# Patient Record
Sex: Female | Born: 1940 | Race: White | Hispanic: No | Marital: Single | Smoking: Never smoker
Health system: Southern US, Community
[De-identification: ages and names within clinical notes are randomized; demographics above are authoritative.]

## PROBLEM LIST (undated history)

## (undated) DIAGNOSIS — M654 Radial styloid tenosynovitis [de Quervain]: Secondary | ICD-10-CM

## (undated) DIAGNOSIS — E559 Vitamin D deficiency, unspecified: Secondary | ICD-10-CM

## (undated) DIAGNOSIS — E039 Hypothyroidism, unspecified: Secondary | ICD-10-CM

## (undated) DIAGNOSIS — F1910 Other psychoactive substance abuse, uncomplicated: Secondary | ICD-10-CM

## (undated) DIAGNOSIS — F329 Major depressive disorder, single episode, unspecified: Secondary | ICD-10-CM

## (undated) DIAGNOSIS — F32A Depression, unspecified: Secondary | ICD-10-CM

## (undated) DIAGNOSIS — K8020 Calculus of gallbladder without cholecystitis without obstruction: Secondary | ICD-10-CM

## (undated) DIAGNOSIS — I499 Cardiac arrhythmia, unspecified: Secondary | ICD-10-CM

## (undated) DIAGNOSIS — K5790 Diverticulosis of intestine, part unspecified, without perforation or abscess without bleeding: Secondary | ICD-10-CM

## (undated) DIAGNOSIS — I639 Cerebral infarction, unspecified: Secondary | ICD-10-CM

## (undated) DIAGNOSIS — C801 Malignant (primary) neoplasm, unspecified: Secondary | ICD-10-CM

## (undated) DIAGNOSIS — I10 Essential (primary) hypertension: Secondary | ICD-10-CM

## (undated) DIAGNOSIS — T8859XA Other complications of anesthesia, initial encounter: Secondary | ICD-10-CM

## (undated) DIAGNOSIS — R112 Nausea with vomiting, unspecified: Secondary | ICD-10-CM

## (undated) DIAGNOSIS — E079 Disorder of thyroid, unspecified: Secondary | ICD-10-CM

## (undated) DIAGNOSIS — E785 Hyperlipidemia, unspecified: Secondary | ICD-10-CM

## (undated) DIAGNOSIS — Z9889 Other specified postprocedural states: Secondary | ICD-10-CM

## (undated) DIAGNOSIS — Z8719 Personal history of other diseases of the digestive system: Secondary | ICD-10-CM

## (undated) HISTORY — DX: Depression, unspecified: F32.A

## (undated) HISTORY — DX: Personal history of other diseases of the digestive system: Z87.19

## (undated) HISTORY — DX: Malignant (primary) neoplasm, unspecified: C80.1

## (undated) HISTORY — DX: Essential (primary) hypertension: I10

## (undated) HISTORY — DX: Diverticulosis of intestine, part unspecified, without perforation or abscess without bleeding: K57.90

## (undated) HISTORY — DX: Radial styloid tenosynovitis (de quervain): M65.4

## (undated) HISTORY — DX: Calculus of gallbladder without cholecystitis without obstruction: K80.20

## (undated) HISTORY — DX: Disorder of thyroid, unspecified: E07.9

## (undated) HISTORY — DX: Major depressive disorder, single episode, unspecified: F32.9

## (undated) HISTORY — DX: Hyperlipidemia, unspecified: E78.5

## (undated) HISTORY — DX: Vitamin D deficiency, unspecified: E55.9

## (undated) HISTORY — DX: Other psychoactive substance abuse, uncomplicated: F19.10

## (undated) HISTORY — PX: TONSILLECTOMY: SUR1361

---
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
## 1972-11-09 HISTORY — PX: VAGINAL HYSTERECTOMY: SUR661

## 1992-11-09 HISTORY — PX: BREAST BIOPSY: SHX20

## 1992-11-09 HISTORY — PX: BREAST EXCISIONAL BIOPSY: SUR124

## 1996-09-09 ENCOUNTER — Encounter (INDEPENDENT_AMBULATORY_CARE_PROVIDER_SITE_OTHER): Payer: Self-pay | Admitting: *Deleted

## 1996-09-09 LAB — CONVERTED CEMR LAB

## 2004-07-18 ENCOUNTER — Ambulatory Visit: Payer: Self-pay | Admitting: Family Medicine

## 2004-08-20 ENCOUNTER — Encounter: Admission: RE | Admit: 2004-08-20 | Discharge: 2004-08-20 | Payer: Self-pay | Admitting: Sports Medicine

## 2005-01-19 ENCOUNTER — Ambulatory Visit: Payer: Self-pay | Admitting: Family Medicine

## 2005-11-05 ENCOUNTER — Ambulatory Visit: Payer: Self-pay | Admitting: Sports Medicine

## 2006-01-13 ENCOUNTER — Ambulatory Visit: Payer: Self-pay | Admitting: Family Medicine

## 2006-01-21 ENCOUNTER — Ambulatory Visit: Payer: Self-pay | Admitting: Family Medicine

## 2007-01-06 DIAGNOSIS — F329 Major depressive disorder, single episode, unspecified: Secondary | ICD-10-CM

## 2007-01-06 DIAGNOSIS — E039 Hypothyroidism, unspecified: Secondary | ICD-10-CM | POA: Insufficient documentation

## 2007-01-06 DIAGNOSIS — E7800 Pure hypercholesterolemia, unspecified: Secondary | ICD-10-CM | POA: Insufficient documentation

## 2007-01-07 ENCOUNTER — Encounter (INDEPENDENT_AMBULATORY_CARE_PROVIDER_SITE_OTHER): Payer: Self-pay | Admitting: *Deleted

## 2007-03-09 ENCOUNTER — Ambulatory Visit: Payer: Self-pay | Admitting: Family Medicine

## 2007-03-09 ENCOUNTER — Encounter: Payer: Self-pay | Admitting: Family Medicine

## 2007-03-09 DIAGNOSIS — J310 Chronic rhinitis: Secondary | ICD-10-CM

## 2007-03-09 LAB — CONVERTED CEMR LAB
ALT: 16 units/L (ref 0–35)
AST: 17 units/L (ref 0–37)
Albumin: 4.2 g/dL (ref 3.5–5.2)
Alkaline Phosphatase: 78 units/L (ref 39–117)
Bilirubin, Direct: 0.1 mg/dL (ref 0.0–0.3)
Indirect Bilirubin: 0.4 mg/dL (ref 0.0–0.9)
TSH: 0.22 microintl units/mL — ABNORMAL LOW (ref 0.350–5.50)
Total Bilirubin: 0.5 mg/dL (ref 0.3–1.2)
Total Protein: 6.6 g/dL (ref 6.0–8.3)

## 2007-03-10 ENCOUNTER — Encounter: Payer: Self-pay | Admitting: Family Medicine

## 2007-03-14 ENCOUNTER — Telehealth: Payer: Self-pay | Admitting: *Deleted

## 2007-03-21 ENCOUNTER — Telehealth: Payer: Self-pay | Admitting: *Deleted

## 2007-03-21 ENCOUNTER — Ambulatory Visit: Payer: Self-pay | Admitting: Family Medicine

## 2007-03-22 ENCOUNTER — Encounter: Admission: RE | Admit: 2007-03-22 | Discharge: 2007-03-22 | Payer: Self-pay | Admitting: Sports Medicine

## 2007-03-25 ENCOUNTER — Encounter: Payer: Self-pay | Admitting: Family Medicine

## 2007-05-17 ENCOUNTER — Telehealth: Payer: Self-pay | Admitting: *Deleted

## 2007-06-29 ENCOUNTER — Encounter: Payer: Self-pay | Admitting: Family Medicine

## 2007-06-29 ENCOUNTER — Ambulatory Visit: Payer: Self-pay | Admitting: Family Medicine

## 2007-06-29 LAB — CONVERTED CEMR LAB: TSH: 3.035 microintl units/mL (ref 0.350–5.50)

## 2007-07-07 ENCOUNTER — Encounter: Payer: Self-pay | Admitting: Family Medicine

## 2007-07-19 ENCOUNTER — Telehealth: Payer: Self-pay | Admitting: *Deleted

## 2007-07-27 ENCOUNTER — Telehealth: Payer: Self-pay | Admitting: Family Medicine

## 2007-08-16 ENCOUNTER — Telehealth: Payer: Self-pay | Admitting: Family Medicine

## 2007-11-28 ENCOUNTER — Telehealth: Payer: Self-pay | Admitting: Family Medicine

## 2008-04-09 ENCOUNTER — Ambulatory Visit: Payer: Self-pay | Admitting: Family Medicine

## 2008-04-09 ENCOUNTER — Telehealth: Payer: Self-pay | Admitting: *Deleted

## 2008-06-11 ENCOUNTER — Ambulatory Visit: Payer: Self-pay | Admitting: Family Medicine

## 2008-06-11 ENCOUNTER — Encounter (INDEPENDENT_AMBULATORY_CARE_PROVIDER_SITE_OTHER): Payer: Self-pay | Admitting: Family Medicine

## 2008-06-11 LAB — CONVERTED CEMR LAB
ALT: 10 units/L (ref 0–35)
AST: 14 units/L (ref 0–37)
Albumin: 4.4 g/dL (ref 3.5–5.2)
Alkaline Phosphatase: 90 units/L (ref 39–117)
BUN: 19 mg/dL (ref 6–23)
Basophils Absolute: 0 10*3/uL (ref 0.0–0.1)
Basophils Relative: 0 % (ref 0–1)
CO2: 24 meq/L (ref 19–32)
Calcium: 9.4 mg/dL (ref 8.4–10.5)
Chloride: 101 meq/L (ref 96–112)
Creatinine, Ser: 0.88 mg/dL (ref 0.40–1.20)
Eosinophils Absolute: 0.2 10*3/uL (ref 0.0–0.7)
Eosinophils Relative: 2 % (ref 0–5)
Glucose, Bld: 75 mg/dL (ref 70–99)
HCT: 43 % (ref 36.0–46.0)
Hemoglobin: 14 g/dL (ref 12.0–15.0)
Lymphocytes Relative: 14 % (ref 12–46)
Lymphs Abs: 1.5 10*3/uL (ref 0.7–4.0)
MCHC: 32.6 g/dL (ref 30.0–36.0)
MCV: 99.3 fL (ref 78.0–100.0)
Monocytes Absolute: 0.9 10*3/uL (ref 0.1–1.0)
Monocytes Relative: 9 % (ref 3–12)
Neutro Abs: 7.5 10*3/uL (ref 1.7–7.7)
Neutrophils Relative %: 75 % (ref 43–77)
Platelets: 325 10*3/uL (ref 150–400)
Potassium: 4.5 meq/L (ref 3.5–5.3)
RBC: 4.33 M/uL (ref 3.87–5.11)
RDW: 13.2 % (ref 11.5–15.5)
Sodium: 140 meq/L (ref 135–145)
Total Bilirubin: 1.1 mg/dL (ref 0.3–1.2)
Total Protein: 7.4 g/dL (ref 6.0–8.3)
WBC: 10.1 10*3/uL (ref 4.0–10.5)

## 2008-06-12 ENCOUNTER — Encounter: Admission: RE | Admit: 2008-06-12 | Discharge: 2008-06-12 | Payer: Self-pay | Admitting: Family Medicine

## 2008-06-15 ENCOUNTER — Ambulatory Visit: Payer: Self-pay | Admitting: Family Medicine

## 2008-06-27 ENCOUNTER — Ambulatory Visit: Payer: Self-pay | Admitting: Family Medicine

## 2008-06-27 DIAGNOSIS — Z8719 Personal history of other diseases of the digestive system: Secondary | ICD-10-CM

## 2008-06-27 HISTORY — DX: Personal history of other diseases of the digestive system: Z87.19

## 2008-11-12 ENCOUNTER — Telehealth (INDEPENDENT_AMBULATORY_CARE_PROVIDER_SITE_OTHER): Payer: Self-pay | Admitting: Family Medicine

## 2008-12-07 ENCOUNTER — Ambulatory Visit: Payer: Self-pay | Admitting: Family Medicine

## 2008-12-07 ENCOUNTER — Encounter: Payer: Self-pay | Admitting: Family Medicine

## 2008-12-07 DIAGNOSIS — R404 Transient alteration of awareness: Secondary | ICD-10-CM | POA: Insufficient documentation

## 2008-12-12 DIAGNOSIS — E559 Vitamin D deficiency, unspecified: Secondary | ICD-10-CM

## 2008-12-12 HISTORY — DX: Vitamin D deficiency, unspecified: E55.9

## 2008-12-13 LAB — CONVERTED CEMR LAB
BUN: 15 mg/dL (ref 6–23)
CO2: 22 meq/L (ref 19–32)
Calcium: 9.4 mg/dL (ref 8.4–10.5)
Chloride: 107 meq/L (ref 96–112)
Cholesterol: 196 mg/dL (ref 0–200)
Creatinine, Ser: 0.79 mg/dL (ref 0.40–1.20)
Glucose, Bld: 89 mg/dL (ref 70–99)
HDL: 65 mg/dL (ref >39)
LDL Cholesterol: 97 mg/dL (ref 0–99)
Potassium: 4.4 meq/L (ref 3.5–5.3)
Sodium: 141 meq/L (ref 135–145)
TSH: 1.358 microintl units/mL (ref 0.350–4.50)
Total CHOL/HDL Ratio: 3
Triglycerides: 171 mg/dL — ABNORMAL HIGH (ref <150)
VLDL: 34 mg/dL (ref 0–40)
Vit D, 1,25-Dihydroxy: 18 — ABNORMAL LOW (ref 30–89)

## 2008-12-27 ENCOUNTER — Encounter: Payer: Self-pay | Admitting: Family Medicine

## 2008-12-28 ENCOUNTER — Encounter: Admission: RE | Admit: 2008-12-28 | Discharge: 2008-12-28 | Payer: Self-pay | Admitting: Gastroenterology

## 2009-01-02 ENCOUNTER — Telehealth: Payer: Self-pay | Admitting: Family Medicine

## 2009-01-03 ENCOUNTER — Ambulatory Visit: Payer: Self-pay | Admitting: Family Medicine

## 2009-01-03 DIAGNOSIS — M67919 Unspecified disorder of synovium and tendon, unspecified shoulder: Secondary | ICD-10-CM | POA: Insufficient documentation

## 2009-01-03 DIAGNOSIS — M719 Bursopathy, unspecified: Secondary | ICD-10-CM

## 2009-02-19 ENCOUNTER — Telehealth: Payer: Self-pay | Admitting: *Deleted

## 2009-03-11 ENCOUNTER — Encounter: Payer: Self-pay | Admitting: Family Medicine

## 2009-03-11 ENCOUNTER — Ambulatory Visit: Payer: Self-pay | Admitting: Family Medicine

## 2009-03-11 DIAGNOSIS — E663 Overweight: Secondary | ICD-10-CM | POA: Insufficient documentation

## 2009-03-11 LAB — CONVERTED CEMR LAB: Vit D, 25-Hydroxy: 39 ng/mL (ref 30–89)

## 2009-03-12 ENCOUNTER — Encounter: Payer: Self-pay | Admitting: Family Medicine

## 2009-03-13 ENCOUNTER — Encounter: Payer: Self-pay | Admitting: Family Medicine

## 2009-04-10 ENCOUNTER — Telehealth: Payer: Self-pay | Admitting: *Deleted

## 2009-04-23 ENCOUNTER — Telehealth: Payer: Self-pay | Admitting: *Deleted

## 2010-01-31 ENCOUNTER — Ambulatory Visit: Payer: Self-pay | Admitting: Family Medicine

## 2010-02-01 ENCOUNTER — Telehealth: Payer: Self-pay | Admitting: Family Medicine

## 2010-02-03 ENCOUNTER — Ambulatory Visit: Payer: Self-pay | Admitting: Family Medicine

## 2010-02-03 ENCOUNTER — Encounter: Payer: Self-pay | Admitting: Family Medicine

## 2010-02-05 LAB — CONVERTED CEMR LAB
ALT: 20 units/L (ref 0–35)
AST: 19 units/L (ref 0–37)
Albumin: 4.4 g/dL (ref 3.5–5.2)
Alkaline Phosphatase: 75 units/L (ref 39–117)
BUN: 15 mg/dL (ref 6–23)
CO2: 21 meq/L (ref 19–32)
Calcium: 9.8 mg/dL (ref 8.4–10.5)
Chloride: 105 meq/L (ref 96–112)
Cholesterol: 226 mg/dL — ABNORMAL HIGH (ref 0–200)
Creatinine, Ser: 0.87 mg/dL (ref 0.40–1.20)
Glucose, Bld: 90 mg/dL (ref 70–99)
HDL: 56 mg/dL (ref >39)
LDL Cholesterol: 139 mg/dL — ABNORMAL HIGH (ref 0–99)
Potassium: 4.3 meq/L (ref 3.5–5.3)
Sodium: 139 meq/L (ref 135–145)
TSH: 4.543 microintl units/mL — ABNORMAL HIGH (ref 0.350–4.500)
Total Bilirubin: 0.9 mg/dL (ref 0.3–1.2)
Total CHOL/HDL Ratio: 4
Total Protein: 6.7 g/dL (ref 6.0–8.3)
Triglycerides: 157 mg/dL — ABNORMAL HIGH (ref <150)
VLDL: 31 mg/dL (ref 0–40)

## 2010-02-10 ENCOUNTER — Telehealth: Payer: Self-pay | Admitting: Family Medicine

## 2010-04-24 ENCOUNTER — Telehealth: Payer: Self-pay | Admitting: *Deleted

## 2010-05-14 ENCOUNTER — Encounter: Admission: RE | Admit: 2010-05-14 | Discharge: 2010-05-14 | Payer: Self-pay | Admitting: Family Medicine

## 2010-05-29 ENCOUNTER — Encounter: Payer: Self-pay | Admitting: Family Medicine

## 2010-05-29 ENCOUNTER — Ambulatory Visit: Payer: Self-pay | Admitting: Family Medicine

## 2010-05-30 LAB — CONVERTED CEMR LAB: TSH: 0.684 microintl units/mL (ref 0.350–4.500)

## 2010-06-12 ENCOUNTER — Telehealth: Payer: Self-pay | Admitting: Family Medicine

## 2010-06-20 ENCOUNTER — Telehealth: Payer: Self-pay | Admitting: Family Medicine

## 2010-07-24 ENCOUNTER — Telehealth: Payer: Self-pay | Admitting: Family Medicine

## 2010-08-05 ENCOUNTER — Ambulatory Visit: Payer: Self-pay | Admitting: Family Medicine

## 2010-08-05 DIAGNOSIS — R5381 Other malaise: Secondary | ICD-10-CM

## 2010-08-05 DIAGNOSIS — R5383 Other fatigue: Secondary | ICD-10-CM

## 2010-08-11 ENCOUNTER — Telehealth: Payer: Self-pay | Admitting: Family Medicine

## 2010-08-28 ENCOUNTER — Telehealth: Payer: Self-pay | Admitting: Family Medicine

## 2010-09-02 ENCOUNTER — Telehealth: Payer: Self-pay | Admitting: Psychology

## 2010-09-17 ENCOUNTER — Encounter: Payer: Self-pay | Admitting: Family Medicine

## 2010-09-17 ENCOUNTER — Ambulatory Visit: Payer: Self-pay | Admitting: Family Medicine

## 2010-09-17 LAB — CONVERTED CEMR LAB
Direct LDL: 118 mg/dL — ABNORMAL HIGH
TSH: 3.096 microintl units/mL (ref 0.350–4.500)

## 2010-10-20 ENCOUNTER — Ambulatory Visit: Payer: Self-pay | Admitting: Family Medicine

## 2010-11-18 ENCOUNTER — Telehealth: Payer: Self-pay | Admitting: Family Medicine

## 2010-12-02 ENCOUNTER — Encounter (INDEPENDENT_AMBULATORY_CARE_PROVIDER_SITE_OTHER): Payer: Self-pay | Admitting: *Deleted

## 2010-12-10 NOTE — Assessment & Plan Note (Signed)
Summary: f/up,tcb   Vital Signs:  Patient profile:   70 year old female Height:      65.75 inches Weight:      181.1 pounds BMI:     29.56 Temp:     98.3 degrees F oral Pulse rate:   71 / minute BP sitting:   122 / 75  (left arm) Cuff size:   regular  Vitals Entered By: Garen Grams LPN (September 17, 2010 11:05 AM) CC: f/u depression Is Patient Diabetic? No Pain Assessment Patient in pain? no        Primary Care Provider:  Antoine Primas DO  CC:  f/u depression.  History of Present Illness: Pt is here for f/u on  1. Depression: Pt states feelings of hoplesness, tearfulness, not enjoying things they used to, being more detached from family and friend, insomnia, trouble focusing, and overall felling of fatigue.  Pt has been on celexa at max dose and only a mild improvement.  Denies SI, HI Taking Medications:celexa Side Effects:no  Overall:no improvement Going to therapy: Pt has started going to see Phylliss Blakes and is doing well, see her weekly and enjoys her time there.   2. hypothyroidsim:  Pt has been on syntrhoid for a while, no side effects.  Has had a little more fatugue but not sleepying as much as last time, denies hair loss or edema, but dopes state trouble concentrating and weight gain.  3.  Hypercholesterolemia-  Pt had statin dose change and is time to check again.  Pt states no side effects, take it nightly.     Habits & Providers  Alcohol-Tobacco-Diet     Tobacco Status: never  Current Medications (verified): 1)  Celexa 20 Mg Tabs (Citalopram Hydrobromide) .... Take 1 Tablet Daily For The Next Week Then Stop. 2)  Levothroid 112 Mcg Tabs (Levothyroxine Sodium) .... One By Mouth Daily For Thyroid 3)  Zocor 40 Mg Tabs (Simvastatin) .... One By Mouth Daily For Cholesterol 4)  Adult Aspirin Low Strength 81 Mg  Tbdp (Aspirin) 5)  Caltrate 600+d Plus 600-400 Mg-Unit Tabs (Calcium Carbonate-Vit D-Min) .... 2 Tabs By Mouth Daily 6)  Venlafaxine Hcl 37.5 Mg Xr24h-Cap  (Venlafaxine Hcl) .... Take 2 Tabs By Mouth Daily For The Next Week Then 4 Tabs Daily Thereafter.  Allergies (verified): No Known Drug Allergies  Past History:  Past medical, surgical, family and social histories (including risk factors) reviewed, and no changes noted (except as noted below).  Past Medical History: Reviewed history from 01/03/2009 and no changes required. Depression dx`ed 1990; tried Zoloft initially,  Diverticulosis w/ h/o diverticulitis 1998, H/o cholelithiasis, Hiatal hernia incidentally found on CT (2/98) Diverticulitis 8/09  colonoscopy 2/10 - one sessile polyp removed - plan repeat scope in 5 years.   had bone density scan per her report with [facility] weight study in 2007...states this was normal.  Past Surgical History: Reviewed history from 01/06/2007 and no changes required. Appendectomy (1952) -, Breast biopsy: no malignancy - 07/10/1993, Tonsillectomy (?1946) -, TSH = 0.472; FBS = 74; Cr = 0.9 - 01/21/2005, Vag hysterectomy for fibroids (1975) -  Family History: Reviewed history from 01/31/2010 and no changes required. Brother--3 years younger; Hep C, smoker, Daughter--hypoglycemia, Father--died at 67; smoker; alcoholic; CVAs, MIs when elderly, Maternal aunt--DM, Mother--died at 22 of CHF; smoker; alcoholic, Son--alcoholic, Uncle--suicide son with congenital hyperbilirubinemia. PGM with ? 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  Social History: Reviewed history from 05/29/2010 and no changes required. 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  Eyes:  PERRL, EOMI.  Mouth:  oropharynx pink, moist; no erythema or exudate  Lungs:  work of breathing unlabored, clear to auscultation bilaterally; no wheezes, rales, or ronchi; good air movement throughout  Heart:  regular rate and rhythm, no murmurs; normal s1/s2  Abdomen:  +BS, soft, non-tender, non-distended; no masses; no rebound or guarding  Msk:  no joint swelling; no crepitus with active/passive movement of knees. Transitions are easy and smooth Pulses:  2+ DP and radial pulses Extremities:  no cyanosis, clubbing, or edema  Neurologic:  alert and oriented. speech normal. station and gait normal. no gross deficitis.    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  Will titrate up while titrate down celexa at same time.  Pt understands.  Pt wants to wait to get med from [facility] and then will startr new regimen. Pt is to see me again 3 weeks after starting regimen, gave red flags to look out for.  Her updated medication list for this problem includes:    Celexa 20 Mg Tabs (Citalopram hydrobromide) .Marland Kitchen... Take 1 tablet daily for the next week then stop.    Venlafaxine Hcl 37.5 Mg Xr24h-cap (Venlafaxine hcl) .Marland Kitchen... Take 2 tabs by mouth daily for the next week then 4 tabs daily thereafter.  Orders: [facility]- Est  Level 4 (91478)  Problem # 2:  HYPOTHYROIDISM, UNSPECIFIED (ICD-244.9) will check TSh.  Her updated medication list for this problem includes:    Levothroid 112 Mcg Tabs (Levothyroxine sodium) ..... One by mouth daily for thyroid  Orders: [facility]- Est  Level 4  (99214) TSH-[facility] (29562-13086)  Problem # 3:  HYPERCHOLESTEROLEMIA (ICD-272.0) will get direct LDL. liekly at proper dose.  Her updated medication list for this problem includes:    Zocor 40 Mg Tabs (Simvastatin) ..... One by mouth daily for cholesterol  Orders: [facility]- Est  Level 4 (57846) Direct LDL-[facility] (96295-28413)  Complete Medication List: 1)  Celexa 20 Mg Tabs (Citalopram hydrobromide) .... Take 1 tablet daily for the next week then stop. 2)  Levothroid 112 Mcg Tabs (Levothyroxine sodium) .... One by mouth daily for thyroid 3)  Zocor 40 Mg Tabs (Simvastatin) .... One by mouth daily for cholesterol 4)  Adult Aspirin Low Strength 81 Mg Tbdp (Aspirin) 5)  Caltrate 600+d Plus 600-400 Mg-unit Tabs (Calcium carbonate-vit d-min) .... 2 tabs by mouth daily 6)  Venlafaxine Hcl 37.5 Mg Xr24h-cap (Venlafaxine hcl) .... Take 2 tabs by mouth daily for the next week then 4 tabs daily thereafter.  Patient Instructions: 1)  I am glad you are doing a little better but it seems you are not too happy with the celexa. 2)  We will switch you to effexor.  Take 2  tabs (75mg ) daily for the next week then take 4 tablets (150mg )  thereafter. 3)  I want you to take just one celexa daily for the next week, then stop completely. 4)  We will check your LDL cholesterol and thyroid today I will call you with the results.  5)  I need to see you again in 3-4 weeks.   Prescriptions: VENLAFAXINE HCL 37.5 MG XR24H-CAP (VENLAFAXINE HCL) take 2 tabs by mouth daily for the next week then 4 tabs daily thereafter.  #124 x 1   Entered and Authorized by:   Antoine Primas DO   Signed by:   Antoine Primas DO on 09/17/2010   Method used:   Faxed to ...       [facility] (mail-order)             , [place]         Ph: [phone]       Fax: [phone]   RxID:   [id number]    Orders Added: 1)  [facility]- Est  Level 4 [01027] 2)  Direct LDL-[facility] [[id number]] 3)  TSH-[facility] [[id number]]

## 2010-12-10 NOTE — Assessment & Plan Note (Signed)
Summary: increase meds?,df   Vital Signs:  Patient profile:   70 year old female Height:      65.75 inches Weight:      183 pounds BMI:     29.87 Temp:     98.3 degrees F oral Pulse rate:   67 / minute BP sitting:   141 / 89  (left arm) Cuff size:   regular  Vitals Entered By: Garen Grams LPN (August 05, 2010 9:24 AM) CC: change meds Is Patient Diabetic? No Pain Assessment Patient in pain? no        Primary Care Provider:  Antoine Primas DO  CC:  change meds.  History of Present Illness: 70 yo female here for f/u with multiple problems  1 Depression-  Pt has been on Celexa since joining our practice and has been doing very well but since last visit and decreasing dose to 20 mg seems to be much more depressed.  Pt really wanted to be off medication but ddoes not think it is possible at this time. Pt states she has been spending a lot more time in bed recently and not doing the household chores like she usually does.  She always says to herself tat she will do them later. Pt always knows when she is feeling depressed because she sleep a lot more. Pt denies SI HI.  Pt will from time to tim have overwhelming feeling of sadness but doesn't cry much.    2. Overweight-  Has not ben working out due to pt not feeling good and sad.  Pt has not been going to the [facility].  Pt would like to lose some weight and thinks if we can help her mood she will do better.     Habits & Providers  Alcohol-Tobacco-Diet     Tobacco Status: never  Current Medications (verified): 1)  Celexa 20 Mg Tabs (Citalopram Hydrobromide) .... Take 2 Tablet By Mouth Daily 2)  Levothroid 112 Mcg Tabs (Levothyroxine Sodium) .... One By Mouth Daily For Thyroid 3)  Zocor 40 Mg Tabs (Simvastatin) .... One By Mouth Daily For Cholesterol 4)  Adult Aspirin Low Strength 81 Mg  Tbdp (Aspirin) 5)  Caltrate 600+d Plus 600-400 Mg-Unit Tabs (Calcium Carbonate-Vit D-Min) .... 2 Tabs By Mouth Daily  Allergies (verified): No  Known Drug Allergies  Review of Systems       see hpi denies fever, chills, nausea, vomiting, diarrhea or constipation   Physical Exam  General:  Vital signs reviewed -- overweight but otherwise normal Alert, appropriate; well-dressed and well-nourished  tearful Eyes:  PERRL, EOMI,some erythema of the conjunctiva due to tearfulness.  Mouth:  oropharynx pink, moist; no erythema or exudate  Lungs:  work of breathing unlabored, clear to auscultation bilaterally; no wheezes, rales, or ronchi; good air movement throughout  Heart:  regular rate and rhythm, no murmurs; normal s1/s2  Abdomen:  +BS, soft, non-tender, non-distended; no masses; no rebound or guarding  Pulses:  2+ DP and radial pulses Extremities:  no cyanosis, clubbing, or edema  Psych:  good insight sadness, poor body language, does seem to have some concentration problem which is new for pt.    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 Pt does not have  any suicidal ideation, Homicidal ideation ideation does have family hx of a uncle who comitted suicide and states she  would never do that to her family but in the same breath states that she does not have any close family.   this time of year always seems hard for her. Will monitor closely told her that if she had any ideas of hurting herself to return immediately. If pt is not better at next visit would change her to effexor and titrate up to a sizeable dose. Do feel there is a anxxiety component to it. Pt was given Dr. Carola Rhine card if she wants to make an appointment. Pt stated she did ot think she would at this time.  Her updated medication list for this problem includes:    Celexa 20 Mg Tabs (Citalopram hydrobromide) .Marland Kitchen... Take 2 tablet by mouth daily  Orders: [facility]- Est  Level 4 (62952)  Problem # 2:  OVERWEIGHT  (ICD-278.02) Likely due to the depression haas not felt liek doing anything. Will montior but not main issue today. Effexor may be a better otpion as well . Orders: [facility]- Est  Level 4 (99214)  Problem # 3:  FATIGUE (ICD-780.79)  Complete Medication List: 1)  Celexa 20 Mg Tabs (Citalopram hydrobromide) .... Take 2 tablet by mouth daily 2)  Levothroid 112 Mcg Tabs (Levothyroxine sodium) .... One by mouth daily for thyroid 3)  Zocor 40 Mg Tabs (Simvastatin) .... One by mouth daily for cholesterol 4)  Adult Aspirin Low Strength 81 Mg Tbdp (Aspirin) 5)  Caltrate 600+d Plus 600-400 Mg-unit Tabs (Calcium carbonate-vit d-min) .... 2 tabs by mouth daily  Patient Instructions: 1)  I wnat you to increase your celexa to 2 pills daily 2)  I want to see you again in 2 weeks 3)  If you need anything before that please do not hesitate to call.  Prescriptions: CELEXA 20 MG TABS (CITALOPRAM HYDROBROMIDE) take 2 tablet by mouth daily  #62 x 3   Entered and Authorized by:   Antoine Primas DO   Signed by:   Antoine Primas DO on 08/05/2010   Method used:   Faxed to ...       [facility] (mail-order)       [address]       [place], [place]  [address]       Ph: [phone]       Fax: [phone]   RxID:   [id number]

## 2010-12-10 NOTE — Assessment & Plan Note (Signed)
Summary: cpe,df   Vital Signs:  Patient profile:   70 year old female Height:      65.75 inches Weight:      184.1 pounds BMI:     30.05 Temp:     98.2 degrees F oral Pulse rate:   65 / minute BP sitting:   131 / 79  (right arm) Cuff size:   regular  Vitals Entered By: Garen Grams LPN (January 31, 2010 9:28 AM) CC: CPE Is Patient Diabetic? No Pain Assessment Patient in pain? no        Primary Care Provider:  Myrtie Soman  MD  CC:  CPE.  History of Present Illness: 1. depression States that mood is doing well. No SI. Has had several occurences of cancer diagnosis among her family and friends -- all either friends or in-laws. States that this has been hard but she is dealing with them okay.   2. vision States that her night vision problems are resolved. Had a normal eye exam by [facility].  3. prevention Weight is up 5 lbs. Plans to start working out and eating better. Admits that she likes to eat and doesn't like to exercise. But she is willing to try to improve this.   Due for mammogram -- willing to get this.  Had dexa scan in 2004. Was told that it was normal. Would be willing to get another one. Has not been taking Ca/Vit D regularly.  Has not been taking ASA 81 mg regularly.  Pt has 112 micrograms synthroid on her list -- we have 100 micrograms on ours -- she plans to check this and call us back with the right dose.   Habits & Providers  Alcohol-Tobacco-Diet     Tobacco Status: never  Current Medications (verified): 1)  Celexa 20 Mg Tabs (Citalopram Hydrobromide) .... Take 1 Tablet By Mouth Once A Day 2)  Levothroid 100 Mcg  Tabs (Levothyroxine Sodium) 3)  Zocor 20 Mg Tabs (Simvastatin) .Marland Kitchen.. 1 Tablet By Mouth At Bedtime 4)  Adult Aspirin Low Strength 81 Mg  Tbdp (Aspirin) 5)  Caltrate 600+d Plus 600-400 Mg-Unit Tabs (Calcium Carbonate-Vit D-Min) .... 2 Tabs By Mouth Daily  Allergies (verified): No Known Drug Allergies  Family History: Brother--3  years younger; Hep C, smoker, Daughter--hypoglycemia, Father--died at 17; smoker; alcoholic; CVAs, MIs when elderly, Maternal aunt--DM, Mother--died at 11 of CHF; smoker; alcoholic, Son--alcoholic, Uncle--suicide son with congenital hyperbilirubinemia. PGM with ? 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  Review of Systems  The patient denies anorexia, fever, weight loss, vision loss, chest pain, syncope, dyspnea on exertion, headaches, and abdominal pain.         no nausea, vomting, or change in bowel habits. No joint swelling or joint aches.   Physical Exam  General:  Vital signs reviewed -- overweight but otherwise normal Alert, appropriate; well-dressed and well-nourished  Eyes:  PERRL, EOMI, fundoscopic exam unremarkable  Mouth:  oropharynx pink, moist; no erythema or exudate  Neck:  no carotid bruits, no JVD, no tenderness or masses  Lungs:  work of breathing unlabored, clear to auscultation bilaterally; no wheezes, rales, or ronchi; good air movement throughout  Heart:  regular rate and rhythm, no murmurs; normal s1/s2  Abdomen:  +BS, soft, non-tender, non-distended; no masses; no rebound or guarding  Genitalia:  pt defers Msk:  no joint swelling; no crepitus with active/passive movement of knees. Transitions  are easy and smooth Pulses:  2+ DP and radial pulses Extremities:  no cyanosis, clubbing, or edema  Neurologic:  alert and oriented. speech normal. station and gait normal. no gross deficitis.  Skin:  warm, good turgor; no rashes or lesions. brisk cap refill  Psych:  alert and oriented. full affect, normally interactive. Good eye contact.    Impression & Recommendations:  Problem # 1:  Preventive Health Care (ICD-V70.0) Assessment Unchanged  Pt doing well. Pt to call to schedule mammogram and dexa scan. Advised restarted ASA 81 mg and Ca/Vit D regularly. Have  back for routine labs (lipids, CMET, TSH). Pt to call about her thyroid dose. She has 112 micrograms and my records show 100 micrograms. Encouraged more exercise and weight loss.   Overall doing quite well.   Orders: [facility] - Est  65+ [phone])  Complete Medication List: 1)  Celexa 20 Mg Tabs (Citalopram hydrobromide) .... Take 1 tablet by mouth once a day 2)  Levothroid 100 Mcg Tabs (Levothyroxine sodium) 3)  Zocor 20 Mg Tabs (Simvastatin) .Marland Kitchen.. 1 tablet by mouth at bedtime 4)  Adult Aspirin Low Strength 81 Mg Tbdp (Aspirin) 5)  Caltrate 600+d Plus 600-400 Mg-unit Tabs (Calcium carbonate-vit d-min) .... 2 tabs by mouth daily  Other Orders: Future Orders: Comp Met-[facility] ([id number]) ... 02/01/2011 Lipid-[facility] ([id number]) ... 02/01/2011 TSH-[facility] [id number]) ... 02/01/2011  Patient Instructions: 1)  start taking Aspirin 81 mg daily  2)  start calcium/vitamin D 400/600 mg (2 tablets a day) 3)  let us know about the synthroid dose 4)  I would recommend a bone density scan and a mammogram 5)  Call with any questions or concerns

## 2010-12-10 NOTE — Progress Notes (Signed)
Summary: Mental health referral  Phone Note Call from Patient   Caller: Patient Call For: Spero Geralds, Psy.D. Summary of Call: Patient called to schedule an appt.  She is only able to come in the morning because she picks her granddaughter up from school in the afternoon.  Discussed options.  Elected to accept a referral to [facility] [phone]).  Would encourage communication between [facility] and Dr. Katrinka Blazing via a release of information. Initial call taken by: Spero Geralds PsyD,  September 02, 2010 1:25 PM

## 2010-12-10 NOTE — Progress Notes (Signed)
Summary: meds change  Phone Note Refill Request Call back at Home Phone [phone] Message from:  Patient  Refills Requested: Medication #1:  CELEXA 20 MG TABS take 1 tablet by mouth daily   Notes: pt feels like she needs to increase to 30 mg- has about a month supply and would like to increase at next refill [facility]  Initial call taken by: De Nurse,  July 24, 2010 8:59 AM  Follow-up for Phone Call        sorry but needs to be seen if she feels that her depression is getting worse. Please call pt and tell her to make an appointment.  Follow-up by: Antoine Primas DO,  July 24, 2010 9:46 AM  Additional Follow-up for Phone Call Additional follow up Details #1::        LVM for pt to call back. Additional Follow-up by: De Nurse,  July 29, 2010 2:16 PM    Additional Follow-up for Phone Call Additional follow up Details #2::    LM for pt telling her would like to see her before changing medication. Follow-up by: Antoine Primas DO,  July 30, 2010 8:24 AM

## 2010-12-10 NOTE — Progress Notes (Signed)
Summary: needs orders  Phone Note Call from Patient   Caller: Patient Summary of Call: needs orders for bone density at [facility] - already has appt 7/6 @ 1:20 Initial call taken by: De Nurse,  April 24, 2010 9:31 AM  Follow-up for Phone Call        will forward  to MD. Follow-up by: Theresia Lo RN,  April 24, 2010 9:36 AM  Additional Follow-up for Phone Call Additional follow up Details #1::        I don't  think I usually put in an order for this but will do so. Please fax over to breast center and advise patient that this has been done. thanks. Additional Follow-up by: Myrtie Soman  MD,  April 30, 2010 8:56 AM  New Problems: ? OSTEOPOROSIS (ICD-733.00)   Additional Follow-up for Phone Call Additional follow up Details #2::    Order faxed, patient informed. Follow-up by: Garen Grams LPN,  April 30, 2010 10:58 AM  New Problems: ? OSTEOPOROSIS (ICD-733.00)

## 2010-12-10 NOTE — Progress Notes (Signed)
Summary: Rx Prob  Phone Note Call from Patient Call back at Home Phone [phone]   Caller: Patient Summary of Call: Pt states that she was to have 3 rxs sent to [facility] and she is checking to see if all three were sent.   Initial call taken by: Clydell Hakim,  June 12, 2010 2:46 PM  Follow-up for Phone Call        patient states she is to get  90 day supply of simvastatin because costs less if she gets a 90 day supply. will ask MD to resend.  also advised that we have sent in two meds . she states the 3rd was celexa and she actually has gotten this med.  she has not received the rx sent 06/05/2010. will call tomorrow to ask about.  Follow-up by: Theresia Lo RN,  June 12, 2010 5:41 PM  Additional Follow-up for Phone Call Additional follow up Details #1::        called  mail order pharmacy and the rx have been received and shipped. will ask MD about simvastatin to ask if he will ok #90 day supply for next time she needs refilled. Additional Follow-up by: Theresia Lo RN,  June 13, 2010 2:07 PM    Additional Follow-up for Phone Call Additional follow up Details #2::    sent in rx Follow-up by: Antoine Primas DO,  June 14, 2010 8:10 AM  Prescriptions: ZOCOR 40 MG TABS (SIMVASTATIN) one by mouth daily for cholesterol  #91 Tablet x 1   Entered and Authorized by:   Antoine Primas DO   Signed by:   Antoine Primas DO on 06/14/2010   Method used:   Faxed to ...       [facility] (mail-order)             , [place]         Ph: [phone]       Fax: [phone]   RxID:   [id number]

## 2010-12-10 NOTE — Assessment & Plan Note (Signed)
Summary: SHINGLES VACCINE/KH  Nurse Visit  patient in today for shingles vaccine . explained  to patient however that she had a shingles vaccine   12/07/2009. she does not remember this. explained that it is charted with lot # and expiration date for the vaccine. Theresia Lo RN  February 03, 2010 9:45 AM   Allergies: No Known Drug Allergies  Orders Added: 1)  No Charge Patient Arrived (NCPA0) [NCPA0]  Appended Document: SHINGLES VACCINE/KH the shingles vaccine  date above was entered incorrectly. it was given 12/07/2008 and not 12/07/2009.

## 2010-12-10 NOTE — Assessment & Plan Note (Signed)
Summary: Hypothyroidism, overwieght, depression   Vital Signs:  Patient profile:   70 year old female Height:      65.75 inches Weight:      184 pounds BMI:     30.03 BSA:     1.93 Temp:     98.4 degrees F Pulse rate:   69 / minute BP sitting:   136 / 89  Vitals Entered By: Jone Baseman CMA (May 29, 2010 9:40 AM) CC: f/u bloodwork Is Patient Diabetic? No Pain Assessment Patient in pain? no        Primary Care Provider:  Antoine Primas DO  CC:  f/u bloodwork.  History of Present Illness: 70 yo female here for f/u with multiple problems  1.  Hypothyroid-  pt has been taking synthroid for quite awhile.  Pt states that she went up on he rdose last time they checked her TSH.  Pt states that since then she has been doing relatively well with increase in energy but denies any anxiety, diarrhea, hair loss cp, sob. Pt  states she thinks it is a good dose.   2.  Depression-  Pt has been on Celexa since joining our practice and has been doing very well denies any hoplessness, eating troubles, excessive sleepiness, and staes she never really got ssad when she was depressed only fatigue and not wanting to do things she used to enjoy. Pt htough now is feeling great and is wondering if we can attempt to come off the medicine.  Pt would like to at least come down in dosing some eventhough she thinks the medicine is very well tolerated.   3.  Hypercholesterolemia-  On statin doing well checked last in 3/11 with LDL of 139.  No hx of DM.  Pt was at goal  4. Overweight-  pt states that for the last 2 weeks has been working out daily at [employer] and enjoying it.  Felels that if she gets her weight under control maybe she can stop some of her medications which is a goal for her. wt today 184.    Habits & Providers  Alcohol-Tobacco-Diet     Tobacco Status: never  Current Medications (verified): 1)  Celexa 20 Mg Tabs (Citalopram Hydrobromide) .... Take 1 Tablet By Mouth Daily 2)  Levothroid  112 Mcg Tabs (Levothyroxine Sodium) .... One By Mouth Daily For Thyroid 3)  Zocor 40 Mg Tabs (Simvastatin) .... One By Mouth Daily For Cholesterol 4)  Adult Aspirin Low Strength 81 Mg  Tbdp (Aspirin) 5)  Caltrate 600+d Plus 600-400 Mg-Unit Tabs (Calcium Carbonate-Vit D-Min) .... 2 Tabs By Mouth Daily  Allergies (verified): No Known Drug Allergies  Past History:  Past medical, surgical, family and social histories (including risk factors) reviewed, and no changes noted (except as noted below).  Past Medical History: Reviewed history from 01/03/2009 and no changes required. Depression dx`ed 1990; tried Zoloft initially,  Diverticulosis w/ h/o diverticulitis 1998, H/o cholelithiasis, Hiatal hernia incidentally found on CT (2/98) Diverticulitis 8/09  colonoscopy 2/10 - one sessile polyp removed - plan repeat scope in 5 years.   had bone density scan per her report with [facility] weight study in 2007...states this was normal.  Past Surgical History: Reviewed history from 01/06/2007 and no changes required. Appendectomy (1952) -, Breast biopsy: no malignancy - 07/10/1993, Tonsillectomy (?1946) -, TSH = 0.472; FBS = 74; Cr = 0.9 - 01/21/2005, Vag hysterectomy for fibroids (1975) -  Family History: Reviewed history from 01/31/2010 and no changes required. Brother--3  years younger; Hep C, smoker, Daughter--hypoglycemia, Father--died at 29; smoker; alcoholic; CVAs, MIs when elderly, Maternal aunt--DM, Mother--died at 101 of CHF; smoker; alcoholic, Son--alcoholic, Uncle--suicide son with congenital hyperbilirubinemia. PGM with ? 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  Social History: Reviewed history from 12/07/2008 and no changes required. 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); -Denies tobacco use; -Drinks 2 glasses of Chardonnay 4 days/wk + 1 cocktail q mos; started exercising more -Protestant  Review of Systems       denies fever, chills, nausea, vomiting, diarrhea or constipation   Physical Exam  General:  Vital signs reviewed -- overweight but otherwise normal Alert, appropriate; well-dressed and well-nourished  Eyes:  PERRL, EOMI, fundoscopic exam unremarkable  Mouth:  oropharynx pink, moist; no erythema or exudate  Lungs:  work of breathing unlabored, clear to auscultation bilaterally; no wheezes, rales, or ronchi; good air movement throughout  Heart:  regular rate and rhythm, no murmurs; normal s1/s2  Abdomen:  +BS, soft, non-tender, non-distended; no masses; no rebound or guarding  Msk:  no joint swelling; no crepitus with active/passive movement of knees. Transitions are easy and smooth Pulses:  2+ DP and radial pulses Extremities:  no cyanosis, clubbing, or edema  Neurologic:  alert and oriented. speech normal. station and gait normal. no gross deficitis.    Impression & Recommendations:  Problem # 1:  HYPOTHYROIDISM, UNSPECIFIED (ICD-244.9)  Will check TSH again today to see if we had improvement.  Likely also root issue of pt cholesterol.  Pt seems to have good energy doing well, no side effects.  Will titrate as needed  Her updated medication list for this problem includes:    Levothroid 112 Mcg Tabs (Levothyroxine sodium) ..... One by mouth daily for thyroid    Her updated medication list for this problem includes:    Levothroid 112 Mcg Tabs (Levothyroxine sodium) ..... One by mouth daily for thyroid  Orders: [facility]SH-[facility] (13244-01027) [facility]- Est  Level 4 (25366)  Labs Reviewed: TSH: 4.543 (02/03/2010)    Chol: 226 (02/03/2010)   HDL: 56 (02/03/2010)   LDL: 139 (02/03/2010)   TG: 157 (02/03/2010)  Problem # 2:  OVERWEIGHT  (ICD-278.02) Pt is 184 today Goal for next visit in 2 months s 180 or less for sure. Pt is motivated working out talked about food choices. Talked about ETOH again and how there are empty calories as well.  Orders: [facility]- Est  Level 4 ([address]214)  Problem # 3:  HYPERCHOLESTEROLEMIA (ICD-272.0) Doing well, if continue to lose weight will check again in 6 months and see if can titrate medicine down per pt wishes Her updated medication list for this problem includes:    Zocor 40 Mg Tabs (Simvastatin) ..... One by mouth daily for cholesterol  Labs Reviewed: SGOT: 19 (02/03/2010)   SGPT: 20 (02/03/2010)   HDL:56 (02/03/2010), 65 (12/07/2008)  LDL:139 (02/03/2010), 97 (44/01/4741)  Chol:226 (02/03/2010), 196 (12/07/2008)  Trig:157 (02/03/2010), 171 (12/07/2008)  Problem # 4:  DEPRESSIVE DISORDER, NOS (ICD-311) Will decrease back to one tab daily see if any signs of depression occur again.  If not will continue to titrae as tolerated.  Pt also would like to discontinue this medicine if possible in  the future.  Her updated medication list for this problem includes:    Celexa 20 Mg Tabs (Citalopram hydrobromide) .Marland Kitchen... Take 1 tablet by mouth daily  Orders: [facility]- Est  Level 4 ([address]214)  Complete Medication List: 1)  Celexa 20 Mg Tabs (Citalopram hydrobromide) .... Take 1 tablet by mouth daily 2)  Levothroid 112 Mcg Tabs (Levothyroxine sodium) .... One by mouth daily for thyroid 3)  Zocor 40 Mg Tabs (Simvastatin) .... One by mouth daily for cholesterol 4)  Adult Aspirin Low Strength 81 Mg Tbdp (Aspirin) 5)  Caltrate 600+d Plus 600-400 Mg-unit Tabs (Calcium carbonate-vit d-min) .... 2 tabs by mouth daily  Patient Instructions: 1)  Nice to meet you 2)  I am going to check your thyroid today.  I will call you with the results. 3)  I want you to decrease your celexa to 1 pill daily 4)  I want you to continue to exercise, keep up the good work.  5)  Try taking the calcium and the aspirin daily.  I don not  think any interaction with your other medication is of concern. 6)  I want to see you again in 3-4 months.  Prescriptions: CELEXA 20 MG TABS (CITALOPRAM HYDROBROMIDE) take 1 tablet by mouth daily  #90 x 0   Entered and Authorized by:   Antoine Primas DO   Signed by:   Antoine Primas DO on 05/29/2010   Method used:   Historical   RxID:   [id number]

## 2010-12-10 NOTE — Progress Notes (Signed)
Summary: Rx Req  Phone Note Refill Request Call back at Home Phone [phone] Message from:  Patient  Refills Requested: Medication #1:  LEVOTHROID 112 MCG TABS one by mouth daily for thyroid   Dosage confirmed as above?Dosage Confirmed   Brand Name Necessary? No   Supply Requested: 3 months   Last Refilled: 06/20/2010 NEEDS THIS FAXED TO [facility] THE PHONE NUMBER [phone]    Initial call taken by: Clydell Hakim,  June 20, 2010 8:37 AM Caller: Patient    Prescriptions: LEVOTHROID 112 MCG TABS (LEVOTHYROXINE SODIUM) one by mouth daily for thyroid  #90 Tablet x 1   Entered and Authorized by:   Antoine Primas DO   Signed by:   Antoine Primas DO on 06/20/2010   Method used:   Faxed to ...       [facility] (mail-order)             , [place]         Ph: [phone]       Fax: [phone]   RxID:   [id number]

## 2010-12-10 NOTE — Progress Notes (Signed)
----   Converted from flag ---- ---- 01/31/2010 3:11 PM, Bradly Bienenstock wrote: Ms. Runquist wanted me to let you know that you were right about the Synthroid.  It is .  Olegario Messier H ------------------------------

## 2010-12-10 NOTE — Progress Notes (Signed)
Summary: phn msg  Phone Note Call from Patient Call back at Home Phone [phone]   Caller: Patient Summary of Call: Can Dr. Katrinka Blazing call her about her f/up appt she is to have. Initial call taken by: Clydell Hakim,  August 11, 2010 10:12 AM  Follow-up for Phone Call        called pt no answer will try again later Follow-up by: Antoine Primas DO,  August 12, 2010 10:25 AM  Additional Follow-up for Phone Call Additional follow up Details #1::        attempted to call again lft message  Additional Follow-up by: Antoine Primas DO,  August 12, 2010 4:02 PM

## 2010-12-10 NOTE — Progress Notes (Signed)
Summary: Shingles Ques  Phone Note Call from Patient Call back at Home Phone [phone]   Caller: Patient Summary of Call: Pt says we tell her that she has had the shingle shot, but she is not confident with this is there a blood test that can be done to prove she has had the shingles shot or can she have another one on top of one? Initial call taken by: Clydell Hakim,  February 10, 2010 9:08 AM  Follow-up for Phone Call        will send message to PCP to please advise. It is documented that patient had shingles vaccine 12/07/2008.  patient does not think she had it . can we give her another ? Follow-up by: Theresia Lo RN,  February 10, 2010 12:38 PM  Additional Follow-up for Phone Call Additional follow up Details #1::        spoke with pt. informed her that we have documentation that she received the vaccine and therefore she should feel confident that she received it. It is a one-time vaccine and I'm not certaino of its safety with multiple doses. She is agreeable to foregoing a second dose.  Additional Follow-up by: Myrtie Soman  MD,  February 12, 2010 1:33 PM

## 2010-12-10 NOTE — Progress Notes (Signed)
Summary: Rx Req  Phone Note Refill Request Call back at Home Phone [phone] Message from:  Patient  Refills Requested: Medication #1:  CELEXA 20 MG TABS take 2 tablet by mouth daily   Dosage confirmed as above?Dosage Confirmed   Brand Name Necessary? No   Supply Requested: 3 months   Last Refilled: 08/28/2010 [facility].    Initial call taken by: Clydell Hakim,  August 28, 2010 1:57 PM    Prescriptions: CELEXA 20 MG TABS (CITALOPRAM HYDROBROMIDE) take 2 tablet by mouth daily  #62 x 3   Entered and Authorized by:   Antoine Primas DO   Signed by:   Antoine Primas DO on 08/28/2010   Method used:   Faxed to ...       [facility] (mail-order)             , [place]         Ph:        Fax: [phone]   RxID:   [id number]

## 2010-12-11 NOTE — Letter (Signed)
Summary: Generic Letter  [facility]  [address]   [place], [place] [address]   Phone: [phone]  Fax: [phone]    12/02/2010  [address] [place], [place]  [address]  Dear Ms. Towson,  We are happy to let you know that since you are covered under Medicare you are able to have a FREE visit at the [facility] to discuss your HEALTH. This is a new benefit for Medicare.  There will be no co-payment.  At this visit you will meet with Arlys John an expert in wellness and the health coach at our clinic.  At this visit we will discuss ways to keep you healthy and feeling well.  This visit will not replace your regular doctor visit and we cannot refill medications.     You will need to plan to be here at least one hour to talk about your medical history, your current status, review all of your medications, and discuss your future plans for your health.  This information will be entered into your record for your doctor to have and review.  If you are interested in staying healthy, this type of visit can help.  Please call the office at: [phone], to schedule a "Medicare Wellness Visit".  The day of the visit you should bring in all of your medications, including any vitamins, herbs, over the counter products you take.  Make a list of all the other doctors that you see, so we know who they are. If you have any other health documents please bring them.  We look forward to helping you stay healthy.  Sincerely,   [facility]  iAWV

## 2010-12-11 NOTE — Assessment & Plan Note (Signed)
Summary: f/u  kh   Vital Signs:  Patient profile:   70 year old female Height:      65.75 inches Weight:      176.5 pounds BMI:     28.81 Temp:     98.3 degrees F oral Pulse rate:   97 / minute BP sitting:   142 / 84  (right arm) Cuff size:   regular  Vitals Entered By: Garen Grams LPN (October 20, 2010 10:06 AM) CC: f/u meds Is Patient Diabetic? No Pain Assessment Patient in pain? no        Primary Care Raliegh Scobie:  Antoine Primas DO  CC:  f/u meds.  History of Present Illness: Pt is here for f/u on  1. Depression: Pt states feeling much better overall with effexor now.  Has started going to the gym more frequently whcih has helped.  Denies SI, HI Taking Medications:effexor Side Effects:no  Overall:no improvement Going to therapy: Pt has started going to see Phylliss Blakes and is doing well, has a journeal of her feelings whcih has helped.  Pt states this is not a mood but should be considered "low vitality"    Habits & Providers  Alcohol-Tobacco-Diet     Tobacco Status: never  Current Medications (verified): 1)  Levothroid 112 Mcg Tabs (Levothyroxine Sodium) .... One By Mouth Daily For Thyroid 2)  Zocor 40 Mg Tabs (Simvastatin) .... One By Mouth Daily For Cholesterol 3)  Adult Aspirin Low Strength 81 Mg  Tbdp (Aspirin) 4)  Caltrate 600+d Plus 600-400 Mg-Unit Tabs (Calcium Carbonate-Vit D-Min) .... 2 Tabs By Mouth Daily 5)  Venlafaxine Hcl 37.5 Mg Xr24h-Cap (Venlafaxine Hcl) .... Take 2 Tabs By Mouth Daily For The Next Week Then 4 Tabs Daily Thereafter.  Allergies (verified): No Known Drug Allergies  Past History:  Past medical, surgical, family and social histories (including risk factors) reviewed, and no changes noted (except as noted below).  Past Medical History: Reviewed history from 01/03/2009 and no changes required. Depression dx`ed 04/18/1989; tried Zoloft initially,  Diverticulosis w/ h/o diverticulitis 1997/04/18, H/o cholelithiasis, Hiatal hernia incidentally  found on CT (2/98) Diverticulitis 8/09  colonoscopy 2/10 - one sessile polyp removed - plan repeat scope in 5 years.   had bone density scan per her report with [facility] weight study in April 18, 2006...states this was normal.  Past Surgical History: Reviewed history from 01/06/2007 and no changes required. Appendectomy [birth year] 19, 1951) -, Breast biopsy: no malignancy - 07/10/1993, Tonsillectomy (?1946) -, TSH = 0.472; FBS = 74; Cr = 0.9 - 01/21/2005, Vag hysterectomy for fibroids (1975) -  Family History: Reviewed history from 01/31/2010 and no changes required. Brother--3 years younger; Hep C, smoker, Daughter--hypoglycemia, Father--died at 67; smoker; alcoholic; CVAs, MIs when elderly, Maternal aunt--DM, Mother--died at 29 of CHF; smoker; alcoholic, Son--alcoholic, Uncle--suicide son with congenital hyperbilirubinemia. PGM with ? 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  Social History: Reviewed history from 05/29/2010 and no changes required. 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  Agree with pt decision and will see her again in 3 months at her cpe.   The following medications were removed from the medication list:    Celexa 20 Mg Tabs (Citalopram hydrobromide) .Marland Kitchen... Take 1 tablet daily for the next week then stop. Her updated medication list for this problem includes:    Venlafaxine Hcl 37.5 Mg Xr24h-cap (Venlafaxine hcl) .Marland Kitchen... Take 2 tabs by mouth daily for the next week then 4 tabs daily thereafter.  Orders: [facility]- Est Level  3 ([address])  Complete Medication List: 1)  Levothroid 112 Mcg Tabs (Levothyroxine sodium) .... One by mouth daily for thyroid 2)  Zocor 40 Mg Tabs (Simvastatin) .... One by mouth daily for cholesterol 3)  Adult Aspirin Low Strength 81 Mg Tbdp (Aspirin) 4)  Caltrate 600+d Plus 600-400 Mg-unit Tabs (Calcium carbonate-vit d-min) .... 2 tabs by mouth daily 5)  Venlafaxine Hcl 37.5 Mg Xr24h-cap (Venlafaxine hcl) .... Take 2 tabs by mouth daily for the next week then 4 tabs daily thereafter.   Orders Added: 1)  [facility]- Est Level  3 [60454]

## 2010-12-11 NOTE — Progress Notes (Signed)
Summary: Rx  Phone Note Refill Request Call back at Home Phone [phone]   Refills Requested: Medication #1:  ZOCOR 40 MG TABS one by mouth daily for cholesterol pts mail order pharmacy is changing from [facility] to [facility], pt needs 90 day supply. pt asking for 2, 75 mgs tablets, this will save her money.   Initial call taken by: Knox Royalty,  November 18, 2010 1:38 PM Reason for Call: Talk to Nurse Initial call taken by: Knox Royalty,  November 18, 2010 1:37 PM  Follow-up for Phone Call        called pt and told her that sent in the rx and changed to the 150 pill Follow-up by: Antoine Primas DO,  November 18, 2010 3:58 PM    New/Updated Medications: ZOCOR 40 MG TABS (SIMVASTATIN) one by mouth daily for cholesterol EFFEXOR XR 150 MG XR24H-CAP (VENLAFAXINE HCL) 1 tab daily Prescriptions: ZOCOR 40 MG TABS (SIMVASTATIN) one by mouth daily for cholesterol  #90 x 3   Entered and Authorized by:   Antoine Primas DO   Signed by:   Antoine Primas DO on 11/18/2010   Method used:   Electronically to        [facility]* (mail-order)       [address] [place], [place]  [address]       Ph: [phone]       Fax: [phone]   RxID:   [id number] EFFEXOR XR 150 MG XR24H-CAP (VENLAFAXINE HCL) 1 tab daily  #90 x 3   Entered and Authorized by:   Antoine Primas DO   Signed by:   Antoine Primas DO on 11/18/2010   Method used:   Electronically to        [facility]* (mail-order)       [address] [place], [place]  [address]       Ph: [phone]       Fax: [phone]   RxID:   [id number]

## 2010-12-13 ENCOUNTER — Encounter: Payer: Self-pay | Admitting: *Deleted

## 2011-01-12 ENCOUNTER — Telehealth: Payer: Self-pay | Admitting: Family Medicine

## 2011-01-12 DIAGNOSIS — E039 Hypothyroidism, unspecified: Secondary | ICD-10-CM

## 2011-01-12 MED ORDER — LEVOTHYROXINE SODIUM 112 MCG PO TABS: 112.0000 ug | ORAL_TABLET | Freq: Every day | ORAL | Status: DC

## 2011-01-12 NOTE — Telephone Encounter (Signed)
Have sent in again at this time.

## 2011-01-12 NOTE — Telephone Encounter (Signed)
[facility] states that they have faxed refill request twice.  Told pt that she needed to call to get this refill L thyroxine Fax [phone]

## 2011-02-03 ENCOUNTER — Encounter: Payer: Self-pay | Admitting: Family Medicine

## 2011-02-04 ENCOUNTER — Encounter: Payer: Self-pay | Admitting: Family Medicine

## 2011-02-04 ENCOUNTER — Ambulatory Visit (INDEPENDENT_AMBULATORY_CARE_PROVIDER_SITE_OTHER): Payer: Federal, State, Local not specified - PPO | Admitting: Family Medicine

## 2011-02-04 DIAGNOSIS — E663 Overweight: Secondary | ICD-10-CM

## 2011-02-04 DIAGNOSIS — F329 Major depressive disorder, single episode, unspecified: Secondary | ICD-10-CM

## 2011-02-04 DIAGNOSIS — E039 Hypothyroidism, unspecified: Secondary | ICD-10-CM

## 2011-02-04 NOTE — Assessment & Plan Note (Addendum)
Doing well returning in July and will see how pt does then.  No red flags. Pt declined having TSH checked again and will check in July when pt comes back

## 2011-02-04 NOTE — Assessment & Plan Note (Signed)
Will continue doing same regimen pt seems to be doing much better overall. Will continue current regimen.

## 2011-02-04 NOTE — Assessment & Plan Note (Signed)
Lost 12 # since last visit appears pt is doing well not drinking as much as well which is great.

## 2011-02-04 NOTE — Progress Notes (Signed)
  Subjective:    Patient ID: Amber Allen, female    DOB: 1941/07/07, 70 y.o.   MRN: [id number]  HPI Pt is here for her physical exam has no complaints Pt has been doing very well  Depression-  Pt states she is feeling much better on the Effexor, pt has even been able to decrease her alcohol consumption and started to walk which has allowed her to lose weight. Pt denies  Suicidal and Homicidal ideation   Hypothyroid-  Medication synthroid 112 Taking medication:yes Denies fatigue, weakness, swelling, hair loss or weight gain.  Pt has decreased her drinking as well to 2 glasses of wine a night from 5 and is feeling much better. Pt wants to do her mammogram every 2 years and colonoscopy due in 2017. No pap indicated.  Review of Systems     Objective:   Physical Exam     General Appearance:    Alert, cooperative, no distress, appears stated age  Head:    Normocephalic, without obvious abnormality, atraumatic  Eyes:    PERRL, conjunctiva/corneas clear, EOM's intact,   Nose:   Nares normal, septum midline, mucosa normal, no drainage    or sinus tenderness  Throat:   Lips, mucosa, and tongue normal; teeth and gums normal  Neck:   Supple, symmetrical, trachea midline, no adenopathy;    thyroid:  no enlargement/tenderness/nodules;   Lungs:     Clear to auscultation bilaterally, respirations unlabored  Chest Wall:    No tenderness or deformity   Heart:    Regular rate and rhythm, S1 and S2 normal, no murmur, rub   or gallop  Abdomen:     Soft, non-tender, bowel sounds active all four quadrants,    no masses, no organomegaly  Extremities:   Extremities normal, atraumatic, no cyanosis or edema  Pulses:   2+ and symmetric all extremities  Skin:   Skin color, texture, turgor normal, no rashes or lesions mildly dry skin overall    Assessment & Plan:

## 2011-05-18 ENCOUNTER — Ambulatory Visit (INDEPENDENT_AMBULATORY_CARE_PROVIDER_SITE_OTHER): Payer: Self-pay | Admitting: Family Medicine

## 2011-05-18 ENCOUNTER — Encounter: Payer: Self-pay | Admitting: Family Medicine

## 2011-05-18 DIAGNOSIS — F329 Major depressive disorder, single episode, unspecified: Secondary | ICD-10-CM

## 2011-05-18 DIAGNOSIS — E7800 Pure hypercholesterolemia, unspecified: Secondary | ICD-10-CM

## 2011-05-18 DIAGNOSIS — E663 Overweight: Secondary | ICD-10-CM

## 2011-05-18 DIAGNOSIS — F3289 Other specified depressive episodes: Secondary | ICD-10-CM

## 2011-05-18 DIAGNOSIS — E039 Hypothyroidism, unspecified: Secondary | ICD-10-CM

## 2011-05-18 LAB — LDL CHOLESTEROL, DIRECT: Direct LDL: 114 mg/dL — ABNORMAL HIGH

## 2011-05-18 NOTE — Progress Notes (Signed)
  Subjective:    Patient ID: Amber Allen, female    DOB: 09-Apr-1941, 70 y.o.   MRN: [id number]  HPI  Hypothyroidism-  Pt states she has been feeling a little more fatigue than usual.   Pt states she has blamed it on the heat.  Denies any fever chills, any weight changes, been taking daily. No swelling no hair loss.   Depression-  Pt states she is feeling much better on the Effexor,started to walk which has allowed her to lose weight. Pt denies  Suicidal and Homicidal ideation   .   Pt cholesterol has not been checked for some time.  Pt on Simvistatin 40 tolerating well without side effects. Pt denies leg pain and has been taking medicine religiously.     Pt wants to do her mammogram every 2 years and colonoscopy due in 2017. No pap indicated.  Review of Systems Denies fever, chills, nausea vomiting abdominal pain, dysuria, chest pain, shortness of breath dyspnea on exertion or numbness in extremities Past medical history, social, surgical and family history all reviewed.      Objective:   Physical Exam    vital reviewed.  General Appearance:    Alert, cooperative, no distress, appears stated age  Head:    Normocephalic, without obvious abnormality, atraumatic  Eyes:    PERRL, conjunctiva/corneas clear, EOM's intact,   Nose:   Nares normal, septum midline, mucosa normal, no drainage    or sinus tenderness  Throat:   Lips, mucosa, and tongue normal; teeth and gums normal  Neck:   Supple, symmetrical, trachea midline, no adenopathy;    thyroid:  no enlargement/tenderness/nodules;   Lungs:     Clear to auscultation bilaterally, respirations unlabored  Chest Wall:    No tenderness or deformity   Heart:    Regular rate and rhythm, S1 and S2 normal, no murmur, rub   or gallop  Abdomen:     Soft, non-tender, bowel sounds active all four quadrants,    no masses, no organomegaly  Extremities:   Extremities normal, atraumatic, no cyanosis or edema  Pulses:   2+ and symmetric all  extremities  Skin:   Skin color, texture, turgor normal, no rashes or lesions mildly dry skin overall    Assessment & Plan:

## 2011-05-18 NOTE — Assessment & Plan Note (Signed)
Pt seems to be doing very well no red flags in good spirits very happy with the effexor and will continue current regimen.

## 2011-05-18 NOTE — Assessment & Plan Note (Signed)
Will get LDL today and make sure simvistatin at higher dose is helping, goal of LDL <120 would be continued.

## 2011-05-18 NOTE — Assessment & Plan Note (Signed)
Have not checked for some time will get TSh and make sure pt still on proper dose, pt has complained of some fatigue but still appears to be doing well overall with no red flags.

## 2011-05-18 NOTE — Patient Instructions (Signed)
It is so good to see you and how well you are doing Your weight is great! I will get labs today and will call you with the results Continue all your medicine and you can decide on your aspirin.  I want to see you again in 4-6 months then .

## 2011-05-19 LAB — BASIC METABOLIC PANEL
BUN: 16 mg/dL (ref 6–23)
CO2: 24 mEq/L (ref 19–32)
Calcium: 10.4 mg/dL (ref 8.4–10.5)
Chloride: 105 mEq/L (ref 96–112)
Creat: 0.88 mg/dL (ref 0.50–1.10)
Glucose, Bld: 84 mg/dL (ref 70–99)
Potassium: 4.5 mEq/L (ref 3.5–5.3)
Sodium: 142 mEq/L (ref 135–145)

## 2011-05-19 LAB — TSH: TSH: 0.313 u[IU]/mL — ABNORMAL LOW (ref 0.350–4.500)

## 2011-07-16 ENCOUNTER — Telehealth: Payer: Self-pay | Admitting: Family Medicine

## 2011-07-16 DIAGNOSIS — E039 Hypothyroidism, unspecified: Secondary | ICD-10-CM

## 2011-07-16 MED ORDER — VENLAFAXINE HCL ER 150 MG PO CP24: 150.0000 mg | ORAL_CAPSULE | Freq: Every day | ORAL | Status: DC

## 2011-07-16 MED ORDER — SIMVASTATIN 40 MG PO TABS: 40.0000 mg | ORAL_TABLET | Freq: Every day | ORAL | Status: DC

## 2011-07-16 MED ORDER — LEVOTHYROXINE SODIUM 112 MCG PO TABS: 112.0000 ug | ORAL_TABLET | Freq: Every day | ORAL | Status: DC

## 2011-07-16 NOTE — Telephone Encounter (Signed)
Amber Allen requesting her Levothyroxine,simvastatin, and Venlafaxine sent to [facility].  Fax# [phone]

## 2011-07-16 NOTE — Telephone Encounter (Signed)
done

## 2011-07-17 ENCOUNTER — Telehealth: Payer: Self-pay | Admitting: Family Medicine

## 2011-07-17 NOTE — Telephone Encounter (Addendum)
Spoke with patient and she states the  Exposure was 24 hours ago. She started with the nausea today around 7:00 AM. No probelm with breathing , no wheezing or SOB.  States she was stuffy before exposure. Consulted with Dr. Deirdre Priest and he advises to watch for symptoms and if has any worsening nausea , breathing problem to go to urgent care. Advised clear liquids and bland diet.

## 2011-07-17 NOTE — Telephone Encounter (Signed)
Son in law put a draino type of fluid down the drain yesterday and the fumes were really great.  She was in the next room and didn't smell it at the time.  Today she is very stuffy/nauseated and would like to speak to nurse about what to do.

## 2012-01-13 ENCOUNTER — Ambulatory Visit (INDEPENDENT_AMBULATORY_CARE_PROVIDER_SITE_OTHER): Payer: Medicare Other | Admitting: Family Medicine

## 2012-01-13 ENCOUNTER — Encounter: Payer: Self-pay | Admitting: Family Medicine

## 2012-01-13 DIAGNOSIS — E559 Vitamin D deficiency, unspecified: Secondary | ICD-10-CM

## 2012-01-13 DIAGNOSIS — H539 Unspecified visual disturbance: Secondary | ICD-10-CM | POA: Insufficient documentation

## 2012-01-13 DIAGNOSIS — F329 Major depressive disorder, single episode, unspecified: Secondary | ICD-10-CM

## 2012-01-13 DIAGNOSIS — E7800 Pure hypercholesterolemia, unspecified: Secondary | ICD-10-CM

## 2012-01-13 DIAGNOSIS — E039 Hypothyroidism, unspecified: Secondary | ICD-10-CM

## 2012-01-13 LAB — COMPREHENSIVE METABOLIC PANEL
ALT: 17 U/L (ref 0–35)
AST: 19 U/L (ref 0–37)
Albumin: 4.2 g/dL (ref 3.5–5.2)
Alkaline Phosphatase: 85 U/L (ref 39–117)
BUN: 12 mg/dL (ref 6–23)
CO2: 27 mEq/L (ref 19–32)
Calcium: 9.5 mg/dL (ref 8.4–10.5)
Chloride: 103 mEq/L (ref 96–112)
Creat: 0.85 mg/dL (ref 0.50–1.10)
Glucose, Bld: 71 mg/dL (ref 70–99)
Potassium: 4.4 mEq/L (ref 3.5–5.3)
Sodium: 137 mEq/L (ref 135–145)
Total Bilirubin: 0.5 mg/dL (ref 0.3–1.2)
Total Protein: 6.4 g/dL (ref 6.0–8.3)

## 2012-01-13 LAB — LIPID PANEL
Cholesterol: 228 mg/dL — ABNORMAL HIGH (ref 0–200)
HDL: 62 mg/dL (ref >39)
LDL Cholesterol: 134 mg/dL — ABNORMAL HIGH (ref 0–99)
Total CHOL/HDL Ratio: 3.7 Ratio
Triglycerides: 160 mg/dL — ABNORMAL HIGH (ref <150)
VLDL: 32 mg/dL (ref 0–40)

## 2012-01-13 NOTE — Progress Notes (Signed)
  Subjective:    Patient ID: Amber Allen, female    DOB: 1940-12-10, 71 y.o.   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 Patient states that she has this from time to time but usually only has one or 2 episodes every 3-4 years. Patient though recently has had a frequent amount having for the last 2 weeks. Patient states that she has this see sheet light in her visual field bilaterally but can last minutes. Never had flashing lights never had loss of vision but does have distortion of vision. Patient denies any fevers chills or any headache patient also denies any associated symptoms such as nausea vomiting diarrhea abdominal pain dizziness or lightheadedness. Patient states that she usually can both her weight about 5-10 minutes and then her vision comes back and she is able to: With her daily activity she is just concerned because of the increasing frequency. Patient also denies any numbness in the extremities or any word finding trouble.  Depression-patient has a history of this has been treated with Effexor doing very well having no side effects such as palpitations chest pain or any significant weight loss. Patient is very happy with her regimen would like to continue it.  Hypothyroidism-patient has been on Synthroid for some time had had to increase it over the course of the last year. Patient has not had her TSH checked in quite some time. Lab Results  Component Value Date   TSH 0.313* 05/18/2011   patient denies any hair loss any tremor any significant weight gain or weight loss any insomnia or any lower extremity edema.   Review of Systems As stated above in history of present illness    Objective:   Physical Exam  Constitutional: She is oriented to person, place, and time. She appears well-developed and well-nourished.  HENT:  Head: Normocephalic and atraumatic.  Right Ear: External ear normal.  Left Ear: External ear normal.  Eyes: Conjunctivae  and EOM are normal. Pupils are equal, round, and reactive to light. Right eye exhibits no discharge. Left eye exhibits no discharge.  Fundoscopic exam:      The right eye shows no arteriolar narrowing, no AV nicking, no hemorrhage and no papilledema.  Neck: Normal range of motion. Neck supple. No thyromegaly present.  Cardiovascular: Normal rate, regular rhythm and normal heart sounds.   No murmur heard. Pulmonary/Chest: Effort normal and breath sounds normal. No respiratory distress.  Abdominal: Soft. Bowel sounds are normal. She exhibits no mass. There is no tenderness.  Musculoskeletal: Normal range of motion.  Neurological: She is alert and oriented to person, place, and time. She has normal strength and normal reflexes. No cranial nerve deficit. She displays a negative Romberg sign. Coordination normal.      Assessment & Plan:

## 2012-01-13 NOTE — Assessment & Plan Note (Signed)
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 Unfortunately usually with ocular migraines they are associated with a headache and loss of vision completely is more of a distortion. Was somewhat concerned with potential retinal pathology as well but on funduscopic exam seems to be normal. Discussed with patient that I would consider seeing an ophthalmologist which patient declined. Told her the potential differential diagnosis includes multiple pathology that could be very detrimental to her long-term vision health. Patient stated she has not overly concerned and it does continue she will come back and then we will consider sending her to an ophthalmologist. Patient knows of red flags and when to seek medical attention such as numbness or weakness in the extremities slurred speech or loss of vision in one or both eyes.

## 2012-01-13 NOTE — Patient Instructions (Addendum)
Is very good to see you. For your ocular migraines at this time we will continue to monitor and see if frequency gets any worse. I will check labs because this can be associated with her thyroid. Come back a scheduled appointment for your physical exam and we'll discuss this in more detail. If at any point he was completely patient or start having any numbness in the extremities or trouble with word finding please seek medical attention immediately. Have a safe trip and I will see soon!

## 2012-01-13 NOTE — Assessment & Plan Note (Signed)
Patient seems to be doing well we will check a TSH adjust accordingly. This could be concerned for her ocular migraines.

## 2012-01-13 NOTE — Assessment & Plan Note (Signed)
Continue current amount of Effexor we'll make no changes patient seems to be doing very well.

## 2012-01-14 LAB — T4, FREE: Free T4: 1.49 ng/dL (ref 0.80–1.80)

## 2012-01-14 LAB — TSH: TSH: 1.207 u[IU]/mL (ref 0.350–4.500)

## 2012-01-14 LAB — VITAMIN D 25 HYDROXY (VIT D DEFICIENCY, FRACTURES): Vit D, 25-Hydroxy: 38 ng/mL (ref 30–89)

## 2012-02-15 ENCOUNTER — Encounter: Payer: Self-pay | Admitting: Family Medicine

## 2012-02-15 ENCOUNTER — Ambulatory Visit (INDEPENDENT_AMBULATORY_CARE_PROVIDER_SITE_OTHER): Payer: Medicare Other | Admitting: Family Medicine

## 2012-02-15 VITALS — BP 136/79 | HR 118 | Temp 99.1°F | Ht 65.7 in | Wt 179.0 lb

## 2012-02-15 DIAGNOSIS — E039 Hypothyroidism, unspecified: Secondary | ICD-10-CM

## 2012-02-15 DIAGNOSIS — F329 Major depressive disorder, single episode, unspecified: Secondary | ICD-10-CM

## 2012-02-15 DIAGNOSIS — E7800 Pure hypercholesterolemia, unspecified: Secondary | ICD-10-CM

## 2012-02-15 DIAGNOSIS — Z0000 Encounter for general adult medical examination without abnormal findings: Secondary | ICD-10-CM

## 2012-02-15 MED ORDER — FLUTICASONE PROPIONATE 50 MCG/ACT NA SUSP: 2.0000 | Freq: Every day | NASAL | Status: DC

## 2012-02-15 MED ORDER — VENLAFAXINE HCL 100 MG PO TABS: 100.0000 mg | ORAL_TABLET | Freq: Two times a day (BID) | ORAL | Status: DC

## 2012-02-15 NOTE — Assessment & Plan Note (Signed)
Patient seems to be doing very well overall, we will actually changed pads per 200 mg twice a day. Patient given potential side effects but if this helps patient's financial restraints hopefully that will increase compliance.

## 2012-02-15 NOTE — Assessment & Plan Note (Signed)
At goal no changes necessary.  

## 2012-02-15 NOTE — Assessment & Plan Note (Signed)
Continue current therapy. Did increase simvastatin. Patient is not having any side effects. Lab Results  Component Value Date   CHOL 228* 01/13/2012   HDL 62 01/13/2012   LDLCALC 161* 01/13/2012   LDLDIRECT 114* 05/18/2011   TRIG 160* 01/13/2012   CHOLHDL 3.7 01/13/2012

## 2012-02-15 NOTE — Patient Instructions (Signed)
Is good to see you and enjoyed a new stove. I have change her Effexor to tabs. I have made sure you're levothyroxine is generic. I'm giving you a nose spray to try for your allergies. Use it one spray in each nostril daily for at least the first month. You know where I will be if you need me.

## 2012-02-15 NOTE — Progress Notes (Signed)
  Subjective:    Patient ID: Amber Allen, female    DOB: 08-12-1941, 71 y.o.   MRN: [id number]  Shoulder Pain    patient is here for followup as well as complete physical exam  Ocular migraines- patient at last visit complained of having increasing frequency of ocular migraines. Since that visit she has had none in does state she did have increased stress at that time she forgot to tell me about.  Depression-patient has a history of this has been treated with Effexor doing very well having no side effects such as palpitations chest pain or any significant weight loss. Patient though has switched to Medicare and is now having to pay increasing amounts for the extended release Effexor. Patient would like to change to the tabs if appropriate.  Hypothyroidism-patient has been on Synthroid for some time had had to increase it over the course of the last year. Patient has not had her TSH checked in quite some time. Lab Results  Component Value Date   TSH 1.207 01/13/2012   patient denies any hair loss any tremor any significant weight gain or weight loss any insomnia or any lower extremity edema.  Preventative medicine-patient has had colonoscopy as well as mammogram. In addition to this patient screening for fall risk is normal and pHQ 9 is 4. Review of Systems  As stated above in history of present illness    Objective:   Physical Exam  Constitutional: She is oriented to person, place, and time. She appears well-developed and well-nourished.  HENT:  Head: Normocephalic and atraumatic.  Right Ear: External ear normal.  Left Ear: External ear normal.  Eyes: Conjunctivae and EOM are normal. Pupils are equal, round, and reactive to light. Right eye exhibits no discharge. Left eye exhibits no discharge.  Fundoscopic exam:      The right eye shows no arteriolar narrowing, no AV nicking, no hemorrhage and no papilledema.  Neck: Normal range of motion. Neck supple. No thyromegaly present.    Cardiovascular: Normal rate, regular rhythm and normal heart sounds.   No murmur heard. Pulmonary/Chest: Effort normal and breath sounds normal. No respiratory distress.  Abdominal: Soft. Bowel sounds are normal. She exhibits no mass. There is no tenderness.  Musculoskeletal: Normal range of motion.  Neurological: She is alert and oriented to person, place, and time. She has normal strength and normal reflexes. No cranial nerve deficit. She displays a negative Romberg sign. Coordination normal.      Assessment & Plan:

## 2012-02-16 ENCOUNTER — Telehealth: Payer: Self-pay | Admitting: Family Medicine

## 2012-02-16 NOTE — Telephone Encounter (Signed)
States that Dr Katrinka Blazing had sent script for Flonase to pharm and they say they don't have it yet.  [facility] [place] Looks like it was sent to wrong pharm.

## 2012-02-16 NOTE — Telephone Encounter (Signed)
Flonase and effexor was sent to mail order pharmacy.  Patient states this is OK she will get from them.

## 2012-09-09 ENCOUNTER — Ambulatory Visit (INDEPENDENT_AMBULATORY_CARE_PROVIDER_SITE_OTHER): Payer: Medicare Other | Admitting: Family Medicine

## 2012-09-09 ENCOUNTER — Encounter: Payer: Self-pay | Admitting: Family Medicine

## 2012-09-09 VITALS — BP 130/88 | HR 60 | Temp 98.6°F | Ht 65.7 in | Wt 178.0 lb

## 2012-09-09 DIAGNOSIS — Z23 Encounter for immunization: Secondary | ICD-10-CM

## 2012-09-09 DIAGNOSIS — M654 Radial styloid tenosynovitis [de Quervain]: Secondary | ICD-10-CM

## 2012-09-09 HISTORY — DX: Radial styloid tenosynovitis (de quervain): M65.4

## 2012-09-09 MED ORDER — MELOXICAM 7.5 MG PO TABS: 7.5000 mg | ORAL_TABLET | Freq: Every day | ORAL | Status: DC

## 2012-09-09 NOTE — Progress Notes (Signed)
Patient ID: Amber Allen, female   DOB: 1941/05/02, 71 y.o.   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 Pain is located in the wrist just proximal to the thumb. Worse w/ certain movements causing severe sharp pain. Pt notes increased use of L hand over last 4 months as she now has internet access at her house and uses a laptop. Pt is right handed. Denis any loss of sensation or strength in her hand. No joint effusions, or skin rashes, or fevers.    Patient is a nonsmoker.  Past Medical History  Diagnosis Date  . Depression   . Diverticulosis   . Cholecystolithiasis     ROS as above otherwise neg. No Chest pain, palpitations, SOB,   Medications reviewed. Current Outpatient Prescriptions  Medication Sig Dispense Refill  . aspirin 81 MG tablet        . Calcium Carbonate-Vitamin D (CALTRATE 600+D) 600-400 MG-UNIT per tablet Take 2 tablets by mouth daily.        . fluticasone (FLONASE) 50 MCG/ACT nasal spray Place 2 sprays into the nose daily.  16 g  6  . levothyroxine (SYNTHROID, LEVOTHROID) 112 MCG tablet Take 1 tablet (112 mcg total) by mouth daily.  90 tablet  1  . meloxicam (MOBIC) 7.5 MG tablet Take 1-2 tablets (7.5-15 mg total) by mouth daily.  30 tablet  0  . simvastatin (ZOCOR) 40 MG tablet Take 1 tablet (40 mg total) by mouth daily.  90 tablet  1    Exam:  BP 130/88  Pulse 60  Temp 98.6 F (37 C) (Oral)  Ht 5' 5.7" (1.669 m)  Wt 178 lb (80.74 kg)  BMI 28.99 kg/m2 Gen: Well NAD HEENT: EOMI,  MMM REs: normal effort CV: RRR Abd: ND Musc: hand grip strength 3+ bilaterally, Pain on FInkelstein of L wrist, no pain on R.  Exts: Non edematous BL  LE, warm and well perfused.   No results found for this or any previous visit (from the past 72 hour(s)).

## 2012-09-09 NOTE — Assessment & Plan Note (Signed)
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  Thumb Spica splint Meloxicam for 2 wks

## 2012-09-09 NOTE — Patient Instructions (Addendum)
You have De Quervain's tenosynovitis THis should resolve within 6 weeks Please start wearing the splint all day and night.  Take your hand out to stretch it at least 3 times daily Take the Meloxicam every day for the next 14 days then as needed Please increase your daily regimen after 2 weeks as tolerated   De Quervain's Tenosynovitis De Quervain's tenosynovitis involves inflammation of one or two tendon linings (sheaths) or strain of one or two tendons to the thumb: extensor pollicis brevis (EPB), or abductor pollicis longus (APL). This causes pain on the side of the wrist and base of the thumb. Tendon sheaths secrete a fluid that lubricates the tendon, allowing the tendon to move smoothly. When the sheath becomes inflamed, the tendon cannot move freely in the sheath. Both the EPB and APL tendons are important for proper use of the hand. The EPB tendon is important for straightening the thumb. The APL tendon is important for moving the thumb away from the index finger (abducting). The two tendons pass through a small tube (canal) in the wrist, near the base of the thumb. When the tendons become inflamed, pain is usually felt in this area. SYMPTOMS   Pain, tenderness, swelling, warmth, or redness over the base of the thumb and thumb side of the wrist.  Pain that gets worse when straightening the thumb.  Pain that gets worse when moving the thumb away from the index finger, against resistance.  Pain with pinching or gripping.  Locking or catching of the thumb.  Limited motion of the thumb.  Crackling sound (crepitation) when the tendon or thumb is moved or touched.  Fluid-filled cyst in the area of the base of the thumb. CAUSES   Tenosynovitis is often linked with overuse of the wrist.  Tenosynovitis may be caused by repeated injury to the thumb muscle and tendon units, and with repeated motions of the hand and wrist, due to friction of the tendon within the lining  (sheath).  Tenosynovitis may also be due to a sudden increase in activity or change in activity. RISK INCREASES WITH:  Sports that involve repeated hand and wrist motions (golf, bowling, tennis, squash, racquetball).  Heavy labor.  Poor physical wrist strength and flexibility.  Failure to warm up properly before practice or play.  Female gender.  New mothers who hold their baby's head for long periods or lift infants with thumbs in the infant's armpit (axilla). PREVENTION  Warm up and stretch properly before practice or competition.  Allow enough time for rest and recovery between practices and competition.  Maintain appropriate conditioning:  Cardiovascular fitness.  Forearm, wrist, and hand flexibility.  Muscle strength and endurance.  Use proper exercise technique. PROGNOSIS  This condition is usually curable within 6 weeks, if treated properly with non-surgical treatment and resting of the affected area.  RELATED COMPLICATIONS   Longer healing time if not properly treated or if not given enough time to heal.  Chronic inflammation, causing recurring symptoms of tenosynovitis. Permanent pain or restriction of movement.  Risks of surgery: infection, bleeding, injury to nerves (numbness of the thumb), continued pain, incomplete release of the tendon sheath, recurring symptoms, cutting of the tendons, tendons sliding out of position, weakness of the thumb, thumb stiffness. TREATMENT  First, treatment involves the use of medicine and ice, to reduce pain and inflammation. Patients are encouraged to stop or modify activities that aggravate the injury. Stretching and strengthening exercises may be advised. Exercises may be completed at home or with a  therapist. Bonita Quin may be fitted with a brace or splint, to limit motion and allow the injury to heal. Your caregiver may also choose to give you a corticosteroid injection, to reduce the pain and inflammation. If non-surgical treatment  is not successful, surgery may be needed. Most tenosynovitis surgeries are done as outpatient procedures (you go home the same day). Surgery may involve local, regional (whole arm), or general anesthesia.  MEDICATION   If pain medicine is needed, nonsteroidal anti-inflammatory medicines (aspirin and ibuprofen), or other minor pain relievers (acetaminophen), are often advised.  Do not take pain medicine for 7 days before surgery.  Prescription pain relievers are often prescribed only after surgery. Use only as directed and only as much as you need.  Corticosteroid injections may be given if your caregiver thinks they are needed. There is a limited number of times these injections may be given. COLD THERAPY   Cold treatment (icing) should be applied for 10 to 15 minutes every 2 to 3 hours for inflammation and pain, and immediately after activity that aggravates your symptoms. Use ice packs or an ice massage. SEEK MEDICAL CARE IF:   Symptoms get worse or do not improve in 2 to 4 weeks, despite treatment.  You experience pain, numbness, or coldness in the hand.  Blue, gray, or dark color appears in the fingernails.  Any of the following occur after surgery: increased pain, swelling, redness, drainage of fluids, bleeding in the affected area, or signs of infection.  New, unexplained symptoms develop. (Drugs used in treatment may produce side effects.) Document Released: 10/26/2005 Document Revised: 01/18/2012 Document Reviewed: 02/07/2009 [facility] Patient Information 2013 [place], [place].

## 2012-11-25 ENCOUNTER — Ambulatory Visit (INDEPENDENT_AMBULATORY_CARE_PROVIDER_SITE_OTHER): Payer: Medicare Other | Admitting: Family Medicine

## 2012-11-25 ENCOUNTER — Encounter: Payer: Self-pay | Admitting: Family Medicine

## 2012-11-25 VITALS — BP 164/85 | HR 86 | Temp 97.5°F | Ht 65.7 in | Wt 178.0 lb

## 2012-11-25 DIAGNOSIS — IMO0002 Reserved for concepts with insufficient information to code with codable children: Secondary | ICD-10-CM

## 2012-11-25 DIAGNOSIS — L089 Local infection of the skin and subcutaneous tissue, unspecified: Secondary | ICD-10-CM

## 2012-11-25 DIAGNOSIS — M654 Radial styloid tenosynovitis [de Quervain]: Secondary | ICD-10-CM

## 2012-11-25 DIAGNOSIS — C44621 Squamous cell carcinoma of skin of unspecified upper limb, including shoulder: Secondary | ICD-10-CM | POA: Insufficient documentation

## 2012-11-25 DIAGNOSIS — J310 Chronic rhinitis: Secondary | ICD-10-CM

## 2012-11-25 MED ORDER — CEPHALEXIN 500 MG PO CAPS: 500.0000 mg | ORAL_CAPSULE | Freq: Four times a day (QID) | ORAL | Status: DC

## 2012-11-25 NOTE — Progress Notes (Signed)
Amber Allen is a 72 y.o. female who presents to [facility] today for R hand infection  R hand infection: skin abraison just after Christmas on R hand where her wrist brace sat. Progressively became larger adn more painful. Tried lancing w/ needle several times w/o relief. No Rx tried. Denies any fever, rash. Pain is worse when bumps into things.   DeQuarvians: improved significantly w/ thumb spica splint.   Allergic Rhinitis: ONly using flonase PRN. Doing well. Worse time is the spring and summers  The following portions of the patient's history were reviewed and updated as appropriate: allergies, current medications, past medical history, family and social history, and problem list.  Patient is a nonsmoker   Past Medical History  Diagnosis Date  . Depression   . Diverticulosis   . Cholecystolithiasis     ROS as above otherwise neg.    Medications reviewed. Current Outpatient Prescriptions  Medication Sig Dispense Refill  . aspirin 81 MG tablet        . Calcium Carbonate-Vitamin D (CALTRATE 600+D) 600-400 MG-UNIT per tablet Take 2 tablets by mouth daily.        . fluticasone (FLONASE) 50 MCG/ACT nasal spray Place 2 sprays into the nose daily.  16 g  6  . levothyroxine (SYNTHROID, LEVOTHROID) 112 MCG tablet Take 1 tablet (112 mcg total) by mouth daily.  90 tablet  1  . meloxicam (MOBIC) 7.5 MG tablet Take 1-2 tablets (7.5-15 mg total) by mouth daily.  30 tablet  0  . simvastatin (ZOCOR) 40 MG tablet Take 1 tablet (40 mg total) by mouth daily.  90 tablet  1    Exam:  BP 164/85  Pulse 86  Temp 97.5 F (36.4 C)  Wt 178 lb (80.74 kg) Gen: Well NAD HEENT: EOMI,  MMM Lungs: CTABL Nl WOB Heart: RRR no MRG Skin: 1x1.5cm raised erythematous lesion of the dorsum of the L hand w/ central escar. No purulent drainage expressed adn w/o evidence of underlying pus pocket.  Exts: Non edematous BL  LE, warm and well perfused.   No results found for this or any previous visit (from the past 72  hour(s)).

## 2012-11-25 NOTE — Assessment & Plan Note (Addendum)
Likely infected from skin abraision but concern for malignancy Keflex, warm compresses, NSAIDs Bx or excision in future if not improving

## 2012-11-25 NOTE — Patient Instructions (Addendum)
Thank you for coming in today. I think you have a superficial infection of your hand This may be an early cancer If your wound does not heal after being treated with antibiotics please come back for possible removal of the wound Remember to add yogurt, cheeses, or milk to your diet to help prevent diarrhea

## 2012-11-28 ENCOUNTER — Encounter: Payer: Self-pay | Admitting: Family Medicine

## 2012-11-28 NOTE — Assessment & Plan Note (Signed)
Well controlled.  Continue Flonase PRN

## 2012-11-28 NOTE — Assessment & Plan Note (Signed)
Improving significantly w/ brace and avoidance of certain exacerbating activitieis

## 2012-12-01 ENCOUNTER — Encounter: Payer: Self-pay | Admitting: Family Medicine

## 2012-12-01 ENCOUNTER — Ambulatory Visit (INDEPENDENT_AMBULATORY_CARE_PROVIDER_SITE_OTHER): Payer: Medicare Other | Admitting: Family Medicine

## 2012-12-01 ENCOUNTER — Other Ambulatory Visit: Payer: Self-pay | Admitting: Family Medicine

## 2012-12-01 VITALS — BP 165/83 | HR 97 | Temp 97.7°F | Ht 65.7 in | Wt 175.0 lb

## 2012-12-01 DIAGNOSIS — L989 Disorder of the skin and subcutaneous tissue, unspecified: Secondary | ICD-10-CM

## 2012-12-01 NOTE — Progress Notes (Signed)
Subjective:   1. Left hand lesion-Noted sometime within last 3 months but not sure origin as was wearing a wrist brace. Evaluated 1/17 and given Keflex for concern of abrasion/infection and told to return for evaluation if not improved. No improvement today and patient feels like growing slightly. Denies nausea/vomiting/fever/chills/fatigue/unintentional weight loss  ROS--See HPI  Past Medical History-no history of skin cancer, hypothyroidism, depression (on meds but cannot remember name).  Reviewed problem list.  Medications- reviewed and updated Chief complaint-noted  Objective: BP 165/83  Pulse 97  Temp 97.7 F (36.5 C) (Oral)  Ht 5' 5.7" (1.669 m)  Wt 175 lb (79.379 kg)  BMI 28.50 kg/m2 Gen: NAD, resting comfortably CV: RRR no murmurs rubs or gallops Lungs: CTAB no crackles, wheeze, rhonchi Skin: 1x1.5cm raised circular erythematous lesion of the dorsum of the L hand with scaling noted. No drainage noted.  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   The area surrounding the skin lesion was prepared and draped in the usual sterile manner. Local anesthetic 2ith 3 ccm of 1% xylocaine with epinephrine was used. The lesion was removed in the usual manner by the biopsy method noted above. Electrocautery was used and the base was destructed. Hemostasis was assured. The patient tolerated the procedure well.  Closure:  None  Followup: The patient tolerated the procedure well without complications.  Standard post-procedure care is explained and return precautions are given.  Assessment/Plan:

## 2012-12-01 NOTE — Patient Instructions (Addendum)
Keep area covered and moist for at least one week with either vaseline or antibiotic ointment. I am concerned this is a form of skin cancer (either basal or squamous but likely squamous). You should follow up with Dr. Konrad Dolores to consider a full skin exam and also to discuss your blood pressure which has been elevated at the last 2 visits. Over the counter pain medicines can be used such as tylenol or aleve.   Squamous Cell Carcinoma  Squamous cell carcinoma is the second most common form of skin cancer. It begins in the squamous cells in the outer layer of the skin (epidermis).  CAUSES  Ultraviolet light exposure is the most common cause of squamous cell carcinoma. This may come from sunlight or tanning beds. Squamous cell carcinoma is most common in sun-exposed areas like the face, neck, arms, and hands. However, squamous cell carcinoma can occur anywhere on the body, including the lips, inside the mouth, the legs, sites of long-term (chronic) scarring, and the anus.  Other causes of squamous cell carcinoma can include:  Exposure to arsenic.  Exposure to radiation.  Exposure to toxic tars and oils. RISK FACTORS Factors that increase your risk for squamous cell carcinoma include:  Having fair skin.  Being middle-aged or elderly.  Heavy sun exposure, especially during childhood.  Repeated sunburns.  Use of tanning beds.  A weakened immune system. This includes patients who have received a transplant and patients with human immunodeficiency virus (HIV) or acquired immunodeficency syndrome (AIDS).  Human papillomavirus infection.  Conditions that cause chronic scarring. This can include burn scars, chronic ulcers, heat (thermal) injuries, and radiation.  Exposure to psoralen plus ultraviolet A light therapy.  Exposure to chemical carcinogens, such as tar, soot, and arsenic.  Chronic, inflammatory conditions such as lupus, lichen planus, or lichen sclerosus.  Chronic infections,  such as infections of the bone (osteomyelitis).  Smoking. SYMPTOMS  Squamous cell carcinoma often starts as small, skin-colored (pink or brown) sandpaper-like growths. These growths are called solar keratoses or actinic keratoses. These growths are often more easily felt than seen.  DIAGNOSIS  Your caregiver may be able to tell what is wrong by doing a physical exam. Often, a tissue sample is also taken. The tissue sample is examined under a microscope.  TREATMENT  The treatment for squamous cell carcinoma depends on the size and location of the tumors, as well as your overall health. Possible treatments include:   Mohs surgery. This is a procedure done by a skin doctor (dermatologist or Mohs surgeon) in his or her office. The cancerous cells are removed layer by layer.  Laser surgery to remove the tumor.  Freezing the tumor with liquid nitrogen (cryosurgery).  Radiation. This may be used for tumors on the face.  Electrodesiccation and curettage. This involves alternately scraping and burning the tumor, using an electric current to control bleeding. If treated soon enough, squamous cell carcinoma rarely spreads to other areas of the body (metastasizes). If left untreated, however, squamous cell carcinoma will destroy the nearby tissues. This can result in the loss of a nose or ear. PREVENTION  Avoid the sun between 10:00 am and 4:00 pm when it is the strongest.  Use a sunscreen or sunblock with sun protection factor 30 or greater.  Apply sunscreen at least 30 minutes before exposure to the sun.  Reapply sunscreen every 2 to 4 hours while you are outside, after swimming, and after excessive sweating.  Always wear protective hats, clothing, and sunglasses with ultraviolet  protection.  Avoid tanning beds. HOME CARE INSTRUCTIONS   Avoid unprotected sun exposure.  Do not smoke.  Follow your caregiver's instructions for self-exams. Look for new growths or changes in your  skin.  Keep all follow-up appointments as directed by your caregiver. SEEK MEDICAL CARE IF:   You notice any new growths or changes in your skin.  You have had a squamous cell carcinoma tumor removed and you notice a new growth in the same location. Document Released: 05/02/2003 Document Revised: 01/18/2012 Document Reviewed: 07/20/2011 [facility] Patient Information 2013 [place], [place].

## 2012-12-01 NOTE — Addendum Note (Signed)
Addended by: Shelva Majestic on: 12/01/2012 04:07 PM   Modules accepted: Orders

## 2012-12-01 NOTE — Assessment & Plan Note (Signed)
Lesion not improved with Keflex. Likely malignant and favor squamous cell. Biopsy of lesion performed and sent for biopsy.

## 2012-12-06 ENCOUNTER — Telehealth: Payer: Self-pay | Admitting: Family Medicine

## 2012-12-06 ENCOUNTER — Encounter: Payer: Self-pay | Admitting: Family Medicine

## 2012-12-06 DIAGNOSIS — C44621 Squamous cell carcinoma of skin of unspecified upper limb, including shoulder: Secondary | ICD-10-CM

## 2012-12-06 NOTE — Assessment & Plan Note (Signed)
Well differentiated but margins involved (lateral and deep). Although I used hyfrecator on these areas will refer to dermatology for further excision instead of planning for regular monitoring. Precepted with Dr. Earnest Bailey

## 2012-12-06 NOTE — Telephone Encounter (Signed)
LVM for patient to return call.   I would like to speak to patient when she is back in town. I am going to go ahead and place a referral to dermatology (precepted with Dr. Earnest Bailey).

## 2012-12-07 NOTE — Telephone Encounter (Signed)
Pt returned call - will be in flight today but will call tomorrow (Thurs)

## 2012-12-08 NOTE — Telephone Encounter (Signed)
Informed patient of squamous cell carcinoma and plans for dermatology referral for further management. Patient agreeable. Will forward to PCP and nursing staff.

## 2012-12-08 NOTE — Telephone Encounter (Signed)
Pt will be landing about 11am today - would like for Hunter to call her after that.

## 2012-12-14 ENCOUNTER — Other Ambulatory Visit: Payer: Self-pay | Admitting: Family Medicine

## 2012-12-14 DIAGNOSIS — F329 Major depressive disorder, single episode, unspecified: Secondary | ICD-10-CM

## 2012-12-14 MED ORDER — VENLAFAXINE HCL 100 MG PO TABS: 100.0000 mg | ORAL_TABLET | Freq: Two times a day (BID) | ORAL | Status: DC

## 2012-12-14 NOTE — Telephone Encounter (Signed)
Called and spoke to pt Currently taking Venlafaxine, no interuption in therapy Going to Derm on Monday Refilled Venlafaxine  Shelly Flatten, MD Family Medicine PGY-2 12/14/2012, 1:39 PM

## 2012-12-19 ENCOUNTER — Other Ambulatory Visit: Payer: Self-pay | Admitting: Dermatology

## 2013-02-17 ENCOUNTER — Ambulatory Visit (INDEPENDENT_AMBULATORY_CARE_PROVIDER_SITE_OTHER): Payer: Medicare Other | Admitting: Family Medicine

## 2013-02-17 ENCOUNTER — Encounter: Payer: Self-pay | Admitting: Family Medicine

## 2013-02-17 VITALS — BP 146/86 | HR 101 | Temp 98.1°F | Ht 65.7 in | Wt 176.0 lb

## 2013-02-17 DIAGNOSIS — E559 Vitamin D deficiency, unspecified: Secondary | ICD-10-CM

## 2013-02-17 DIAGNOSIS — Z8719 Personal history of other diseases of the digestive system: Secondary | ICD-10-CM

## 2013-02-17 DIAGNOSIS — R5381 Other malaise: Secondary | ICD-10-CM

## 2013-02-17 DIAGNOSIS — F329 Major depressive disorder, single episode, unspecified: Secondary | ICD-10-CM

## 2013-02-17 DIAGNOSIS — F1020 Alcohol dependence, uncomplicated: Secondary | ICD-10-CM

## 2013-02-17 DIAGNOSIS — R5383 Other fatigue: Secondary | ICD-10-CM

## 2013-02-17 DIAGNOSIS — J310 Chronic rhinitis: Secondary | ICD-10-CM

## 2013-02-17 DIAGNOSIS — C44622 Squamous cell carcinoma of skin of right upper limb, including shoulder: Secondary | ICD-10-CM

## 2013-02-17 DIAGNOSIS — E7800 Pure hypercholesterolemia, unspecified: Secondary | ICD-10-CM

## 2013-02-17 DIAGNOSIS — I10 Essential (primary) hypertension: Secondary | ICD-10-CM

## 2013-02-17 DIAGNOSIS — R69 Illness, unspecified: Secondary | ICD-10-CM

## 2013-02-17 DIAGNOSIS — E663 Overweight: Secondary | ICD-10-CM

## 2013-02-17 DIAGNOSIS — E039 Hypothyroidism, unspecified: Secondary | ICD-10-CM

## 2013-02-17 DIAGNOSIS — C44621 Squamous cell carcinoma of skin of unspecified upper limb, including shoulder: Secondary | ICD-10-CM

## 2013-02-17 LAB — COMPREHENSIVE METABOLIC PANEL
ALT: 19 U/L (ref 0–35)
AST: 16 U/L (ref 0–37)
Albumin: 4.2 g/dL (ref 3.5–5.2)
Alkaline Phosphatase: 92 U/L (ref 39–117)
BUN: 17 mg/dL (ref 6–23)
Calcium: 9.6 mg/dL (ref 8.4–10.5)
Chloride: 107 mEq/L (ref 96–112)
Potassium: 4.7 mEq/L (ref 3.5–5.3)
Sodium: 142 mEq/L (ref 135–145)
Total Protein: 6.6 g/dL (ref 6.0–8.3)

## 2013-02-17 LAB — TSH: TSH: 0.112 u[IU]/mL — ABNORMAL LOW (ref 0.350–4.500)

## 2013-02-17 LAB — LIPID PANEL
HDL: 65 mg/dL (ref >39)
LDL Cholesterol: 108 mg/dL — ABNORMAL HIGH (ref 0–99)

## 2013-02-17 LAB — CBC
HCT: 42.8 % (ref 36.0–46.0)
RDW: 13 % (ref 11.5–15.5)
WBC: 5.1 10*3/uL (ref 4.0–10.5)

## 2013-02-17 NOTE — Patient Instructions (Addendum)
Dear Amber Allen,   It was great to see you again today. Please read below regarding the issues that we discussed.   1. Your blood pressure is at a good level. No medications required at this time.  2. I think an aspirin a day would be a good idea to help prevent stroke.  3. For your depression, continue current medications. It appears well controlled.  4. The most important thing from today is your feelings that you may be drinking too much. I would strongly advise you to start going to an alcoholics anonymous group due to your current use and family history. 5. See healthy tips below. 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 I am sure people will appreciate it.   Please follow up in clinic in 4 weeks then annually. Please call earlier if you have any questions or concerns.   Sincerely,  Dr. Tana Conch   My 5 to Fitness! These are tips I give to every patient that  are important for living a healthy life!   5: fruits and vegetables per day (work on 9 per day if you are at 5) 4: exercise 4-5 times per week for at least 30 minutes (walking counts!) 3: meals per day (don't skip breakfast!) 2: habits to quit -smoking -excess alcohol use (men >2 beer/day; women >1beer/day) 1: sweet per day (2 cookies, 1 small cup of ice cream, 12 oz soda)  These are general tips for healthy living. Try to start with 1 or 2 habit TODAY and make it a part of your life for several months. Once you have 1 or 2 habits down for several months, try to begin working on your next healthy habit. With every single step you take, you will be leading a healthier lifestyle!   Health Maintenance Due-NONE!  Topic Date Due  . Influenza Vaccine  07/10/2013  . Tetanus/tdap  01/08/2015

## 2013-02-17 NOTE — Progress Notes (Signed)
Subjective:  Patient presents for annual physical. SHe states she is currently not doing well with healthy lifestyle choices such as regular exercise or alcohol intake. 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 She is pleased with the cosmetic results.   1. Depression-compliant with effexor. Patient without feelings of depression/hoplessness/denies sleep concerns. PHq9 scoring of a 1 today. No SI/HI.   2. Hypertension- BP Readings from Last 3 Encounters:  02/17/13 146/86  12/01/12 165/83  11/25/12 164/85  Home BP monitoring-no Compliant with medications-not on any  Patient previously with SBP >160 on 2 occasions. Today, blood pressure has improved but SBP still elevated but at goal for age.  Denies any CP, HA, SOB, blurry vision, LE edema, transient weakness, orthopnea, PND.   3. ALcohol abuse/alcholism-patient drinks 2-3 alcoholic beverages (glass of wine) each evening. She feels that she needs to cut back. Mother and father were alcoholics. She has not considered going to AA previously. Denies RUQ pain, driving while impaired, other drug use.  CAGE testing positive on C and G.   ROS--See HPI  Past Medical History Patient Active Problem List  Diagnosis  . HYPOTHYROIDISM, UNSPECIFIED  . UNSPECIFIED VITAMIN D DEFICIENCY  . HYPERCHOLESTEROLEMIA  . OVERWEIGHT  . DEPRESSIVE DISORDER, NOS  . RHINITIS, CHRONIC  . ROTATOR CUFF INJURY, RIGHT SHOULDER  . SLEEPINESS  . FATIGUE  . DIVERTICULITIS, HX OF  . De Quervain's tenosynovitis, left  . Squamous cell carcinoma of hand   Reviewed problem list.  Medications- reviewed and updated Chief complaint-noted  Objective: BP 146/86  Pulse 101  Temp(Src)  98.1 F (36.7 C) (Oral)  Wt 176 lb (79.833 kg)  BMI 28.66 kg/m2 Gen: NAD, resting comfortably CV: RRR no murmurs rubs or gallops Lungs: CTAB no crackles, wheeze, rhonchi Skin: warm, dry. Right hand healing well from recent surgery.  Neuro: grossly normal, moves all extremities  Assessment/Plan:  Discussed starting ASA 81 mg for primary prevention of CVA.

## 2013-02-18 ENCOUNTER — Encounter: Payer: Self-pay | Admitting: Family Medicine

## 2013-02-18 DIAGNOSIS — F1020 Alcohol dependence, uncomplicated: Secondary | ICD-10-CM | POA: Insufficient documentation

## 2013-02-18 DIAGNOSIS — Z87898 Personal history of other specified conditions: Secondary | ICD-10-CM | POA: Insufficient documentation

## 2013-02-18 DIAGNOSIS — F1010 Alcohol abuse, uncomplicated: Secondary | ICD-10-CM | POA: Insufficient documentation

## 2013-02-18 DIAGNOSIS — I10 Essential (primary) hypertension: Secondary | ICD-10-CM | POA: Insufficient documentation

## 2013-02-18 LAB — VITAMIN D 25 HYDROXY (VIT D DEFICIENCY, FRACTURES): Vit D, 25-Hydroxy: 35 ng/mL (ref 30–89)

## 2013-02-18 MED ORDER — LEVOTHYROXINE SODIUM 100 MCG PO TABS
100.0000 ug | ORAL_TABLET | Freq: Every day | ORAL | Status: DC
Start: 2013-02-18 — End: 2013-04-07

## 2013-02-18 NOTE — Assessment & Plan Note (Signed)
Well controlled. Continue Effexor (although alternative could be considered due to hypertension)

## 2013-02-18 NOTE — Assessment & Plan Note (Signed)
S/p full excision by dermatology. Wound appears to be healing well. Plans for full body dermatology visit in 6 months with dermatology.

## 2013-02-18 NOTE — Assessment & Plan Note (Signed)
Patient plans to start regular exercise. Decreased alcohol consumption likely will help as well with weight loss.

## 2013-02-18 NOTE — Assessment & Plan Note (Addendum)
SBP >140 on 3 separate occasions now. Today, controlled at goal for age without medications. Continue to monitor. Continue change from effexor except symptoms are so well controlled that do not want to alter therapy at this time.   ASA advised for primary prevention of stroke.

## 2013-02-18 NOTE — Assessment & Plan Note (Signed)
Often 3 glasses of wine per day. Patient with strong family history alcoholism. Encouraged patient strongly to consider AA meetings. She refuses need for detox at this time. Follow up in 1 month.

## 2013-02-18 NOTE — Assessment & Plan Note (Signed)
Vitamin D levels wnl. Will add to medical history and resolve from problem list.

## 2013-02-18 NOTE — Assessment & Plan Note (Signed)
LDL goal <130 reasonable given risk factors of age and hypertension only. Follow up yearly.

## 2013-02-18 NOTE — Assessment & Plan Note (Signed)
tsh decreased (patient has typically run low or on lower end of normal limits). Will decrease synthroid to from 112. Follow up TSH in 6 weeks.

## 2013-02-20 ENCOUNTER — Telehealth: Payer: Self-pay | Admitting: Family Medicine

## 2013-02-20 NOTE — Telephone Encounter (Signed)
LMOM advising pt of lab results and the dec in synthroid. Letter sent to pt.

## 2013-02-20 NOTE — Telephone Encounter (Signed)
Message copied by Barnie Alderman on Mon Feb 20, 2013  9:02 AM ------      Message from: Shelva Majestic      Created: Sat Feb 18, 2013 11:00 PM       TSH low. Decreased synthroid to . Will ask nursing staff to inform patient. All other labs essentially normal (letter to be sent). ------

## 2013-04-03 ENCOUNTER — Encounter (HOSPITAL_COMMUNITY): Payer: Self-pay

## 2013-04-03 ENCOUNTER — Emergency Department (HOSPITAL_COMMUNITY)
Admission: EM | Admit: 2013-04-03 | Discharge: 2013-04-03 | Disposition: A | Discharge status: Home / Self Care | Payer: Medicare Other | Source: Home / Self Care | Attending: Family Medicine | Admitting: Family Medicine

## 2013-04-03 DIAGNOSIS — L259 Unspecified contact dermatitis, unspecified cause: Secondary | ICD-10-CM

## 2013-04-03 DIAGNOSIS — L309 Dermatitis, unspecified: Secondary | ICD-10-CM

## 2013-04-03 MED ORDER — PRAMOXINE HCL 1 % EX LOTN: 1.0000 "application " | TOPICAL_LOTION | Freq: Three times a day (TID) | CUTANEOUS | Status: DC | PRN

## 2013-04-03 MED ORDER — CETIRIZINE HCL 5 MG PO TABS: 5.0000 mg | ORAL_TABLET | Freq: Every evening | ORAL | Status: DC | PRN

## 2013-04-03 MED ORDER — TRIAMCINOLONE ACETONIDE 0.5 % EX OINT: TOPICAL_OINTMENT | Freq: Two times a day (BID) | CUTANEOUS | Status: DC

## 2013-04-03 NOTE — ED Notes (Signed)
Has eruptions both arms since this AM, which she states is due to an attempted platelet donation on 5-12 and 5-14; was advised by the facility that attempted to do the platelet collection to be evaluated for her hives

## 2013-04-03 NOTE — ED Provider Notes (Signed)
History     CSN: [id number]  Arrival date & time 04/03/13  1003   First MD Initiated Contact with Patient 04/03/13 1026      Chief Complaint  Patient presents with  . Urticaria    (Consider location/radiation/quality/duration/timing/severity/associated sxs/prior treatment) HPI Comments: 72 year old female with history of hypothyroidism among other chronic comorbidities. Here complaining of a pruriginous rash in her forearms and antecubital fossa for the last 2 days. Patient has donated platelets and there was an attempt of platelet donation on May 12 and then she donated platelets on May 14. Rash is more prominent in both venipuncture areas. Denies any general symptoms like general malaise, fever or chills. No headache no abdominal pain no nausea or vomiting.   Past Medical History  Diagnosis Date  . Depression   . Diverticulosis   . Cholecystolithiasis   . De Quervain's tenosynovitis   . DIVERTICULITIS, HX OF 06/27/2008  . De Quervain's tenosynovitis, left 09/09/2012  . Unspecified vitamin D deficiency 12/12/2008    Past Surgical History  Procedure Laterality Date  . Appendectomy    . Breast biopsy      no malignancy  . Tonsillectomy    . Vaginal hysterectomy      including cervix    Family History  Problem Relation Age of Onset  . Hepatitis Brother     hep C  . Heart failure Father     MI-65  . Hypertension Father   . Heart failure Mother     History  Substance Use Topics  . Smoking status: Never Smoker   . Smokeless tobacco: Never Used  . Alcohol Use: 7.5 oz/week    15 drink(s) per week    OB History   Grav Para Term Preterm Abortions TAB SAB Ect Mult Living                  Review of Systems  Constitutional: Negative for fever, chills, activity change, appetite change and fatigue.  HENT: Positive for sneezing. Negative for sore throat.   Eyes: Negative for itching.  Gastrointestinal: Negative for nausea, vomiting and abdominal pain.   Musculoskeletal: Negative for myalgias and arthralgias.  Skin: Positive for rash.  Allergic/Immunologic: Positive for environmental allergies.  Neurological: Negative for dizziness and headaches.  All other systems reviewed and are negative.    Allergies  Review of patient's allergies indicates no known allergies.  Home Medications   Current Outpatient Rx  Name  Route  Sig  Dispense  Refill  . aspirin 81 MG tablet                . citalopram (CELEXA) 10 MG tablet   Oral   Take 10 mg by mouth daily.         Marland Kitchen levothyroxine (SYNTHROID, LEVOTHROID) 100 MCG tablet   Oral   Take 1 tablet (100 mcg total) by mouth daily.   90 tablet   3   . simvastatin (ZOCOR) 40 MG tablet   Oral   Take 1 tablet (40 mg total) by mouth daily.   90 tablet   1   . cetirizine (ZYRTEC) 5 MG tablet   Oral   Take 1 tablet (5 mg total) by mouth at bedtime as needed for allergies.   30 tablet   0   . pramoxine (SARNA SENSITIVE) 1 % LOTN   Topical   Apply 1 application topically 3 (three) times daily as needed.   1 Bottle   0   . triamcinolone ointment (  KENALOG) 0.5 %   Topical   Apply topically 2 (two) times daily.   30 g   0     BP 120/75  Pulse 80  Temp(Src) 98.1 F (36.7 C) (Oral)  Resp 18  SpO2 98%  Physical Exam  Nursing note and vitals reviewed. Constitutional: She is oriented to person, place, and time. She appears well-developed and well-nourished. No distress.  HENT:  Head: Normocephalic and atraumatic.  Mouth/Throat: Oropharynx is clear and moist. No oropharyngeal exudate.  Eyes: Conjunctivae are normal. No scleral icterus.  Cardiovascular: Normal heart sounds.   Pulmonary/Chest: Breath sounds normal.  Lymphadenopathy:    She has no cervical adenopathy.  Neurological: She is alert and oriented to person, place, and time.  Skin: She is not diaphoretic.  Distal upper extremities: Generalized dry skin and sun damaged related changes. There is a Micropapular  pruriginous rash more confluent at the right dorsal radial wrist area around a venipuncture site and left antecubital fossa around venipuncture site. Minimally raised, No vesicles, crusting is or pustules, no significant base erythema or signs of cellulitis or infection.  Rest of the skin appears unaffected.    ED Course  Procedures (including critical care time)  Labs Reviewed - No data to display No results found.   1. Dermatitis       MDM  Impress possible delay hypersensitive reaction to antiseptic solution. Prescribed cetirizine, triamcinolone ointment and pramoxine based lotion. Also asked to make sure her thyroid level is normal as she has generalized dry skin and hypothyroidism related rash is a possibility. Supportive care and red flags that should prompt her return to medical attention discussed with patient and provided in writing.        Sharin Grave, MD 04/03/13 1105

## 2013-04-04 ENCOUNTER — Telehealth: Payer: Self-pay | Admitting: *Deleted

## 2013-04-04 NOTE — Telephone Encounter (Signed)
On may 12 pt tried to give platelets and was unable to use two needle system after multiple attempts, went again on Thursday and gave platelets using single needle system. Reports that she is now covered in multiple "bites' or some reaction to antiseptic - seen at [facility]. [facility] and [facility] test for cloraprep/iodine. Informed pt that we do not do these test - states she is not allergic to shellfish or regular rubbing alcohol  . Given ointment to apply by UC. Pt will keep appointment with Dr. Margot Ables on Friday. NO further questions or concerns. Wyatt Haste, RN-BSN

## 2013-04-07 ENCOUNTER — Ambulatory Visit (INDEPENDENT_AMBULATORY_CARE_PROVIDER_SITE_OTHER): Payer: Medicare Other | Admitting: Family Medicine

## 2013-04-07 ENCOUNTER — Encounter: Payer: Self-pay | Admitting: Family Medicine

## 2013-04-07 VITALS — BP 136/84 | HR 96 | Temp 98.2°F | Ht 65.7 in | Wt 174.0 lb

## 2013-04-07 DIAGNOSIS — R21 Rash and other nonspecific skin eruption: Secondary | ICD-10-CM

## 2013-04-07 DIAGNOSIS — F1020 Alcohol dependence, uncomplicated: Secondary | ICD-10-CM

## 2013-04-07 DIAGNOSIS — F329 Major depressive disorder, single episode, unspecified: Secondary | ICD-10-CM

## 2013-04-07 DIAGNOSIS — E039 Hypothyroidism, unspecified: Secondary | ICD-10-CM

## 2013-04-07 DIAGNOSIS — F3289 Other specified depressive episodes: Secondary | ICD-10-CM

## 2013-04-07 DIAGNOSIS — C44629 Squamous cell carcinoma of skin of left upper limb, including shoulder: Secondary | ICD-10-CM

## 2013-04-07 DIAGNOSIS — IMO0001 Reserved for inherently not codable concepts without codable children: Secondary | ICD-10-CM

## 2013-04-07 DIAGNOSIS — I10 Essential (primary) hypertension: Secondary | ICD-10-CM

## 2013-04-07 DIAGNOSIS — C44621 Squamous cell carcinoma of skin of unspecified upper limb, including shoulder: Secondary | ICD-10-CM

## 2013-04-07 MED ORDER — VENLAFAXINE HCL 100 MG PO TABS: 100.0000 mg | ORAL_TABLET | Freq: Two times a day (BID) | ORAL | Status: DC

## 2013-04-07 MED ORDER — SIMVASTATIN 40 MG PO TABS: 40.0000 mg | ORAL_TABLET | Freq: Every day | ORAL | Status: DC

## 2013-04-07 MED ORDER — LEVOTHYROXINE SODIUM 100 MCG PO TABS: 100.0000 ug | ORAL_TABLET | Freq: Every day | ORAL | Status: DC

## 2013-04-07 MED ORDER — PREDNISONE 50 MG PO TABS: ORAL_TABLET | ORAL | Status: DC

## 2013-04-07 NOTE — Assessment & Plan Note (Signed)
Applied iodine skin cleansor on R wrist to test for skin erruption. Cleaned skin w/ alcohol prep on R and L wrist Likely allergic reaction Cont triamcinolone PRN Prednisone 50mg  x5 days Consider further workup including Bx and Sed rate if not improving.

## 2013-04-07 NOTE — Patient Instructions (Addendum)
Thank you for comign in today You are doing great Please start taking the prednisone tomorrow morning with breakfast. Please come back if your rash does not improve. Please try to cut back on your drinking Have a great evening

## 2013-04-07 NOTE — Progress Notes (Signed)
Amber Allen is a 72 y.o. female who presents to [facility] today for rash and followup  R[facility]: [facility]. 10 day later rash at needle sites. puritis on arms. Triamcinolone w/ minimal benefit. Denies any environtmental exposures. Insect bites, fevers. No h/o similar rash.   ETOH abuse: Started after retiring. Only at night. Has not tried to cut back. Feels somewhat guilty about drinking as both parents major alcoholics, no agitation over discussing topic. Only at night w/ and after dinner. No eye opener in the morning. Typically 3 glasses of wine nightly or margarita or other cocktail. Only "drunk" 3 times in entire life. Extensive discussion had regarding liver life and tipping point of cirrhosis. Reviewed last liver numbers. Denies icterus, abd fullness, LE swelling, SOB. Not interested in AA or other programs.  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 No recurrence  Depression: Feels excellent. No complaints. Denies SI/HI. Likes her Venlafaxine   The following portions of the patient's history were reviewed and updated as appropriate: allergies, current medications, past medical history, family and social history, and problem list.  Patient is a nonsmoker.  Past Medical History  Diagnosis Date  . Depression   . Diverticulosis   . Cholecystolithiasis   . De Quervain's tenosynovitis   . DIVERTICULITIS, HX OF 06/27/2008  . De Quervain's tenosynovitis, left 09/09/2012  . Unspecified vitamin D deficiency 12/12/2008    ROS as above otherwise neg.    Medications reviewed. Current Outpatient Prescriptions  Medication Sig Dispense Refill  . aspirin 81 MG tablet        . cetirizine (ZYRTEC) 5 MG tablet Take 1 tablet (5 mg total) by mouth at bedtime as needed for allergies.  30 tablet  0  . citalopram (CELEXA) 10 MG tablet Take 10 mg by mouth daily.      Marland Kitchen levothyroxine (SYNTHROID, LEVOTHROID) 100 MCG tablet Take 1 tablet (100 mcg total) by mouth daily.  90 tablet  3  .  pramoxine (SARNA SENSITIVE) 1 % LOTN Apply 1 application topically 3 (three) times daily as needed.  1 Bottle  0  . simvastatin (ZOCOR) 40 MG tablet Take 1 tablet (40 mg total) by mouth daily.  90 tablet  1  . triamcinolone ointment (KENALOG) 0.5 % Apply topically 2 (two) times daily.  30 g  0   No current facility-administered medications for this visit.    Exam:  BP 136/84  Pulse 96  Temp(Src) 98.2 F (36.8 C) (Oral)  Ht 5' 5.7" (1.669 m)  Wt 174 lb (78.926 kg)  BMI 28.33 kg/m2 Gen: Well NAD HEENT: EOMI,  MMM Skin: puritic papular erythematous eruptions on forearms, most predominant in L antecubital fossa and R wrist at injection sites. L hand incisional scar well healed w/ some scar tissue present.    No results found for this or any previous visit (from the past 72 hour(s)).

## 2013-04-08 NOTE — Assessment & Plan Note (Signed)
No evidence of recurrance. Continue w/ gentle massage w/ lotion daily and sunblock for skin lesion to promote healing

## 2013-04-08 NOTE — Assessment & Plan Note (Signed)
Non-hypertensive today Well below goal No therapy needed Con ASA

## 2013-04-08 NOTE — Assessment & Plan Note (Signed)
Lengthy discussion had w/ pt concerning ETOH habit.  Pt + for CAGE and knows she has a drinking problem However pt very satisfied w/ current ETOH habit, but realizes the risk of continued abuse and will try to cut back Pt w/ strong fam h/o ETOH abuse Fortunately pt has not been a drinker her entire life.  Recent labs revieweed.  Pt not interested in joining any type of rehab program and feels she would be able to stop on her own.

## 2013-04-08 NOTE — Assessment & Plan Note (Signed)
Well controlled Continue current therapy 

## 2013-04-08 NOTE — Assessment & Plan Note (Signed)
Feels better since decreasing dose.  Continue on current dose

## 2013-05-19 ENCOUNTER — Telehealth: Payer: Self-pay | Admitting: Family Medicine

## 2013-05-19 NOTE — Telephone Encounter (Signed)
Patient is calling requesting a referral for an eye exam.  She has no preference of where to go but the last time she went someplace near the [facility] office.  She wants to let Dr. Evelena Peat know she took a class last week and she wasn't able to read the board.

## 2013-05-22 NOTE — Telephone Encounter (Signed)
Will forward to pcp

## 2013-05-26 NOTE — Telephone Encounter (Signed)
Pt is aware.  

## 2013-05-26 NOTE — Telephone Encounter (Signed)
No need for referral, just go to optometrist for eye appt. If she has other specific concerns that warrant Optho just let me know

## 2013-06-05 ENCOUNTER — Telehealth: Payer: Self-pay | Admitting: Family Medicine

## 2013-06-05 NOTE — Telephone Encounter (Signed)
Pt went to see the specialist and has been told she has cataracts and they want to schedule surgery. She wanted to know if she needs to see you before this happens or is it okay to go ahead with the surgery. JW

## 2013-06-05 NOTE — Telephone Encounter (Signed)
Pt is aware.  Amber Allen,CMA  

## 2013-06-05 NOTE — Telephone Encounter (Signed)
OK by me so long as surgeon is OK

## 2013-06-05 NOTE — Telephone Encounter (Signed)
Will forward to MD. Aneshia Jacquet,CMA  

## 2013-07-12 ENCOUNTER — Other Ambulatory Visit: Payer: Self-pay

## 2013-09-12 ENCOUNTER — Emergency Department (HOSPITAL_COMMUNITY)
Admission: EM | Admit: 2013-09-12 | Discharge: 2013-09-12 | Disposition: A | Discharge status: Home / Self Care | Payer: Medicare Other | Attending: Emergency Medicine | Admitting: Emergency Medicine

## 2013-09-12 ENCOUNTER — Encounter (HOSPITAL_COMMUNITY): Payer: Self-pay | Admitting: Emergency Medicine

## 2013-09-12 ENCOUNTER — Emergency Department (HOSPITAL_COMMUNITY): Payer: Medicare Other

## 2013-09-12 DIAGNOSIS — F329 Major depressive disorder, single episode, unspecified: Secondary | ICD-10-CM | POA: Insufficient documentation

## 2013-09-12 DIAGNOSIS — F29 Unspecified psychosis not due to a substance or known physiological condition: Secondary | ICD-10-CM | POA: Insufficient documentation

## 2013-09-12 DIAGNOSIS — Z862 Personal history of diseases of the blood and blood-forming organs and certain disorders involving the immune mechanism: Secondary | ICD-10-CM | POA: Insufficient documentation

## 2013-09-12 DIAGNOSIS — Z8639 Personal history of other endocrine, nutritional and metabolic disease: Secondary | ICD-10-CM | POA: Insufficient documentation

## 2013-09-12 DIAGNOSIS — F3289 Other specified depressive episodes: Secondary | ICD-10-CM | POA: Insufficient documentation

## 2013-09-12 DIAGNOSIS — Z79899 Other long term (current) drug therapy: Secondary | ICD-10-CM | POA: Insufficient documentation

## 2013-09-12 DIAGNOSIS — IMO0002 Reserved for concepts with insufficient information to code with codable children: Secondary | ICD-10-CM | POA: Insufficient documentation

## 2013-09-12 DIAGNOSIS — R410 Disorientation, unspecified: Secondary | ICD-10-CM

## 2013-09-12 DIAGNOSIS — Z8739 Personal history of other diseases of the musculoskeletal system and connective tissue: Secondary | ICD-10-CM | POA: Insufficient documentation

## 2013-09-12 DIAGNOSIS — Z8719 Personal history of other diseases of the digestive system: Secondary | ICD-10-CM | POA: Insufficient documentation

## 2013-09-12 LAB — CBC WITH DIFFERENTIAL/PLATELET
HCT: 43.4 % (ref 36.0–46.0)
Hemoglobin: 14.2 g/dL (ref 12.0–15.0)
Lymphocytes Relative: 17 % (ref 12–46)
Lymphs Abs: 1.3 10*3/uL (ref 0.7–4.0)
MCHC: 32.7 g/dL (ref 30.0–36.0)
Monocytes Absolute: 0.5 10*3/uL (ref 0.1–1.0)
Monocytes Relative: 7 % (ref 3–12)
Neutro Abs: 5.5 10*3/uL (ref 1.7–7.7)
RBC: 4.49 MIL/uL (ref 3.87–5.11)
WBC: 7.3 10*3/uL (ref 4.0–10.5)

## 2013-09-12 LAB — POCT I-STAT, CHEM 8
Chloride: 108 mEq/L (ref 96–112)
Creatinine, Ser: 1 mg/dL (ref 0.50–1.10)
Glucose, Bld: 97 mg/dL (ref 70–99)
Potassium: 4.3 mEq/L (ref 3.5–5.1)
Sodium: 142 mEq/L (ref 135–145)

## 2013-09-12 LAB — URINALYSIS, ROUTINE W REFLEX MICROSCOPIC
Bilirubin Urine: NEGATIVE
Glucose, UA: NEGATIVE mg/dL
Hgb urine dipstick: NEGATIVE
Specific Gravity, Urine: 1.018 (ref 1.005–1.030)
pH: 6.5 (ref 5.0–8.0)

## 2013-09-12 LAB — URINE MICROSCOPIC-ADD ON

## 2013-09-12 NOTE — ED Provider Notes (Addendum)
CSN: [id number]     Arrival date & time 09/12/13  0807 History   First MD Initiated Contact with Patient 09/12/13 [phone]     Chief Complaint  Patient presents with  . Altered Mental Status   (Consider location/radiation/quality/duration/timing/severity/associated sxs/prior Treatment) Patient is a 72 y.o. female presenting with altered mental status. The history is provided by the patient and a relative.  Altered Mental Status Presenting symptoms: confusion   Severity:  Mild Most recent episode:  Today Episode history:  Single Timing:  Constant Progression:  Resolved Chronicity:  New Context: not a nursing home resident and not a recent change in medication   Context comment:  States yesterday had to put her cat to sleep and she was very sad.  she  had terrible dreams last night and then when she woke up she felt confused.  she does admit to drinking wine last night and only took 1 dose of effexor yesterday Associated symptoms: depression   Associated symptoms: no abdominal pain, normal movement, no agitation, no bladder incontinence, no hallucinations, no palpitations, no slurred speech, no visual change and no weakness   Associated symptoms comment:  Hx of depression but under control   Past Medical History  Diagnosis Date  . Depression   . Diverticulosis   . Cholecystolithiasis   . De Quervain's tenosynovitis   . DIVERTICULITIS, HX OF 06/27/2008  . De Quervain's tenosynovitis, left 09/09/2012  . Unspecified vitamin D deficiency 12/12/2008   Past Surgical History  Procedure Laterality Date  . Appendectomy    . Breast biopsy      no malignancy  . Tonsillectomy    . Vaginal hysterectomy      including cervix   Family History  Problem Relation Age of Onset  . Hepatitis Brother     hep C  . Heart failure Father     MI-65  . Hypertension Father   . Heart failure Mother    History  Substance Use Topics  . Smoking status: Never Smoker   . Smokeless tobacco: Never Used  .  Alcohol Use: 7.5 oz/week    15 drink(s) per week   OB History   Grav Para Term Preterm Abortions TAB SAB Ect Mult Living                 Review of Systems  Cardiovascular: Negative for palpitations.  Gastrointestinal: Negative for abdominal pain.  Genitourinary: Negative for bladder incontinence.  Neurological: Negative for syncope, speech difficulty, weakness and numbness.  Psychiatric/Behavioral: Positive for confusion. Negative for hallucinations and agitation.  All other systems reviewed and are negative.    Allergies  Review of patient's allergies indicates no known allergies.  Home Medications   Current Outpatient Rx  Name  Route  Sig  Dispense  Refill  . aspirin 81 MG tablet                . cetirizine (ZYRTEC) 5 MG tablet   Oral   Take 1 tablet (5 mg total) by mouth at bedtime as needed for allergies.   30 tablet   0   . levothyroxine (SYNTHROID, LEVOTHROID) 100 MCG tablet   Oral   Take 1 tablet (100 mcg total) by mouth daily.   90 tablet   3   . pramoxine (SARNA SENSITIVE) 1 % LOTN   Topical   Apply 1 application topically 3 (three) times daily as needed.   1 Bottle   0   . predniSONE (DELTASONE) 50 MG tablet  Take one tablet daily with breakfast   5 tablet   0   . simvastatin (ZOCOR) 40 MG tablet   Oral   Take 1 tablet (40 mg total) by mouth daily.   90 tablet   3   . triamcinolone ointment (KENALOG) 0.5 %   Topical   Apply topically 2 (two) times daily.   30 g   0    BP 161/102  Pulse 95  Temp(Src) 98.3 F (36.8 C) (Oral)  Resp 20  SpO2 99% Physical Exam  Nursing note and vitals reviewed. Constitutional: She is oriented to person, place, and time. She appears well-developed and well-nourished. No distress.  HENT:  Head: Normocephalic and atraumatic.  Mouth/Throat: Oropharynx is clear and moist.  Eyes: Conjunctivae and EOM are normal. Pupils are equal, round, and reactive to light.  Neck: Normal range of motion. Neck  supple. Carotid bruit is not present.  Cardiovascular: Normal rate, regular rhythm and intact distal pulses.   No murmur heard. Pulmonary/Chest: Effort normal and breath sounds normal. No respiratory distress. She has no wheezes. She has no rales.  Abdominal: Soft. She exhibits no distension. There is no tenderness. There is no rebound and no guarding.  Musculoskeletal: Normal range of motion. She exhibits no edema and no tenderness.  Neurological: She is alert and oriented to person, place, and time. She has normal strength. No cranial nerve deficit or sensory deficit. Coordination and gait normal.  Normal speech and thought content  Skin: Skin is warm and dry. No rash noted. No erythema.  Psychiatric: She has a normal mood and affect. Her behavior is normal.    ED Course  Procedures (including critical care time) Labs Review Labs Reviewed  POCT I-STAT, CHEM 8 - Abnormal; Notable for the following:    Hemoglobin 15.3 (*)    All other components within normal limits  CBC WITH DIFFERENTIAL  URINALYSIS, ROUTINE W REFLEX MICROSCOPIC   Imaging Review Ct Head Wo Contrast  09/12/2013   CLINICAL DATA:  Confusion  EXAM: CT HEAD WITHOUT CONTRAST  TECHNIQUE: Contiguous axial images were obtained from the base of the skull through the vertex without intravenous contrast.  COMPARISON:  None.  FINDINGS: Mild chronic microvascular changes throughout the deep white matter. No acute intracranial abnormality. Specifically, no hemorrhage, hydrocephalus, mass lesion, acute infarction, or significant intracranial injury. No acute calvarial abnormality. Visualized paranasal sinuses and mastoids clear. Orbital soft tissues unremarkable.  IMPRESSION: No acute intracranial abnormality.   Electronically Signed   By: Charlett Nose M.D.   On: 09/12/2013 09:08    EKG Interpretation     Ventricular Rate:  87 PR Interval:  170 QRS Duration: 68 QT Interval:  428 QTC Calculation: 515 R Axis:   55 Text  Interpretation:  Sinus arrhythmia Borderline T wave abnormalities Prolonged QT interval No previous tracing            MDM   1. Confusion     Patient with symptoms today complaining of confusion.  However patient can account for every hour that she was doing something yesterday and can remember everything that happened this morning. She states that her cat had to be put to sleep yesterday and she was very emotional. She states last night she had very unusual dreams that when she woke up she felt a little disoriented and confused. She initially forgot that she put her cat to sleep but then remembered.  She spoke with percent he states she seemed a little confused but was not  slurring her speech or having any symptoms suggestive of word salad. She denied any gait disturbance, numbness, weakness, vision changes. On exam patient has a normal neurologic exam and can recall at that time. She did drink some wine last night and did not take her normal 2 doses of Effexor.  Given normal exam and patient strongly feel this is most likely a grief reaction to her emotional upset yesterday. She has no risk factors for stroke except for age.  CT was negative and labs were all within normal limits. Discussed findings with patient and her family they're comfortable following up with her PCP if the other again this is very low likelihood of TIA or stroke and has much more likely to be from grief.  11:03 AM Urine is contaminated but no urinary sx and culture sent.  Do not feel pt needs abx at this time.  Gwyneth Sprout, MD 09/12/13 1104  Gwyneth Sprout, MD 09/12/13 1112

## 2013-09-12 NOTE — ED Notes (Signed)
Patient transported to CT 

## 2013-09-12 NOTE — ED Notes (Signed)
Pt states having confusion today, states "I think I lost yesterday, I put my cat down yesterday"; pt denies any other weakness or stroke s/s

## 2013-09-13 LAB — URINE CULTURE: Colony Count: >=100000

## 2013-11-09 DIAGNOSIS — C801 Malignant (primary) neoplasm, unspecified: Secondary | ICD-10-CM

## 2013-11-09 HISTORY — DX: Malignant (primary) neoplasm, unspecified: C80.1

## 2013-12-29 ENCOUNTER — Telehealth: Payer: Self-pay | Admitting: Family Medicine

## 2013-12-29 NOTE — Telephone Encounter (Signed)
FYI

## 2013-12-29 NOTE — Telephone Encounter (Signed)
Patient call to advise that there will be paperwork from a new pharmacy requesting RX for her Synthroid. Her insurance has changed and requires her to change her pharmacy as well. Please be advised.

## 2014-01-05 NOTE — Telephone Encounter (Signed)
Will forward to MD to see if he received any papers on her medication. Amber Allen,CMA

## 2014-01-05 NOTE — Telephone Encounter (Signed)
Pt called and wanted to know if her new pharmacy had faxed papers wanting more information about her medications? Please call her because she is now worried that she will not have any medication when it is time. jw

## 2014-01-08 NOTE — Telephone Encounter (Signed)
Paperwork filled out and ready to fax.   Linna Darner, MD Family Medicine PGY-3 01/08/2014, 12:17 PM

## 2014-01-16 ENCOUNTER — Encounter: Payer: Self-pay | Admitting: Family Medicine

## 2014-01-16 ENCOUNTER — Ambulatory Visit (INDEPENDENT_AMBULATORY_CARE_PROVIDER_SITE_OTHER): Payer: Medicare HMO | Admitting: Family Medicine

## 2014-01-16 VITALS — BP 128/77 | HR 78 | Temp 98.2°F | Wt 179.0 lb

## 2014-01-16 DIAGNOSIS — C44621 Squamous cell carcinoma of skin of unspecified upper limb, including shoulder: Secondary | ICD-10-CM

## 2014-01-16 DIAGNOSIS — J310 Chronic rhinitis: Secondary | ICD-10-CM

## 2014-01-16 DIAGNOSIS — E7800 Pure hypercholesterolemia, unspecified: Secondary | ICD-10-CM

## 2014-01-16 DIAGNOSIS — E039 Hypothyroidism, unspecified: Secondary | ICD-10-CM

## 2014-01-16 LAB — COMPREHENSIVE METABOLIC PANEL
ALBUMIN: 4 g/dL (ref 3.5–5.2)
ALK PHOS: 76 U/L (ref 39–117)
ALT: 20 U/L (ref 0–35)
AST: 20 U/L (ref 0–37)
BUN: 12 mg/dL (ref 6–23)
CO2: 28 mEq/L (ref 19–32)
Calcium: 9.2 mg/dL (ref 8.4–10.5)
Chloride: 106 mEq/L (ref 96–112)
Creat: 0.78 mg/dL (ref 0.50–1.10)
Glucose, Bld: 71 mg/dL (ref 70–99)
POTASSIUM: 4 meq/L (ref 3.5–5.3)
Sodium: 140 mEq/L (ref 135–145)
Total Bilirubin: 0.4 mg/dL (ref 0.2–1.2)
Total Protein: 6.4 g/dL (ref 6.0–8.3)

## 2014-01-16 MED ORDER — IPRATROPIUM BROMIDE 0.06 % NA SOLN: 2.0000 | Freq: Four times a day (QID) | NASAL | Status: DC

## 2014-01-16 MED ORDER — FLUTICASONE PROPIONATE 50 MCG/ACT NA SUSP: 2.0000 | Freq: Every day | NASAL | Status: DC

## 2014-01-16 NOTE — Assessment & Plan Note (Signed)
No symptoms of low or high thyroid levels TSH today

## 2014-01-16 NOTE — Patient Instructions (Signed)
Start the flonase at night for at least 2-4 weeks Start the atrovent during the day Start nasal saline in the morning to wash out your nose Try over there counter allergy medications Continue all of your other medications as prescribed I'll call  You if any thing comes back abnormal

## 2014-01-16 NOTE — Assessment & Plan Note (Signed)
Cont simvastatin CMET today

## 2014-01-16 NOTE — Assessment & Plan Note (Signed)
Pt to try OTC allegra or zyrtec Start nasal saline in the am, atrovent during the day, and flonase at night

## 2014-01-16 NOTE — Assessment & Plan Note (Signed)
Seeing derm for multiple suspisious lesions

## 2014-01-16 NOTE — Progress Notes (Signed)
Amber Allen is a 73 y.o. female who presents to [facility] today for routine physical.   Nasal congestion: present for months. H/o allergies. No real coughing. Unsure of triggers.   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   Thyroid: taking syuntrhoid. No palpitations, heat/coldintlerance  HLD: taking simvastatin. No CP, palp, SOB  The following portions of the patient's history were reviewed and updated as appropriate: allergies, current medications, past medical history, family and social history, and problem list.  Patient is a nonsmoker.  Past Medical History  Diagnosis Date  . Depression   . Diverticulosis   . Cholecystolithiasis   . De Quervain's tenosynovitis   . DIVERTICULITIS, HX OF 06/27/2008  . De Quervain's tenosynovitis, left 09/09/2012  . Unspecified vitamin D deficiency 12/12/2008    ROS as above otherwise neg.    Medications reviewed. Current Outpatient Prescriptions  Medication Sig Dispense Refill  . aspirin 81 MG tablet        . levothyroxine (SYNTHROID, LEVOTHROID) 100 MCG tablet Take 1 tablet (100 mcg total) by mouth daily.  90 tablet  3  . Multiple Vitamins-Minerals (MULTIVITAMIN & MINERAL PO) Take 1 tablet by mouth daily.      . simvastatin (ZOCOR) 40 MG tablet Take 1 tablet (40 mg total) by mouth daily.  90 tablet  3  . venlafaxine (EFFEXOR) 100 MG tablet Take 100 mg by mouth 2 (two) times daily.      . fluticasone (FLONASE) 50 MCG/ACT nasal spray Place 2 sprays into both nostrils at bedtime.  16 g  6  . ipratropium (ATROVENT) 0.06 % nasal spray Place 2 sprays into both nostrils 4 (four) times daily.  15 mL  12   No current facility-administered medications for this visit.    Exam:  BP 128/77  Pulse 78  Temp(Src) 98.2 F (36.8 C) (Oral)  Wt 179 lb (81.194 kg) Gen: Well NAD HEENT: EOMI,  MMM pharyngeal cobblestoning, nasal turbinates boggy Lungs: CTABL Nl WOB Heart: RRR no MRG Abd: NABS, NT, ND Exts: Non edematous BL  LE, warm and  well perfused. ,  No results found for this or any previous visit (from the past 72 hour(s)).  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

## 2014-01-17 LAB — TSH: TSH: 1.028 u[IU]/mL (ref 0.350–4.500)

## 2014-04-03 ENCOUNTER — Telehealth: Payer: Self-pay | Admitting: Family Medicine

## 2014-04-03 MED ORDER — VENLAFAXINE HCL 100 MG PO TABS: 100.0000 mg | ORAL_TABLET | Freq: Two times a day (BID) | ORAL | Status: DC

## 2014-04-03 MED ORDER — SIMVASTATIN 40 MG PO TABS: 40.0000 mg | ORAL_TABLET | Freq: Every day | ORAL | Status: DC

## 2014-04-03 MED ORDER — LEVOTHYROXINE SODIUM 100 MCG PO TABS: 100.0000 ug | ORAL_TABLET | Freq: Every day | ORAL | Status: DC

## 2014-04-03 NOTE — Telephone Encounter (Signed)
Pt called and needs her refills sent to her NEW pharmacy. [facility]. She needs Simvastatin, Levothyroxine, and Venlafaxine. jw

## 2014-04-19 ENCOUNTER — Telehealth: Payer: Self-pay | Admitting: Family Medicine

## 2014-04-19 NOTE — Telephone Encounter (Signed)
Has bites all over body. Itching is driving her crazy. This has been going on over a week Please advise

## 2014-04-20 NOTE — Telephone Encounter (Signed)
Left voice message for pt to return nurse call regarding bites and itching.  Derl Barrow, RN

## 2014-04-20 NOTE — Telephone Encounter (Signed)
Pt called regarding two different bug bites.  Pt is itching really bad and this issue has been going on for over a week now.  Pt has tried several over the counter medications for itching. Pt stated she can not take any antihistamine due to thyroid issue.  Pt stated OTC relieves the itching for a little while but once it wears off it is worse than before and there are wellts.  Pt advised to schedule an appt with provider.  Pt stated she will continue use of OTC medication and will call to schedule an appt.  Pt advised to go to urgent care over week if she feels it is necessary.  Will forward to PCP for further advise.  Derl Barrow, RN

## 2014-04-20 NOTE — Telephone Encounter (Signed)
Pt called back and would like Tamika to try and call her again. jw

## 2014-04-23 NOTE — Telephone Encounter (Signed)
Left voice message checking regarding the itching from over the weekend.  Informed pt is she stills need an appt; please schedule one with a provider that has open slots.  Derl Barrow, RN

## 2014-04-23 NOTE — Telephone Encounter (Signed)
Sounds good. She can see anyone in our office if she can't get in to see me.

## 2014-04-24 ENCOUNTER — Encounter: Payer: Self-pay | Admitting: Emergency Medicine

## 2014-04-24 ENCOUNTER — Ambulatory Visit (INDEPENDENT_AMBULATORY_CARE_PROVIDER_SITE_OTHER): Payer: Medicare HMO | Admitting: Emergency Medicine

## 2014-04-24 VITALS — BP 156/96 | HR 72 | Temp 98.1°F | Wt 176.0 lb

## 2014-04-24 DIAGNOSIS — R21 Rash and other nonspecific skin eruption: Secondary | ICD-10-CM

## 2014-04-24 MED ORDER — PERMETHRIN 5 % EX CREA: 1.0000 "application " | TOPICAL_CREAM | Freq: Once | CUTANEOUS | Status: DC

## 2014-04-24 MED ORDER — HYDROCORTISONE 1 % EX LOTN: 1.0000 "application " | TOPICAL_LOTION | Freq: Two times a day (BID) | CUTANEOUS | Status: DC | PRN

## 2014-04-24 NOTE — Patient Instructions (Signed)
It was nice to meet you!  It looks like you have fleas in your yard. I would call an exterminator about getting rid of them.  In the meantime, use the hydrocortisone cream twice a day as needed for itching. I also sent in permethrin cream.  This is to get rid of any bugs that are in your skin.  Follow up in 1 week if not better.

## 2014-04-24 NOTE — Progress Notes (Signed)
   Subjective:    Patient ID: Amber Allen, female    DOB: August 31, 1941, 73 y.o.   MRN: [id number]  HPI Amber Allen is here for a same-day appointment for bug bites.  She states about 10 days ago she started getting itchy bumps on her feet and around her belt line. These are extremely itchy. She has been using baking soda and oatmeal baths, witch hazel without much improvement. She did have some cortisone cream that she used and was helpful, however this has run out. She does bring in some bugs in zip lock bags today that she found on her legs. They are fleas. No fevers or chills. No new medications, lotions, detergents.  Current Outpatient Prescriptions on File Prior to Visit  Medication Sig Dispense Refill  . aspirin 81 MG tablet        . fluticasone (FLONASE) 50 MCG/ACT nasal spray Place 2 sprays into both nostrils at bedtime.  16 g  6  . ipratropium (ATROVENT) 0.06 % nasal spray Place 2 sprays into both nostrils 4 (four) times daily.  15 mL  12  . levothyroxine (SYNTHROID, LEVOTHROID) 100 MCG tablet Take 1 tablet (100 mcg total) by mouth daily.  90 tablet  3  . Multiple Vitamins-Minerals (MULTIVITAMIN & MINERAL PO) Take 1 tablet by mouth daily.      . simvastatin (ZOCOR) 40 MG tablet Take 1 tablet (40 mg total) by mouth daily.  90 tablet  3  . venlafaxine (EFFEXOR) 100 MG tablet Take 1 tablet (100 mg total) by mouth 2 (two) times daily.  180 tablet  3   No current facility-administered medications on file prior to visit.    I have reviewed and updated the following as appropriate: allergies and current medications SHx: non smoker   Review of Systems See HPI    Objective:   Physical Exam BP 156/96  Pulse 72  Temp(Src) 98.1 F (36.7 C) (Oral)  Wt 176 lb (79.833 kg) Gen: alert, cooperative, NAD Skin: erythematous papules with overlying excoriations on feet/ankles, belt line and underwear line      Assessment & Plan:

## 2014-04-24 NOTE — Assessment & Plan Note (Signed)
Secondary to flea bites. Given location along underwear and beltline, some concern for additional arthropods, such as scabies. Will treat with permethrin cream. Also recommended hot water washes for her bedding and clothing. Hydrocortisone cream given for symptom control. Suggested treating her lawn with an arthropod killing substance as she does not have carpeting or pets as a source of wheeze. Followup if no improvement in one week.

## 2014-07-21 ENCOUNTER — Encounter: Payer: Self-pay | Admitting: Family Medicine

## 2015-01-18 ENCOUNTER — Encounter: Payer: Self-pay | Admitting: Family Medicine

## 2015-01-18 ENCOUNTER — Ambulatory Visit (INDEPENDENT_AMBULATORY_CARE_PROVIDER_SITE_OTHER): Payer: Medicare HMO | Admitting: Family Medicine

## 2015-01-18 ENCOUNTER — Telehealth: Payer: Self-pay | Admitting: Family Medicine

## 2015-01-18 VITALS — BP 153/95 | HR 83 | Temp 98.2°F | Ht 66.0 in | Wt 172.0 lb

## 2015-01-18 DIAGNOSIS — R739 Hyperglycemia, unspecified: Secondary | ICD-10-CM | POA: Diagnosis not present

## 2015-01-18 DIAGNOSIS — J310 Chronic rhinitis: Secondary | ICD-10-CM

## 2015-01-18 DIAGNOSIS — IMO0001 Reserved for inherently not codable concepts without codable children: Secondary | ICD-10-CM

## 2015-01-18 DIAGNOSIS — R030 Elevated blood-pressure reading, without diagnosis of hypertension: Secondary | ICD-10-CM

## 2015-01-18 DIAGNOSIS — E7800 Pure hypercholesterolemia, unspecified: Secondary | ICD-10-CM

## 2015-01-18 DIAGNOSIS — Z23 Encounter for immunization: Secondary | ICD-10-CM | POA: Diagnosis not present

## 2015-01-18 DIAGNOSIS — F32A Depression, unspecified: Secondary | ICD-10-CM

## 2015-01-18 DIAGNOSIS — E039 Hypothyroidism, unspecified: Secondary | ICD-10-CM

## 2015-01-18 DIAGNOSIS — E038 Other specified hypothyroidism: Secondary | ICD-10-CM | POA: Diagnosis not present

## 2015-01-18 DIAGNOSIS — F329 Major depressive disorder, single episode, unspecified: Secondary | ICD-10-CM

## 2015-01-18 LAB — BASIC METABOLIC PANEL
BUN: 12 mg/dL (ref 6–23)
CHLORIDE: 106 meq/L (ref 96–112)
CO2: 26 mEq/L (ref 19–32)
Calcium: 9.5 mg/dL (ref 8.4–10.5)
Creat: 0.86 mg/dL (ref 0.50–1.10)
Glucose, Bld: 83 mg/dL (ref 70–99)
POTASSIUM: 4.6 meq/L (ref 3.5–5.3)
SODIUM: 140 meq/L (ref 135–145)

## 2015-01-18 LAB — POCT GLYCOSYLATED HEMOGLOBIN (HGB A1C): HEMOGLOBIN A1C: 5.3

## 2015-01-18 MED ORDER — VENLAFAXINE HCL 100 MG PO TABS
100.0000 mg | ORAL_TABLET | Freq: Two times a day (BID) | ORAL | Status: DC
Start: 2015-01-18 — End: 2015-01-21

## 2015-01-18 MED ORDER — FLUTICASONE PROPIONATE 50 MCG/ACT NA SUSP: 2.0000 | Freq: Every day | NASAL | Status: DC

## 2015-01-18 MED ORDER — SIMVASTATIN 40 MG PO TABS: 40.0000 mg | ORAL_TABLET | Freq: Every day | ORAL | Status: DC

## 2015-01-18 MED ORDER — LEVOTHYROXINE SODIUM 100 MCG PO TABS: 100.0000 ug | ORAL_TABLET | Freq: Every day | ORAL | Status: DC

## 2015-01-18 NOTE — Telephone Encounter (Signed)
Rxs for simtastatin,venlafaxine and fluticasone need to go the the [facility] order pharmacy.[facility] Ph# is [phone].  Please call today.  Patient haven't picked up that was sent to [facility].

## 2015-01-18 NOTE — Patient Instructions (Signed)
If your lab results are normal you can expect a letter in the mail.

## 2015-01-19 LAB — TSH: TSH: 0.113 u[IU]/mL — AB (ref 0.350–4.500)

## 2015-01-21 MED ORDER — VENLAFAXINE HCL 100 MG PO TABS: 100.0000 mg | ORAL_TABLET | Freq: Two times a day (BID) | ORAL | Status: DC

## 2015-01-21 MED ORDER — FLUTICASONE PROPIONATE 50 MCG/ACT NA SUSP: 2.0000 | Freq: Every day | NASAL | Status: DC

## 2015-01-21 MED ORDER — SIMVASTATIN 40 MG PO TABS: 40.0000 mg | ORAL_TABLET | Freq: Every day | ORAL | Status: DC

## 2015-01-21 NOTE — Telephone Encounter (Signed)
Prescriptions sent through mail order through [facility]. Sotirios Navarro, CMA.

## 2015-01-21 NOTE — Assessment & Plan Note (Signed)
Stable. Check TSH today. Refills given for synthroid.

## 2015-01-21 NOTE — Assessment & Plan Note (Signed)
Stable though symptoms not completely controlled, still having rhinorrhea. Pt had ?tried OTC allergy meds but not taking currently. Can continue flonase.

## 2015-01-21 NOTE — Assessment & Plan Note (Signed)
Stable, controlled. Continue venlafaxine.

## 2015-01-21 NOTE — Telephone Encounter (Signed)
I tried calling the phone number listed, stated patient not found when I typed in birthdate (I had to use group NPI since it didn't recognize mine)? Can you try calling and see if you have better luck? Prescriptions are: Simvastatin 40mg  once daily, disp #90, refills 3. Venlafaxine 100mg  twice daily, disp #180, refills 3. Fluticasone 71mcg/act, disp 16g refill #11. Thanks.

## 2015-01-21 NOTE — Progress Notes (Signed)
   Subjective:    Patient ID: Amber Allen, female    DOB: 1941-04-15, 74 y.o.   MRN: [id number]  HPI  CC: annual  # Healthcare maintenance:  Due for colonoscopy, mammogram. Pt declines both of these.  Also due for tdap, PNA vaccine  # High blood pressure  Never diagnosed with HTN in the past ROS: no CP, no SOB, no HA  # Hypothyroidism  Stable, has been on synthroid with no recent changes in dosage ROS: no weight changes  # Hyperlipidemia  Stable, taking simvastatin ROS: no muscle aches  # Depression  Stable, has been on venlafaxine for multiple years  Denies any current symptoms  Denies SI/HI  # Ayeaaaallergies/runny nose  Chronic issue for her. She states cannot take allergy medicine  Does use flonase  Review of Systems   See HPI for ROS. All other systems reviewed and are negative.  Past medical history, surgical, family, and social history reviewed and updated in the EMR as appropriate. Objective:  BP 153/95 mmHg  Pulse 83  Temp(Src) 98.2 F (36.8 C) (Oral)  Ht 5\' 6"  (1.676 m)  Wt 172 lb (78.019 kg)  BMI 27.77 kg/m2 Vitals and nursing note reviewed  General: NAD CV: RRR, normal heart sounds, no mrg. 2+ radial pulses Resp: CTAB, normal effort Ext: no edema or cyanosis Skin: no rashes Neuro: alert and oriented, no focal deficits Psych: mood normal, affect congruent  Assessment & Plan:  See Problem List Documentation

## 2015-01-21 NOTE — Assessment & Plan Note (Signed)
Several vitals from past year not at goal. Pt to return in 2 weeks, if still elevated will start anti-HTN

## 2015-01-21 NOTE — Assessment & Plan Note (Signed)
Stable. Refill for simvastatin.

## 2015-01-30 ENCOUNTER — Encounter: Payer: Self-pay | Admitting: Family Medicine

## 2015-01-30 NOTE — Telephone Encounter (Signed)
Will forward to PCP for advice. Avy Barlett, CMA. 

## 2015-01-31 ENCOUNTER — Telehealth: Payer: Self-pay | Admitting: Family Medicine

## 2015-01-31 ENCOUNTER — Other Ambulatory Visit: Payer: Self-pay | Admitting: Family Medicine

## 2015-01-31 DIAGNOSIS — E039 Hypothyroidism, unspecified: Secondary | ICD-10-CM

## 2015-01-31 NOTE — Telephone Encounter (Signed)
Left message stating would reply to her mychart message. I wanted to discuss this over the phone but will reply to message and if she still has questions I will call her. -Dr. Lamar Benes

## 2015-03-04 ENCOUNTER — Other Ambulatory Visit: Payer: Medicare HMO

## 2015-03-04 DIAGNOSIS — E039 Hypothyroidism, unspecified: Secondary | ICD-10-CM

## 2015-03-04 NOTE — Progress Notes (Signed)
[facility] phlebotomist drew:  TSH

## 2015-03-05 LAB — TSH: TSH: 0.265 u[IU]/mL — AB (ref 0.350–4.500)

## 2015-03-11 ENCOUNTER — Telehealth: Payer: Self-pay | Admitting: Family Medicine

## 2015-03-11 DIAGNOSIS — E039 Hypothyroidism, unspecified: Secondary | ICD-10-CM

## 2015-03-11 MED ORDER — LEVOTHYROXINE SODIUM 88 MCG PO TABS: 88.0000 ug | ORAL_TABLET | Freq: Every day | ORAL | Status: DC

## 2015-03-11 NOTE — Telephone Encounter (Signed)
Called pt and discussed recent TSH result, which is better but still low. I will decrease her synthroid dose to 17mcg, can continue taking 132mcg until she gets the new tablets. Future order placed for repeat TSH in 6-8 weeks, pt can call clinic and schedule this at her convenience. -Dr. Lamar Benes

## 2015-04-30 ENCOUNTER — Other Ambulatory Visit: Payer: Self-pay | Admitting: *Deleted

## 2015-04-30 MED ORDER — VENLAFAXINE HCL 100 MG PO TABS: 100.0000 mg | ORAL_TABLET | Freq: Two times a day (BID) | ORAL | Status: DC

## 2015-05-07 ENCOUNTER — Other Ambulatory Visit: Payer: Medicare HMO

## 2015-05-07 DIAGNOSIS — E039 Hypothyroidism, unspecified: Secondary | ICD-10-CM

## 2015-05-07 LAB — TSH: TSH: 0.802 u[IU]/mL (ref 0.350–4.500)

## 2015-05-07 NOTE — Progress Notes (Signed)
tsh done today Amber Allen 

## 2015-06-06 ENCOUNTER — Ambulatory Visit (INDEPENDENT_AMBULATORY_CARE_PROVIDER_SITE_OTHER): Payer: Medicare HMO | Admitting: Family Medicine

## 2015-06-06 ENCOUNTER — Encounter: Payer: Self-pay | Admitting: Family Medicine

## 2015-06-06 VITALS — BP 171/78 | HR 85 | Temp 98.1°F | Ht 66.0 in | Wt 175.2 lb

## 2015-06-06 DIAGNOSIS — K921 Melena: Secondary | ICD-10-CM | POA: Diagnosis not present

## 2015-06-06 LAB — CBC
HCT: 37 % (ref 36.0–46.0)
HEMOGLOBIN: 12.7 g/dL (ref 12.0–15.0)
MCH: 32.1 pg (ref 26.0–34.0)
MCHC: 34.3 g/dL (ref 30.0–36.0)
MCV: 93.4 fL (ref 78.0–100.0)
MPV: 10.6 fL (ref 8.6–12.4)
PLATELETS: 270 10*3/uL (ref 150–400)
RBC: 3.96 MIL/uL (ref 3.87–5.11)
RDW: 13.8 % (ref 11.5–15.5)
WBC: 8.8 10*3/uL (ref 4.0–10.5)

## 2015-06-06 LAB — POCT HEMOGLOBIN: Hemoglobin: 12.8 g/dL (ref 12.2–16.2)

## 2015-06-06 LAB — HEMOCCULT GUIAC POC 1CARD (OFFICE): FECAL OCCULT BLD: NEGATIVE

## 2015-06-06 NOTE — Patient Instructions (Signed)
Thank you for coming in,   I will call or write you a letter results from today.  I have also made a referral back to Dr. Almyra Free for repeat colonoscopy.  If you continue to have excessive amounts of blood from the rectum and started to have shortness of breath or feeling fatigued, then please seek immediate care.  Please bring all of your medications with you to each visit.    Please feel free to call with any questions or concerns at any time, at [phone]. --Dr. Raeford Razor

## 2015-06-06 NOTE — Progress Notes (Signed)
   Subjective:    Patient ID: Amber Allen, female    DOB: Mar 17, 1941, 74 y.o.   MRN: [id number]  Seen for Same day visit for   CC: blood in stool   Started yesterday.  Blood is bright red. Reported blood and mucous 5-6 times over the past day intermixed with stool.   No pain but having gas.  No history of prior episodes of blood per rectum No reported tearing sensation  Hx of diverticulitis. Reported in 1995. Hospitalized for the pain.  Takes ASA 81 mg  Reports stool as pellets.  She reports feeling well otherwise.  Never smoker  Hx of colonoscopy at 74 yo No travel. No undercooked ground beef.  No water drinking from stream or lake.   Upon chart review patient has been seen by Dr. Almyra Free in 2010 for colonoscopy. He noticed diverticula as well as a sessile polyp. A biopsy was taken but no information is found from this biopsy. He recommended follow-up for colonoscopy in 5 years.  Review of Systems   See HPI for ROS. Objective:  BP 171/78 mmHg  Pulse 85  Temp(Src) 98.1 F (36.7 C) (Oral)  Ht 5\' 6"  (1.676 m)  Wt 175 lb 3.2 oz (79.47 kg)  BMI 28.29 kg/m2  General: NAD Extremities: no edema or cyanosis. Capillary refill brisk Ab: Soft, nontender nondistended, positive bowel sounds, no rebound or guarding Rectal: Normal sphincter tone, no pain with palpation, no internal or external hemorrhoids palpated, no anal fissures appreciated Skin: warm and dry, no rashes noted      Assessment & Plan:  See Problem List Documentation

## 2015-06-06 NOTE — Assessment & Plan Note (Signed)
Patient was well on exam today. She is not tachycardic and blood pressure is slightly elevated and not hypotensive. History colonoscopy is 74 years old that recommended a follow-up from 5 years with sessile polyp with biopsy. Colonoscopy also showed hemorrhoids and diverticula. No anal fissures appreciated on exam. Blood could be secondary to diverticular versus polyp versus internal hemorrhoids. She is asymptomatic currently and point-of-care hemoglobin is 12.8 with a baseline of roughly 14. -CBC as well today. -Advised to stop taking aspirin -Advised with any further bouts of large blood in the toilet bowl then she should be seen immediately in the emergency department -Urgent referral's place to GI, Dr. Carol Ada, and scheduled for Monday -Case discussed with Dr. Lindell Noe

## 2015-07-24 ENCOUNTER — Encounter: Payer: Self-pay | Admitting: Family Medicine

## 2015-08-19 ENCOUNTER — Encounter: Payer: Self-pay | Admitting: Family Medicine

## 2015-08-19 ENCOUNTER — Ambulatory Visit (INDEPENDENT_AMBULATORY_CARE_PROVIDER_SITE_OTHER): Payer: Medicare HMO | Admitting: Family Medicine

## 2015-08-19 VITALS — BP 152/97 | HR 85 | Temp 98.2°F | Ht 67.0 in | Wt 175.4 lb

## 2015-08-19 DIAGNOSIS — F329 Major depressive disorder, single episode, unspecified: Secondary | ICD-10-CM

## 2015-08-19 DIAGNOSIS — F32A Depression, unspecified: Secondary | ICD-10-CM

## 2015-08-19 DIAGNOSIS — J310 Chronic rhinitis: Secondary | ICD-10-CM

## 2015-08-19 MED ORDER — MONTELUKAST SODIUM 10 MG PO TABS: 10.0000 mg | ORAL_TABLET | Freq: Every day | ORAL | Status: DC

## 2015-08-19 MED ORDER — CETIRIZINE HCL 10 MG PO TABS: 10.0000 mg | ORAL_TABLET | Freq: Every day | ORAL | Status: DC

## 2015-08-19 NOTE — Patient Instructions (Signed)
Start taking the zyrtec, flonase, singulair every day. If after a month still having issues, call us and we will refer to ENT.

## 2015-08-20 NOTE — Assessment & Plan Note (Signed)
Worsened symptoms. Would like to get this issue under control so that she can donate platelets/blood. Recommended restarting antihistamine/zyrtec, flonase, add singulair. Follow up in 1 month to evaluate symptoms, if still not well controlled feel given her desire for blood donation would have her see an ENT to see if there is anything else she can be offered.

## 2015-08-20 NOTE — Assessment & Plan Note (Signed)
Reports short mild worsening of symptoms which have now resolved. PHQ9 score 0. No SI. Continue effexor. Follow up in 6 months or as needed.

## 2015-08-20 NOTE — Progress Notes (Signed)
   Subjective:    Patient ID: Amber Allen, female    DOB: [birth year], 74 y.o.   MRN: [id number]  HPI  CC: sinus issues  # Chronic rhinitis:  This has been a lifelong issue for her, has tried flonase, antihistamines, allergy shots in the past  She was most recently using flonase  Wants to donate platelets/blood but was turned away because she was having "sniffles"  She does get viral/sinus infections several times a year, getting over one currently. ROS: no fevers/chills, no SOB, no nausea/vomiting, no diarrhea/constipation  # Depression  Says she had a mild episode of depression recently  Was sleeping in a lot, sometime into the afternoon  She says daughter was very strict with her and called her every morning to get her up, did this for 2 weeks and has now felt normal without any issues  Denies feelings of current depression or sadness  Denies SI or HI, says in the past she has been told she was "passively" suicidal  Makes a comment about drinking "a lot" of wine daily  Social Hx: never smoker, current daily alcohol user  Review of Systems   See HPI for ROS.   Past medical history, surgical, family, and social history reviewed and updated in the EMR as appropriate. Objective:  BP 152/97 mmHg  Pulse 85  Temp(Src) 98.2 F (36.8 C) (Oral)  Ht 5\' 7"  (1.702 m)  Wt 175 lb 6 oz (79.55 kg)  BMI 27.46 kg/m2 Vitals and nursing note reviewed  General: NAD HEENT: Bilateral swollen and erythematous turbinates both nares, small amount of clear rhinorrhea. CV: RRR, normal s1s2, no murmurs/rub/gallop  Resp: clear to auscultation bilaterally, normal effort Neuro: alert and oriented, no deficits Psych: mood normal, affect congruent, normal thought content and speech. No SI.  Assessment & Plan:  RHINITIS, CHRONIC Worsened symptoms. Would like to get this issue under control so that she can donate platelets/blood. Recommended restarting antihistamine/zyrtec, flonase, add  singulair. Follow up in 1 month to evaluate symptoms, if still not well controlled feel given her desire for blood donation would have her see an ENT to see if there is anything else she can be offered.  Depression, controlled Reports short mild worsening of symptoms which have now resolved. PHQ9 score 0. No SI. Continue effexor. Follow up in 6 months or as needed.

## 2015-08-22 ENCOUNTER — Encounter: Payer: Self-pay | Admitting: Family Medicine

## 2015-12-09 ENCOUNTER — Other Ambulatory Visit: Payer: Self-pay | Admitting: Family Medicine

## 2015-12-09 NOTE — Telephone Encounter (Signed)
Needs refills on "all of her meds"  Because she has switched pharmacie.  She is with the new [facility] rx [facility]acy  [phone]

## 2015-12-10 NOTE — Telephone Encounter (Signed)
LM for patient to call back.  Unable to find this pharmacy under our list.  Does she possibly have the name or the fax number for it?  I have also tried looking it up online with no luck.  Lotus Gover,CMA

## 2015-12-17 ENCOUNTER — Telehealth: Payer: Self-pay | Admitting: *Deleted

## 2015-12-17 DIAGNOSIS — J310 Chronic rhinitis: Secondary | ICD-10-CM

## 2015-12-17 MED ORDER — SIMVASTATIN 40 MG PO TABS: 40.0000 mg | ORAL_TABLET | Freq: Every day | ORAL | Status: DC

## 2015-12-17 MED ORDER — MONTELUKAST SODIUM 10 MG PO TABS: 10.0000 mg | ORAL_TABLET | Freq: Every day | ORAL | Status: DC

## 2015-12-17 MED ORDER — CETIRIZINE HCL 10 MG PO TABS: 10.0000 mg | ORAL_TABLET | Freq: Every day | ORAL | Status: DC

## 2015-12-17 MED ORDER — FLUTICASONE PROPIONATE 50 MCG/ACT NA SUSP: 2.0000 | Freq: Every day | NASAL | Status: DC

## 2015-12-17 MED ORDER — IPRATROPIUM BROMIDE 0.06 % NA SOLN: 2.0000 | Freq: Four times a day (QID) | NASAL | Status: DC

## 2015-12-17 MED ORDER — VENLAFAXINE HCL 100 MG PO TABS: 100.0000 mg | ORAL_TABLET | Freq: Two times a day (BID) | ORAL | Status: DC

## 2015-12-17 MED ORDER — LEVOTHYROXINE SODIUM 88 MCG PO TABS: 88.0000 ug | ORAL_TABLET | Freq: Every day | ORAL | Status: DC

## 2015-12-17 NOTE — Telephone Encounter (Signed)
Will forward to MD to send all medications we manage to this pharmacy. Jazmin Hartsell,CMA

## 2015-12-17 NOTE — Telephone Encounter (Signed)
Pt calls and wants to let MD know the Phone and Fax to her new mail order pharmacy. She needs all her meds faxed to this company.  Phone: [phone] Fax: [phone]  This has been changed to her preferred pharmacy in chart. Amber Allen, Salome Spotted

## 2015-12-17 NOTE — Telephone Encounter (Signed)
Rx sent in to new pharmacy.

## 2015-12-23 ENCOUNTER — Telehealth: Payer: Self-pay | Admitting: Family Medicine

## 2015-12-23 NOTE — Telephone Encounter (Signed)
Online pharmacy is calling because they need a couple of the patients medications at 90 day qty for her insurance to cover them Please call the pharmacy to discuss. jw

## 2015-12-24 ENCOUNTER — Other Ambulatory Visit: Payer: Self-pay | Admitting: Family Medicine

## 2015-12-24 DIAGNOSIS — J310 Chronic rhinitis: Secondary | ICD-10-CM

## 2015-12-24 NOTE — Telephone Encounter (Signed)
Please see most recent phone note for update. Jazmin Hartsell,CMA

## 2015-12-24 NOTE — Telephone Encounter (Signed)
Amber Allen from [facility] is calling for permission to fill ipratropium (ATROVENT) 0.06 % nasal spray and fluticasone (FLONASE) 50 MCG/ACT nasal spray for a 90 day supply due to mail-order. Please contact Amber Allen at the earliest convenience. Amber Allen, ASA

## 2015-12-25 MED ORDER — FLUTICASONE PROPIONATE 50 MCG/ACT NA SUSP: 2.0000 | Freq: Every day | NASAL | Status: DC

## 2015-12-25 MED ORDER — IPRATROPIUM BROMIDE 0.06 % NA SOLN: 2.0000 | Freq: Four times a day (QID) | NASAL | Status: DC

## 2016-01-17 ENCOUNTER — Ambulatory Visit (INDEPENDENT_AMBULATORY_CARE_PROVIDER_SITE_OTHER): Payer: PPO | Admitting: Family Medicine

## 2016-01-17 ENCOUNTER — Encounter: Payer: Self-pay | Admitting: Family Medicine

## 2016-01-17 VITALS — BP 167/91 | HR 90 | Temp 97.5°F | Wt 175.2 lb

## 2016-01-17 DIAGNOSIS — F329 Major depressive disorder, single episode, unspecified: Secondary | ICD-10-CM

## 2016-01-17 DIAGNOSIS — F1020 Alcohol dependence, uncomplicated: Secondary | ICD-10-CM

## 2016-01-17 DIAGNOSIS — IMO0001 Reserved for inherently not codable concepts without codable children: Secondary | ICD-10-CM

## 2016-01-17 DIAGNOSIS — I10 Essential (primary) hypertension: Secondary | ICD-10-CM

## 2016-01-17 DIAGNOSIS — R739 Hyperglycemia, unspecified: Secondary | ICD-10-CM

## 2016-01-17 DIAGNOSIS — B351 Tinea unguium: Secondary | ICD-10-CM

## 2016-01-17 DIAGNOSIS — R7309 Other abnormal glucose: Secondary | ICD-10-CM | POA: Diagnosis not present

## 2016-01-17 DIAGNOSIS — E039 Hypothyroidism, unspecified: Secondary | ICD-10-CM | POA: Diagnosis not present

## 2016-01-17 DIAGNOSIS — F32A Depression, unspecified: Secondary | ICD-10-CM

## 2016-01-17 LAB — COMPLETE METABOLIC PANEL WITH GFR
ALT: 16 U/L (ref 6–29)
AST: 18 U/L (ref 10–35)
Albumin: 3.9 g/dL (ref 3.6–5.1)
Alkaline Phosphatase: 82 U/L (ref 33–130)
BILIRUBIN TOTAL: 0.7 mg/dL (ref 0.2–1.2)
BUN: 12 mg/dL (ref 7–25)
CO2: 26 mmol/L (ref 20–31)
CREATININE: 0.82 mg/dL (ref 0.60–0.93)
Calcium: 9.2 mg/dL (ref 8.6–10.4)
Chloride: 103 mmol/L (ref 98–110)
GFR, EST AFRICAN AMERICAN: 82 mL/min (ref >=60)
GFR, Est Non African American: 71 mL/min (ref >=60)
GLUCOSE: 82 mg/dL (ref 65–99)
Potassium: 4.6 mmol/L (ref 3.5–5.3)
Sodium: 139 mmol/L (ref 135–146)
TOTAL PROTEIN: 6.6 g/dL (ref 6.1–8.1)

## 2016-01-17 LAB — TSH: TSH: 2.6 m[IU]/L

## 2016-01-17 LAB — POCT GLYCOSYLATED HEMOGLOBIN (HGB A1C): Hemoglobin A1C: 5.2

## 2016-01-17 MED ORDER — AMLODIPINE BESYLATE 5 MG PO TABS: 5.0000 mg | ORAL_TABLET | Freq: Every day | ORAL | Status: DC

## 2016-01-17 NOTE — Progress Notes (Addendum)
   Subjective:    Patient ID: Amber Allen, female    DOB: 1941-10-07, 75 y.o.   MRN: [id number]  HPI  CC: annual  # Hypothyroidism:  Stable, no concerns  Taking medicines, no missed doses ROS: no weight changes  # Depression  Stable currently  Did have a short time where she felt she worsened but has since recovered  # Left big toe  Nail discoloration  Not painful  Not itchy  Initially got worse but seems to be about the same now  Started after an injury  # Alcohol use  Chronic drinker, normally 3-4 glasses of wine a day  Both parents were alcoholics  Has stopped drinking only for lent, says it had been very difficult for her. ROS: no falls  Social Hx: never smoker. Drinks 3-4 glasses of wine a day  Review of Systems   ROS sheet reviewed and positive for dizziness, depression, "left big toe", all other systems negative  Past medical history, surgical, family, and social history reviewed and updated in the EMR as appropriate. Objective:  BP 167/91 mmHg  Pulse 90  Temp(Src) 97.5 F (36.4 C) (Oral)  Wt 175 lb 3.2 oz (79.47 kg) Vitals and nursing note reviewed  General: no apparent distress  HEENT: PERRL, EOMI.  CV: normal rate, regular rhythm, no murmurs, rubs or gallop.  Resp: clear to auscultation bilaterally, normal effort Extremities: there is trace edema bilaterally. oncychomycosis present left toenails #1, #3, 4, 5. Neuro: alert and oriented, gait is normal  Assessment & Plan:  Depression, controlled Stable, continue current medications.  Hypothyroidism Stable, will be due for TSH over the summer.  Hypertension Start amlodipine. Has demonstrated consistent history of elevated BP. Discussed alcohol use likely a huge contributor. Discussed reasons for controlling BP (risk of kidney damage, stroke, heart attack). Follow up with BP check within next month.  Alcoholism /alcohol abuse Continues to drink 4 drinks a day. Discussed again  ramifications and risks of this, including risk of falls. Patient did not demonstrate any willingness to quit. Follow up as needed.  Onychomycosis Discussed mostly cosmetic issue, difficult to treat, risks of therapy. Mutual decision to not undergo treatment.

## 2016-01-17 NOTE — Patient Instructions (Signed)
High blood pressure:  Start amlodipine 5mg  once a day. After taking this for several weeks, come back to clinic to get your blood pressure checked.   Lab work today: Engineer, production function, TSH, A1c to check for diabetes.

## 2016-01-18 NOTE — Assessment & Plan Note (Signed)
Stable, continue current medications.  

## 2016-01-18 NOTE — Assessment & Plan Note (Signed)
Start amlodipine. Has demonstrated consistent history of elevated BP. Discussed alcohol use likely a huge contributor. Discussed reasons for controlling BP (risk of kidney damage, stroke, heart attack). Follow up with BP check within next month.

## 2016-01-18 NOTE — Addendum Note (Signed)
Addended by: Leone Brand on: 01/18/2016 07:56 PM   Modules accepted: Miquel Dunn

## 2016-01-18 NOTE — Assessment & Plan Note (Signed)
Continues to drink 4 drinks a day. Discussed again ramifications and risks of this, including risk of falls. Patient did not demonstrate any willingness to quit. Follow up as needed.

## 2016-01-18 NOTE — Assessment & Plan Note (Signed)
Stable, will be due for TSH over the summer.

## 2016-01-20 ENCOUNTER — Telehealth: Payer: Self-pay | Admitting: Family Medicine

## 2016-01-20 ENCOUNTER — Encounter: Payer: Self-pay | Admitting: Family Medicine

## 2016-01-20 NOTE — Telephone Encounter (Signed)
LV stating I would send her results via letter and to call with any questions (lab results were normal).

## 2016-03-20 DIAGNOSIS — D1801 Hemangioma of skin and subcutaneous tissue: Secondary | ICD-10-CM | POA: Diagnosis not present

## 2016-03-20 DIAGNOSIS — Z85828 Personal history of other malignant neoplasm of skin: Secondary | ICD-10-CM | POA: Diagnosis not present

## 2016-03-20 DIAGNOSIS — L603 Nail dystrophy: Secondary | ICD-10-CM | POA: Diagnosis not present

## 2016-03-20 DIAGNOSIS — L814 Other melanin hyperpigmentation: Secondary | ICD-10-CM | POA: Diagnosis not present

## 2016-03-20 DIAGNOSIS — D225 Melanocytic nevi of trunk: Secondary | ICD-10-CM | POA: Diagnosis not present

## 2016-03-20 DIAGNOSIS — L918 Other hypertrophic disorders of the skin: Secondary | ICD-10-CM | POA: Diagnosis not present

## 2016-04-15 ENCOUNTER — Ambulatory Visit (HOSPITAL_COMMUNITY)
Admission: RE | Admit: 2016-04-15 | Discharge: 2016-04-15 | Disposition: A | Discharge status: Home / Self Care | Payer: PPO | Source: Ambulatory Visit | Attending: Family Medicine | Admitting: Family Medicine

## 2016-04-15 ENCOUNTER — Encounter: Payer: Self-pay | Admitting: Family Medicine

## 2016-04-15 ENCOUNTER — Ambulatory Visit (INDEPENDENT_AMBULATORY_CARE_PROVIDER_SITE_OTHER): Payer: PPO | Admitting: Family Medicine

## 2016-04-15 VITALS — BP 129/73 | HR 92 | Temp 98.5°F | Wt 175.0 lb

## 2016-04-15 DIAGNOSIS — I498 Other specified cardiac arrhythmias: Secondary | ICD-10-CM | POA: Diagnosis not present

## 2016-04-15 DIAGNOSIS — Z87898 Personal history of other specified conditions: Secondary | ICD-10-CM

## 2016-04-15 DIAGNOSIS — Z8679 Personal history of other diseases of the circulatory system: Secondary | ICD-10-CM

## 2016-04-15 DIAGNOSIS — K625 Hemorrhage of anus and rectum: Secondary | ICD-10-CM

## 2016-04-15 DIAGNOSIS — R9431 Abnormal electrocardiogram [ECG] [EKG]: Secondary | ICD-10-CM | POA: Insufficient documentation

## 2016-04-15 LAB — POCT HEMOGLOBIN: HEMOGLOBIN: 12.5 g/dL (ref 12.2–16.2)

## 2016-04-15 MED ORDER — METRONIDAZOLE 500 MG PO TABS: 500.0000 mg | ORAL_TABLET | Freq: Three times a day (TID) | ORAL | Status: DC

## 2016-04-15 MED ORDER — CIPROFLOXACIN HCL 500 MG PO TABS: 500.0000 mg | ORAL_TABLET | Freq: Two times a day (BID) | ORAL | Status: DC

## 2016-04-15 NOTE — Progress Notes (Signed)
Date of Visit: 04/15/2016   HPI:  Patient presents for a same day appointment to discuss possible diverticulitis.  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 In the afternoon felt gassy. Then started having bilateral lower abdominal pain. Had mucousy stool with dark red blood in it. No fever. Eating and drinking well.. No rectal or anal pain. Feels similar to prior episode of diverticulitis. Last bowel movement was yesterday and was normal. Prior hysterectomy.    Last colonoscopy reviewed: 12/27/08 Dr. Benson Norway Sessile polyp, medium hemorrhoids, extensive large sigmoid & descending colon diverticula Planned for repeat colonoscopy in 5 years  Seen also in late July of 2016 for rectal bleeding. Had urgent referral placed to Dr. Benson Norway. No records of that visit seem to be available. Patient reports that she is NOT going to have another colonoscopy as during her last one she aspirated and got pneumonia.  Of note she drinks approximately one bottle of wine daily. Reports she has gone periods of time without drinking and did not have problems with it. Gave it up for lent earlier this year.  ROS: See HPI  Amber Allen: history of hyperlipidemia, squamous cell carcinoma of hand, hypothyroidism, ETOH abuse, overweight, depression, hypertension   PHYSICAL EXAM: BP 129/73 mmHg  Pulse 92  Temp(Src) 98.5 F (36.9 C) (Oral)  Wt 175 lb (79.379 kg)  SpO2 99% Gen: NAD, pleasant, cooperative HEENT: normocephalic, atraumatic, moist mucous membranes  Heart: regular rate and rhythm, no murmur Lungs: clear to auscultation bilaterally, normal work of breathing  Abdomen: normoactive bowel sounds. Soft. Moderately tender to palpation in LLQ. No masses or organomegaly. No peritoneal signs Neuro: alert, grossly nonfocal, speech normal  ASSESSMENT/PLAN:  1. Diverticulitis - given history of known diverticulosis agree with patient that symptoms are very likely due to diverticulitis. She has had some rectal  bleeding but given that last bowel movement was yesterday, does not seem to have continued bleeding at present. Hemoglobin is stable. Abdominal exam benign, no signs of acute abdomen. Will treat with cipro & flagyl. EKG performed to confirm normal QTc with cipro (as in past had elevated QT). QT is normal. Discussed with patient that she will need to abstain from alcohol while on the flagyl. Discussed signs of etoh withdrawal and gave handout describing symptoms of etoh withdrawal. Patient knows to contact us or go to ED if she has these symptoms. If not improved in 2 days, she will follow up here to be seen. Specifically informed her that if bleeding persists she will need to see [place] again as well. Patient agreeable with this plan & appreciative.  FOLLOW UP: Follow up as needed if symptoms worsen or fail to improve.    [place]. Ardelia Mems, [place]

## 2016-04-15 NOTE — Patient Instructions (Addendum)
We are going to treat you for diverticulitis  Take ciprofloxacin 500mg  twice daily  Flagyl 500mg  three times daily  Take both for 7 days No alcohol while on the flagyl  If you are not feeling better in 2 days please return to be seen. If rectal bleeding persists we need to see you again.  Call us if you have any symptoms of alcohol withdrawal (see handout below)  Be well, Dr. Ardelia Mems   Diverticulitis Diverticulitis is inflammation or infection of small pouches in your colon that form when you have a condition called diverticulosis. The pouches in your colon are called diverticula. Your colon, or large intestine, is where water is absorbed and stool is formed. Complications of diverticulitis can include:  Bleeding.  Severe infection.  Severe pain.  Perforation of your colon.  Obstruction of your colon. CAUSES  Diverticulitis is caused by bacteria. Diverticulitis happens when stool becomes trapped in diverticula. This allows bacteria to grow in the diverticula, which can lead to inflammation and infection. RISK FACTORS People with diverticulosis are at risk for diverticulitis. Eating a diet that does not include enough fiber from fruits and vegetables may make diverticulitis more likely to develop. SYMPTOMS  Symptoms of diverticulitis may include:  Abdominal pain and tenderness. The pain is normally located on the left side of the abdomen, but may occur in other areas.  Fever and chills.  Bloating.  Cramping.  Nausea.  Vomiting.  Constipation.  Diarrhea.  Blood in your stool. DIAGNOSIS  Your health care provider will ask you about your medical history and do a physical exam. You may need to have tests done because many medical conditions can cause the same symptoms as diverticulitis. Tests may include:  Blood tests.  Urine tests.  Imaging tests of the abdomen, including X-rays and CT scans. When your condition is under control, your health care provider may  recommend that you have a colonoscopy. A colonoscopy can show how severe your diverticula are and whether something else is causing your symptoms. TREATMENT  Most cases of diverticulitis are mild and can be treated at home. Treatment may include:  Taking over-the-counter pain medicines.  Following a clear liquid diet.  Taking antibiotic medicines by mouth for 7-10 days. More severe cases may be treated at a hospital. Treatment may include:  Not eating or drinking.  Taking prescription pain medicine.  Receiving antibiotic medicines through an IV tube.  Receiving fluids and nutrition through an IV tube.  Surgery. HOME CARE INSTRUCTIONS   Follow your health care provider's instructions carefully.  Follow a full liquid diet or other diet as directed by your health care provider. After your symptoms improve, your health care provider may tell you to change your diet. He or she may recommend you eat a high-fiber diet. Fruits and vegetables are good sources of fiber. Fiber makes it easier to pass stool.  Take fiber supplements or probiotics as directed by your health care provider.  Only take medicines as directed by your health care provider.  Keep all your follow-up appointments. SEEK MEDICAL CARE IF:   Your pain does not improve.  You have a hard time eating food.  Your bowel movements do not return to normal. SEEK IMMEDIATE MEDICAL CARE IF:   Your pain becomes worse.  Your symptoms do not get better.  Your symptoms suddenly get worse.  You have a fever.  You have repeated vomiting.  You have bloody or black, tarry stools. MAKE SURE YOU:   Understand these instructions.  Will watch your condition.  Will get help right away if you are not doing well or get worse.   This information is not intended to replace advice given to you by your health care provider. Make sure you discuss any questions you have with your health care provider.   Document Released:  08/05/2005 Document Revised: 10/31/2013 Document Reviewed: 09/20/2013 Elsevier Interactive Patient Education 2016 [employer].   Alcohol Withdrawal Alcohol withdrawal is a group of symptoms that can develop when a person who drinks heavily and regularly stops drinking or drinks less. CAUSES Heavy and regular drinking can cause chemicals that send signals from the brain to the body (neurotransmitters) to deactivate. Alcohol withdrawal develops when deactivated neurotransmitters reactivate because a person stops drinking or drinks less. RISK FACTORS The more a person drinks and the longer he or she drinks, the greater the risk of alcohol withdrawal. Severe withdrawal is more likely to develop in someone who:  Had severe alcohol withdrawal in the past.  Had a seizure during a previous episode of alcohol withdrawal.  Is elderly.  Is pregnant.  Has been abusing drugs.  Has other medical problems, including:  Infection.  Heart, lung, or liver disease.  Seizures.  Mental health problems. SYMPTOMS Symptoms of this condition can be mild to moderate, or they can be severe. Mild to moderate symptoms may include:  Fatigue.  Nightmares.  Trouble sleeping.  Depression.  Anxiety.  Inability to think clearly.  Mood swings.  Irritability.  Loss of appetite.  Nausea or vomiting.  Clammy skin.  Extreme sweating.  Rapid heartbeat.  Shakiness.  Uncontrollable shaking (tremor). Severe symptoms may include:  Fever.  Seizures.  Severeconfusion.  Feeling or seeing things that are not there (hallucinations). Symptoms usually begin within eight hours after a person stops drinking or drinks less. They can last for weeks. DIAGNOSIS Alcohol withdrawal is diagnosed with a medical history and physical exam. Sometimes, urine and blood tests are also done. TREATMENT Treatment may involve:  Monitoring blood pressure, pulse, and breathing.  Getting fluids through an IV  tube.  Medicine to reduce anxiety.  Medicine to prevent or control seizures.  Multivitamins and B vitamins.  Having a health care provider check on you daily. If symptoms are moderate to severe or if there is a risk of severe withdrawal, treatment may be done at a hospital or treatment center. HOME CARE INSTRUCTIONS  Take medicines and vitamin supplements only as directed by your health care provider.  Do not drink alcohol.  Have someone stay with you or be available if you need help.  Drink enough fluid to keep your urine clear or pale yellow.  Consider joining a 12-step program or another alcohol support group. SEEK MEDICAL CARE IF:  Your symptoms get worse or do not go away.  You cannot keep food or water in your stomach.  You are struggling with not drinking alcohol.  You cannot stop drinking alcohol. SEEK IMMEDIATE MEDICAL CARE IF:   You have an irregular heartbeat.  You have chest pain.  You have trouble breathing.  You have symptoms of severe withdrawal, such as:  A fever.  Seizures.  Severe confusion.  Hallucinations.   This information is not intended to replace advice given to you by your health care provider. Make sure you discuss any questions you have with your health care provider.   Document Released: 08/05/2005 Document Revised: 11/16/2014 Document Reviewed: 08/14/2014 Elsevier Interactive Patient Education [employer].

## 2016-05-30 ENCOUNTER — Encounter (HOSPITAL_COMMUNITY): Payer: Self-pay | Admitting: *Deleted

## 2016-05-30 ENCOUNTER — Emergency Department (HOSPITAL_COMMUNITY)
Admission: EM | Admit: 2016-05-30 | Discharge: 2016-05-30 | Disposition: A | Discharge status: Home / Self Care | Payer: PPO | Attending: Emergency Medicine | Admitting: Emergency Medicine

## 2016-05-30 DIAGNOSIS — Z7982 Long term (current) use of aspirin: Secondary | ICD-10-CM | POA: Diagnosis not present

## 2016-05-30 DIAGNOSIS — Y999 Unspecified external cause status: Secondary | ICD-10-CM | POA: Insufficient documentation

## 2016-05-30 DIAGNOSIS — Y929 Unspecified place or not applicable: Secondary | ICD-10-CM | POA: Diagnosis not present

## 2016-05-30 DIAGNOSIS — S60410A Abrasion of right index finger, initial encounter: Secondary | ICD-10-CM | POA: Insufficient documentation

## 2016-05-30 DIAGNOSIS — S61300A Unspecified open wound of right index finger with damage to nail, initial encounter: Secondary | ICD-10-CM | POA: Insufficient documentation

## 2016-05-30 DIAGNOSIS — F329 Major depressive disorder, single episode, unspecified: Secondary | ICD-10-CM | POA: Diagnosis not present

## 2016-05-30 DIAGNOSIS — IMO0002 Reserved for concepts with insufficient information to code with codable children: Secondary | ICD-10-CM

## 2016-05-30 DIAGNOSIS — S6991XA Unspecified injury of right wrist, hand and finger(s), initial encounter: Secondary | ICD-10-CM | POA: Diagnosis not present

## 2016-05-30 DIAGNOSIS — Z79899 Other long term (current) drug therapy: Secondary | ICD-10-CM | POA: Insufficient documentation

## 2016-05-30 DIAGNOSIS — W268XXA Contact with other sharp object(s), not elsewhere classified, initial encounter: Secondary | ICD-10-CM | POA: Diagnosis not present

## 2016-05-30 DIAGNOSIS — Y9389 Activity, other specified: Secondary | ICD-10-CM | POA: Insufficient documentation

## 2016-05-30 DIAGNOSIS — T148XXA Other injury of unspecified body region, initial encounter: Secondary | ICD-10-CM

## 2016-05-30 MED ORDER — TETANUS-DIPHTH-ACELL PERTUSSIS 5-2.5-18.5 LF-MCG/0.5 IM SUSP
0.5000 mL | Freq: Once | INTRAMUSCULAR | Status: AC
  Administered 2016-05-30: 0.5 mL via INTRAMUSCULAR
  Filled 2016-05-30: qty 0.5

## 2016-05-30 NOTE — ED Notes (Signed)
PA at bedside.

## 2016-05-30 NOTE — Discharge Instructions (Signed)
°  Wound Check If you have a wound, it may take some time to heal. Eventually, a scar will form. The scar will also fade with time. It is important to take care of your wound while it is healing. This helps to protect your wound from infection.  HOW SHOULD I TAKE CARE OF MY WOUND AT HOME?  Some wounds are allowed to close on their own or are repaired at a later date. There are many different ways to close and cover a wound, including stitches (sutures), skin glue, and adhesive strips. Follow your health care provider's instructions about:  Wound care.  Bandage (dressing) changes and removal.  Wound closure removal.  Take medicines only as directed by your health care provider.  Keep all follow-up visits as directed by your health care provider. This is important.  Do not take baths, swim, or use a hot tub until your health care provider approves. You may shower as directed by your health care provider.  Keep your wound clean and dry. WHAT AFFECTS SCAR FORMATION? Scars affect each person differently. How your body scars depends on:  The location and size of your wound.  Traits that you inherited from your parents (genetic predisposition).  How you take care of your wound. Irritation and inflammation increase the amount of scar formation.  Sun exposure. This can darken a scar. WHEN SHOULD I CALL OR SEE [place] PROVIDER? Call or see your health care provider if:  You have redness, swelling, or pain at your wound site.  You have fluid, blood, or pus coming from your wound.  You have muscle aches, chills, or a general ill feeling.  You notice a bad smell coming from the wound.  Your wound separates after the sutures, staples, or skin adhesive strips have been removed.  You have persistent nausea or vomiting.  You have a fever.  You are dizzy. WHEN SHOULD I CALL 911 OR GO TO THE EMERGENCY ROOM? Call 911 or go to the emergency room if:  You faint.  You have  difficulty breathing.   This information is not intended to replace advice given to you by your health care provider. Make sure you discuss any questions you have with your health care provider.  You may wash wound with soap and water. Apply pressure to help stop bleeding. Otherwise, keep wound clean and dry. You may also apply neosporin for improved wound healing. Take ibuprofen as needed for pain. Return to the ED if you experience severe worsening of your symptoms, redness ro swelling around your wound, fevers or chills.

## 2016-05-30 NOTE — ED Notes (Signed)
Bed: [facility] Expected date:  Expected time:  Means of arrival:  Comments: 

## 2016-05-30 NOTE — ED Provider Notes (Signed)
CSN: [id number]     Arrival date & time 05/30/16  1455 History   First MD Initiated Contact with Patient 05/30/16 1553     Chief Complaint  Patient presents with  . finger lac      (Consider location/radiation/quality/duration/timing/severity/associated sxs/prior Treatment) HPI   Amber Allen is a 75 year old female with no significant past medical history who presents to the ED today with a laceration to her right first finger. Patient states that she was cutting onions with a mandolin earlier and axillae sliced the tip of her pointer finger. Patient washed it immediately with water and apply pressure but it would not stop bleeding. Patient states she also cut part of her nail. Last tetanus unknown. No other trauma or injury noted.  Past Medical History  Diagnosis Date  . Depression   . Diverticulosis   . Cholecystolithiasis   . De Quervain's tenosynovitis   . DIVERTICULITIS, HX OF 06/27/2008  . De Quervain's tenosynovitis, left 09/09/2012  . Unspecified vitamin D deficiency 12/12/2008   Past Surgical History  Procedure Laterality Date  . Appendectomy    . Breast biopsy      no malignancy  . Tonsillectomy    . Vaginal hysterectomy      including cervix   Family History  Problem Relation Age of Onset  . Hepatitis Brother     hep C  . Heart failure Father     MI-65  . Hypertension Father   . Heart failure Mother    Social History  Substance Use Topics  . Smoking status: Never Smoker   . Smokeless tobacco: Never Used  . Alcohol Use: 7.5 oz/week    15 drink(s) per week   OB History    No data available     Review of Systems  All other systems reviewed and are negative.     Allergies  Review of patient's allergies indicates no known allergies.  Home Medications   Prior to Admission medications   Medication Sig Start Date End Date Taking? Authorizing Provider  amLODipine (NORVASC) 5 MG tablet Take 1 tablet (5 mg total) by mouth daily. 01/17/16  Yes Leone Brand, MD  aspirin EC 81 MG tablet Take 81 mg by mouth daily.   Yes Historical Provider, MD  hydrocortisone 1 % lotion Apply 1 application topically 2 (two) times daily as needed for itching.   Yes Historical Provider, MD  levothyroxine (SYNTHROID, LEVOTHROID) 88 MCG tablet Take 1 tablet (88 mcg total) by mouth daily. 12/17/15  Yes Leone Brand, MD  Multiple Vitamin (MULTIVITAMIN WITH MINERALS) TABS tablet Take 1 tablet by mouth daily.   Yes Historical Provider, MD  Multiple Vitamins-Minerals (PRESERVISION AREDS 2 PO) Take 1 capsule by mouth 2 (two) times daily.    Yes Historical Provider, MD  simvastatin (ZOCOR) 40 MG tablet Take 40 mg by mouth at bedtime.   Yes Historical Provider, MD  venlafaxine (EFFEXOR) 100 MG tablet Take 1 tablet (100 mg total) by mouth 2 (two) times daily. 12/17/15  Yes Leone Brand, MD  ciprofloxacin (CIPRO) 500 MG tablet Take 1 tablet (500 mg total) by mouth every 12 (twelve) hours. Patient not taking: Reported on 05/30/2016 04/15/16   Leeanne Rio, MD  metroNIDAZOLE (FLAGYL) 500 MG tablet Take 1 tablet (500 mg total) by mouth every 8 (eight) hours. For 7 days. Do not drink alcohol while taking this medication. Patient not taking: Reported on 05/30/2016 04/15/16   Leeanne Rio, MD   BP 136/90  mmHg  Pulse 100  Temp(Src) 97.6 F (36.4 C) (Oral)  Resp 18  SpO2 98% Physical Exam  Constitutional: She is oriented to person, place, and time. She appears well-developed and well-nourished. No distress.  HENT:  Head: Normocephalic and atraumatic.  Eyes: Conjunctivae are normal. Right eye exhibits no discharge. Left eye exhibits no discharge. No scleral icterus.  Cardiovascular: Normal rate.   Pulmonary/Chest: Effort normal.  Neurological: She is alert and oriented to person, place, and time. Coordination normal.  Normal sensation.  Skin: Skin is warm and dry. No rash noted. She is not diaphoretic. No erythema. No pallor.  Superficial abrasion to distal tip of R  index finger. Partial distal nail avulsion. Intact nail fold. Good cap refill.  Psychiatric: She has a normal mood and affect. Her behavior is normal.  Nursing note and vitals reviewed.   ED Course  Procedures (including critical care time) Labs Review Labs Reviewed - No data to display  Imaging Review No results found. I have personally reviewed and evaluated these images and lab results as part of my medical decision-making.   EKG Interpretation None      MDM   Final diagnoses:  Abrasion  Nail avulsion   Pt presents to the ED with superficial abrasion and partial nail avulsion of right index finger. Wound cleaned and dressed appropriately. Patient is neurovascularly intact. Sutures are not indicated for this wound, no deep laceration. Nail fold is intact. Bleeding controlled, pressure applied. TdaP updated in the ED. Follow-up with PCP for wound recheck. No antibiotic indications at this time.    Dondra Spry [place], PA-C 05/30/16 [place], MD 05/30/16 [phone]

## 2016-05-30 NOTE — ED Notes (Signed)
Pt has wrapped laceration on right pointer finger following cutting her hand with a mandolin. Pt reports 0/10 pain. Bleeding is controlled

## 2016-06-01 ENCOUNTER — Encounter: Payer: Self-pay | Admitting: Internal Medicine

## 2016-06-01 ENCOUNTER — Ambulatory Visit (INDEPENDENT_AMBULATORY_CARE_PROVIDER_SITE_OTHER): Payer: PPO | Admitting: Internal Medicine

## 2016-06-01 DIAGNOSIS — S60419A Abrasion of unspecified finger, initial encounter: Secondary | ICD-10-CM | POA: Insufficient documentation

## 2016-06-01 DIAGNOSIS — S60419D Abrasion of unspecified finger, subsequent encounter: Secondary | ICD-10-CM | POA: Diagnosis not present

## 2016-06-01 NOTE — Progress Notes (Signed)
   Subjective:    Brookes Staloch - 75 y.o. female MRN [id number]  Date of birth: 02-10-1941  HPI  Lakera Josselyn is here for ED follow-up for superficial abrasion and partial nail avulsion of right index finger.    Abrasion and Partial Nail Avulsion: Of right index finger. Occurred using mandalay to cut onions. Seen in ED on 7/22 where wound was cleaned and dressed. Sutures were not indicated. Tdap was updated in ED. Bleeding was controlled with pressure. No antibiotics were given. Today, patient reports there is no pain. Might still have a little bit of bleeding. There was some bleeding yesterday when she changed her bandage. Changed her dressing several times yesterday because she was busy and it would get dirty etc. No drainage from the cut. Afebrile at home and feeling well. Denies nausea/vomiting. Applying Neosporin to area with dressing changes.     -  reports that she has never smoked. She has never used smokeless tobacco. - Review of Systems: Per HPI. - Past Medical History: Patient Active Problem List   Diagnosis Date Noted  . Abrasion of finger 06/01/2016  . Hematochezia 06/06/2015  . Hypertension 02/18/2013  . Alcoholism /alcohol abuse ([place]) 02/18/2013  . 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 Overweight(278.02) 03/11/2009  . RHINITIS, CHRONIC 03/09/2007  . Hypothyroidism 01/06/2007  . HYPERCHOLESTEROLEMIA 01/06/2007  . Depression, controlled 01/06/2007   - Medications: reviewed and updated    Objective:   Physical Exam BP 124/82   Pulse 93   Temp 98.6 F (37 C) (Oral)   Ht 5\' 6"  (1.676 m)   Wt 174 lb 9.6 oz (79.2 kg)   BMI 28.18 kg/m  Gen: NAD, alert, cooperative with exam, well-appearing CV: 2+ radial pulses. Good cap refill.  Skin: Superficial abrasion to distal tip of right middle finger with dried blood and pinpoint amount of bleeding. No drainage from the area. Partial distal nail avulsion with intact nail fold. No surrounding or spreading erythema. No  significant edema in area of injury.  Neuro: Sensation to area of injury grossly intact.      Assessment & Plan:   Abrasion of finger Appears to be healing well without any signs of local infection.  -patient to continue to keep area clean and bandaged  -reviewed signs of infection     Phill Myron, D.O. 06/01/2016, 2:45 PM PGY-2, [place]

## 2016-06-01 NOTE — Patient Instructions (Signed)
Continue to keep the area clean and wrapped like you are doing. It looks like it is healing well.   Monitor for signs of infection which would include pus draining for the wound, redness extending up the finger, significant swelling to the area, fevers and nausea/vomiting.   Please call the clinic at [phone] if you have any questions.   Take Care,   Dr. Juleen China

## 2016-06-01 NOTE — Assessment & Plan Note (Signed)
Appears to be healing well without any signs of local infection.  -patient to continue to keep area clean and bandaged  -reviewed signs of infection

## 2016-07-29 ENCOUNTER — Telehealth: Payer: Self-pay | Admitting: Internal Medicine

## 2016-07-29 NOTE — Telephone Encounter (Signed)
Pt called regarding bleeding from the rectum.  Call was transferred to [place]

## 2016-07-29 NOTE — Telephone Encounter (Signed)
Patient called stating she was bleeding from her rectum.  Patient reported that when she went to wipe, she noticed bright red blood on the tissue.  She noticed this twice.  Patient denies any abdominal pain, fever, n/v or severe bleeding.  The blood was noticed on the tissue.  Patient denies history of hemorrhoids.  Advised patient she should be seen by a provider for further evaluation.  Offered appointment for tomorrow morning with her PCP, but declined stating she has a plummer coming to her house and unsure what time they will arrive.  Advised patient if she is very concerned then she could go to urgent care today or ED.  Patient stated because it was a small amount of blood, she will call clinic in morning if she noticed it happening again.  Advised patient to go to ED if she noticed bright red stools or any bleeding again, fever, n/v or abdominal pain. Will forward to PCP.  Derl Barrow, RN

## 2016-12-17 ENCOUNTER — Other Ambulatory Visit: Payer: Self-pay | Admitting: *Deleted

## 2016-12-17 MED ORDER — LEVOTHYROXINE SODIUM 88 MCG PO TABS: 88.0000 ug | ORAL_TABLET | Freq: Every day | ORAL | 0 refills | Status: DC

## 2016-12-17 NOTE — Telephone Encounter (Signed)
Will refill prescription. Please call patient and ask her to make a follow up appointment in clinic with PCP

## 2016-12-18 NOTE — Telephone Encounter (Signed)
Patient made aware and appt made for 01-20-17. Mayana Irigoyen,CMA

## 2017-01-22 ENCOUNTER — Encounter: Payer: Self-pay | Admitting: Internal Medicine

## 2017-01-22 ENCOUNTER — Telehealth: Payer: Self-pay | Admitting: Internal Medicine

## 2017-01-22 ENCOUNTER — Ambulatory Visit (INDEPENDENT_AMBULATORY_CARE_PROVIDER_SITE_OTHER): Payer: PPO | Admitting: Internal Medicine

## 2017-01-22 VITALS — BP 124/60 | HR 97 | Temp 98.2°F | Ht 66.0 in | Wt 175.8 lb

## 2017-01-22 DIAGNOSIS — Z1211 Encounter for screening for malignant neoplasm of colon: Secondary | ICD-10-CM | POA: Diagnosis not present

## 2017-01-22 DIAGNOSIS — Z8739 Personal history of other diseases of the musculoskeletal system and connective tissue: Secondary | ICD-10-CM | POA: Diagnosis not present

## 2017-01-22 DIAGNOSIS — E785 Hyperlipidemia, unspecified: Secondary | ICD-10-CM | POA: Diagnosis not present

## 2017-01-22 DIAGNOSIS — I10 Essential (primary) hypertension: Secondary | ICD-10-CM | POA: Diagnosis not present

## 2017-01-22 DIAGNOSIS — Z0000 Encounter for general adult medical examination without abnormal findings: Secondary | ICD-10-CM | POA: Diagnosis not present

## 2017-01-22 DIAGNOSIS — E039 Hypothyroidism, unspecified: Secondary | ICD-10-CM

## 2017-01-22 DIAGNOSIS — K5730 Diverticulosis of large intestine without perforation or abscess without bleeding: Secondary | ICD-10-CM | POA: Insufficient documentation

## 2017-01-22 LAB — COMPLETE METABOLIC PANEL WITH GFR
ALT: 19 U/L (ref 6–29)
AST: 24 U/L (ref 10–35)
Albumin: 4.2 g/dL (ref 3.6–5.1)
Alkaline Phosphatase: 98 U/L (ref 33–130)
BUN: 14 mg/dL (ref 7–25)
CALCIUM: 9 mg/dL (ref 8.6–10.4)
CHLORIDE: 107 mmol/L (ref 98–110)
CO2: 25 mmol/L (ref 20–31)
Creat: 0.75 mg/dL (ref 0.60–0.93)
GFR, Est African American: >89 mL/min (ref >=60)
GFR, Est Non African American: 78 mL/min (ref >=60)
Glucose, Bld: 85 mg/dL (ref 65–99)
Potassium: 4.3 mmol/L (ref 3.5–5.3)
Sodium: 140 mmol/L (ref 135–146)
TOTAL PROTEIN: 6.8 g/dL (ref 6.1–8.1)
Total Bilirubin: 0.6 mg/dL (ref 0.2–1.2)

## 2017-01-22 LAB — LIPID PANEL
CHOLESTEROL: 190 mg/dL (ref <200)
HDL: 69 mg/dL (ref >50)
LDL CALC: 98 mg/dL (ref <100)
Total CHOL/HDL Ratio: 2.8 Ratio (ref <5.0)
Triglycerides: 113 mg/dL (ref <150)
VLDL: 23 mg/dL (ref <30)

## 2017-01-22 LAB — TSH: TSH: 5.12 mIU/L — ABNORMAL HIGH

## 2017-01-22 NOTE — Progress Notes (Signed)
   [facility] Phone: [phone]   Date of Visit: 01/22/2017   HPI:  Patient presents today for a well woman exam.   Concerns today: no concerns today  Periods: no. 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  Drugs: denies Advance directives: already has done Mood: no Dentist: no dentist   CAGE: score 0  Review of Systems  Constitutional: Negative for chills, fever and weight loss.  HENT: Negative for congestion, ear pain and sore throat.   Eyes: Negative for blurred vision and double vision.  Respiratory: Negative for cough and shortness of breath.   Cardiovascular: Negative for chest pain, palpitations and leg swelling.  Gastrointestinal: Negative for abdominal pain, constipation and diarrhea.  Genitourinary: Negative for dysuria, frequency and urgency.  Musculoskeletal: Negative for joint pain and myalgias.  Skin: Negative for rash.  Neurological: Negative for dizziness, tingling and headaches.  Endo/Heme/Allergies: Negative for polydipsia. Does not bruise/bleed easily.  Psychiatric/Behavioral: Negative for depression and suicidal ideas.     ROS: See HPI  [place]:  Cancers in family: none FH: Mother- heart failure, alcoholism. Father-heart failure, HTN, Alcoholism. Brother- hepatitis C SH: as noted above  PMH: HTN Hypercholesterolemia Alcoholism  Depression Diverticulosis with hx of rectal bleeding Chronic Rhinitis Hypothyroidism Hx Squamous Cell Carcinoma of the Hand Osteopenia   PHYSICAL EXAM: BP 124/60   Pulse 97   Temp 98.2 F (36.8 C) (Oral)    Ht 5\' 6"  (1.676 m)   Wt 175 lb 12.8 oz (79.7 kg)   SpO2 97%   BMI 28.37 kg/m  Gen: NAD, pleasant, cooperative HEENT: NCAT, PERRL, no palpable thyromegaly or anterior cervical lymphadenopathy Heart: RRR, no murmurs Lungs: CTAB, NWOB Abdomen: soft, nontender to palpation Neuro: grossly nonfocal, speech normal   ASSESSMENT/PLAN:  # Health maintenance:  -STD screening: declined -pap smear: hysteretomy including cervix for prolapsed uterus -mammogram: last completed in 05/2010 per chart review which was normal. Patient declined.  -lipid screening: ordered -DEXA: ordered -immunizations: uptodate -advance directives: discussed, patient reports she completed  -handout given on health maintenance topics -Colonoscopy: last in 12/27/08. Sessile polyp, medium hemorrhoids, extensive large sigmoid & descending colon diverticula. Recommended follow up in 5 years. Patient initially declined but at the end willing to follow up. Made referral to GI.   Hypothyroidism:  - repeat TSH    Smiley Houseman, MD PGY [place]

## 2017-01-22 NOTE — Patient Instructions (Signed)
Thank you for coming in! We will get lab work today. I will call you with the results I made a referral to GI for colonoscopy  I ordered a bone density scan as well.

## 2017-01-27 ENCOUNTER — Telehealth: Payer: Self-pay | Admitting: Internal Medicine

## 2017-01-27 MED ORDER — LEVOTHYROXINE SODIUM 100 MCG PO TABS: 100.0000 ug | ORAL_TABLET | Freq: Every day | ORAL | 1 refills | Status: DC

## 2017-01-27 NOTE — Telephone Encounter (Signed)
Called patient to discuss lab results. TSH slightly elevated. Patient does take medication in the morning without any food and waits to take other medications and waits 1 hr before eating. Therefore, will increase dose from 88 to 116mcg daily. Follow up in 6 weeks after starting new dose. Questions answered

## 2017-01-28 ENCOUNTER — Other Ambulatory Visit: Payer: Self-pay | Admitting: *Deleted

## 2017-01-28 MED ORDER — VENLAFAXINE HCL 100 MG PO TABS: 100.0000 mg | ORAL_TABLET | Freq: Two times a day (BID) | ORAL | 3 refills | Status: DC

## 2017-01-28 NOTE — Telephone Encounter (Signed)
Opened in error

## 2017-01-29 ENCOUNTER — Encounter: Payer: Self-pay | Admitting: Internal Medicine

## 2017-01-29 DIAGNOSIS — K635 Polyp of colon: Secondary | ICD-10-CM | POA: Insufficient documentation

## 2017-02-01 ENCOUNTER — Telehealth: Payer: Self-pay | Admitting: Internal Medicine

## 2017-02-01 NOTE — Telephone Encounter (Signed)
[facility]: needs verification of strength and 90 day supply levothyroxine.  Please call

## 2017-02-02 MED ORDER — LEVOTHYROXINE SODIUM 100 MCG PO TABS: 100.0000 ug | ORAL_TABLET | Freq: Every day | ORAL | 0 refills | Status: DC

## 2017-02-02 NOTE — Telephone Encounter (Signed)
Ernie Hew from mail order pharmacy to verify dose and change to 90 day supply

## 2017-02-11 ENCOUNTER — Encounter: Payer: Self-pay | Admitting: Internal Medicine

## 2017-03-02 ENCOUNTER — Ambulatory Visit (INDEPENDENT_AMBULATORY_CARE_PROVIDER_SITE_OTHER): Payer: PPO | Admitting: *Deleted

## 2017-03-02 ENCOUNTER — Encounter: Payer: Self-pay | Admitting: *Deleted

## 2017-03-02 VITALS — BP 120/80 | HR 74 | Temp 98.6°F | Ht 66.0 in | Wt 182.3 lb

## 2017-03-02 DIAGNOSIS — Z0000 Encounter for general adult medical examination without abnormal findings: Secondary | ICD-10-CM

## 2017-03-02 NOTE — Progress Notes (Signed)
Subjective:   Amber Allen is a 76 y.o. female who presents for an Initial Medicare Annual Wellness Visit.   Cardiac Risk Factors include: advanced age (>44men, >73 women);dyslipidemia;hypertension;sedentary lifestyle     Objective:    Today's Vitals   03/02/17 1115  BP: 120/80  Pulse: 74  Temp: 98.6 F (37 C)  TempSrc: Oral  SpO2: 98%  Weight: 182 lb 4.8 oz (82.7 kg)  Height: 5\' 6"  (1.676 m)  PainSc: 0-Allen pain   Body mass index is 29.42 kg/m.   Current Medications (verified) Outpatient Encounter Prescriptions as of 03/02/2017  Medication Sig  . amLODipine (NORVASC) 5 MG tablet Take 1 tablet (5 mg total) by mouth daily.  Marland Kitchen aspirin EC 81 MG tablet Take 81 mg by mouth daily.  Marland Kitchen levothyroxine (SYNTHROID, LEVOTHROID) 100 MCG tablet Take 1 tablet (100 mcg total) by mouth daily.  . Multiple Vitamin (MULTIVITAMIN WITH MINERALS) TABS tablet Take 1 tablet by mouth daily.  . Multiple Vitamins-Minerals (PRESERVISION AREDS 2 PO) Take 1 capsule by mouth 2 (two) times daily.   . simvastatin (ZOCOR) 40 MG tablet Take 40 mg by mouth at bedtime.  Marland Kitchen venlafaxine (EFFEXOR) 100 MG tablet Take 1 tablet (100 mg total) by mouth 2 (two) times daily.   Allen facility-administered encounter medications on file as of 03/02/2017.     Allergies (verified) Patient has Allen known allergies.   History: Past Medical History:  Diagnosis Date  . Cancer ([place]) 2015   Skin  . Cholecystolithiasis   . De Quervain's tenosynovitis   . De Quervain's tenosynovitis, left 09/09/2012  . Depression   . DIVERTICULITIS, HX OF 06/27/2008  . Diverticulosis   . Hyperlipidemia   . Hypertension   . Thyroid disease   . Unspecified vitamin D deficiency 12/12/2008   Past Surgical History:  Procedure Laterality Date  . APPENDECTOMY  1952  . BREAST BIOPSY  1994   Allen malignancy  . TONSILLECTOMY    . VAGINAL HYSTERECTOMY  1974   including cervix   Family History  Problem Relation Age of Onset  . Hepatitis Brother     hep C  . Hearing loss Brother   . Arthritis Brother   . Heart failure Father     MI-65  . Hypertension Father   . Alcoholism Father   . Hyperlipidemia Father   . Heart disease Father   . Alcohol abuse Father   . Heart failure Mother     56  . Alcoholism Mother   . Heart disease Mother   . Alcohol abuse Mother    Social History   Occupational History  . Not on file.   Social History Main Topics  . Smoking status: Never Smoker  . Smokeless tobacco: Never Used  . Alcohol use 12.6 oz/week    21 Glasses of wine per week  . Drug use: Allen  . Sexual activity: Allen    Tobacco Counseling Counseling given: Yes Patient has never smoked and has Allen plans to start.   Activities of Daily Living In your present state of health, do you have any difficulty performing the following activities: 03/02/2017  Hearing? N  Vision? N  Difficulty concentrating or making decisions? N  Walking or climbing stairs? N  Dressing or bathing? N  Doing errands, shopping? N  Preparing Food and eating ? N  Using the Toilet? N  In the past six months, have you accidently leaked urine? N  Do you have problems with loss of bowel control? N  Managing your Medications? N  Managing your Finances? N  Housekeeping or managing your Housekeeping? N  Some recent data might be hidden   Home Safety:  My home has a working smoke alarm:  Yes X 2           My home throw rugs have been fastened down to the floor or removed:  Removed I have non-slip mats in the bathtub and shower:  Allen, discussed obtaining non slip decals for tub         All my home's stairs have railings or bannisters: One level home with 3 outside steps with handrails           My home's floors, stairs and hallways are free from clutter, wires and cords:  Yes     I wear seatbelts consistently:  Yes   Immunizations and Health Maintenance Immunization History  Administered Date(s) Administered  . Influenza Split 09/09/2012  . Pneumococcal  Conjugate-13 01/18/2015  . Pneumococcal Polysaccharide-23 06/29/2007  . Td 01/07/2005  . Tdap 01/18/2015, 05/30/2016  . Zoster 12/07/2008   There are Allen preventive care reminders to display for this patient. Patient Declines annual flu shots and mammograms  Patient Care Team: Amber Houseman, MD as PCP - General (Family Medicine) Amber Pearson, MD as Consulting Physician (Ophthalmology) Dr[facility] Fontaine Allen at [facility] any recent [place] you may have received from other than [facility] providers in the past year (date may be approximate).     Assessment:   This is a routine wellness examination for Amber Allen.   Hearing/Vision screen  Hearing Screening   Method: Audiometry   125Hz  250Hz  500Hz  1000Hz  2000Hz  3000Hz  4000Hz  6000Hz  8000Hz   Right ear:   40 Fail Fail  40    Left ear:   Fail 40 Fail  Fail    Patient has not noted difficulties with hearing.  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 Exercise 5x per week (15 min twice daily) (pt-stated)    . Weight (lb) < 162 lb (73.5 kg)     Walking for exercise   Depression Screen PHQ 2/9 Scores 03/02/2017 01/22/2017 06/01/2016 04/15/2016 08/19/2015 06/06/2015 01/18/2015  PHQ - 2 Score 0 0 0 0 0 0 0  PHQ- 9 Score - - - - 0 - -    Fall Risk Fall Risk  03/02/2017 01/22/2017 06/01/2016 08/19/2015 06/06/2015  Falls in the past year? Allen Allen Allen Allen Allen   TUG Test:  Done in 11 seconds. Wearing open-toed sandals. Discussed this may increase fall risk. Patient agrees, states she had a previous fall while wearing these shoes in the past.  Cognitive Function: Mini-Cog  Passed with score 5/5   Screening Tests Health Maintenance  Topic Date Due  . INFLUENZA VACCINE  06/09/2017  . TETANUS/TDAP  05/30/2026  .  DEXA SCAN  Completed  . PNA vac Low Risk Adult  Completed      Plan:     During the course of the visit, Amber Allen was educated and counseled about the following appropriate screening and preventive services:   Vaccines to include Pneumoccal, Influenza, Td, Zostavax/Shingrix  Cardiovascular disease screening  Colorectal cancer screening  Bone density screening  Diabetes screening  Mammography/PAP  Nutrition counseling   Patient Instructions (the written plan) were given  to the patient.    Velora Heckler, RN   03/02/2017

## 2017-03-02 NOTE — Patient Instructions (Signed)
Fall Prevention in the Home Falls can cause injuries. They can happen to people of all ages. There are many things you can do to make your home safe and to help prevent falls. What can I do on the outside of my home?  Regularly fix the edges of walkways and driveways and fix any cracks.  Remove anything that might make you trip as you walk through a door, such as a raised step or threshold.  Trim any bushes or trees on the path to your home.  Use bright outdoor lighting.  Clear any walking paths of anything that might make someone trip, such as rocks or tools.  Regularly check to see if handrails are loose or broken. Make sure that both sides of any steps have handrails.  Any raised decks and porches should have guardrails on the edges.  Have any leaves, snow, or ice cleared regularly.  Use sand or salt on walking paths during winter.  Clean up any spills in your garage right away. This includes oil or grease spills. What can I do in the bathroom?  Use night lights.  Install grab bars by the toilet and in the tub and shower. Do not use towel bars as grab bars.  Use non-skid mats or decals in the tub or shower.  If you need to sit down in the shower, use a plastic, non-slip stool.  Keep the floor dry. Clean up any water that spills on the floor as soon as it happens.  Remove soap buildup in the tub or shower regularly.  Attach bath mats securely with double-sided non-slip rug tape.  Do not have throw rugs and other things on the floor that can make you trip. What can I do in the bedroom?  Use night lights.  Make sure that you have a light by your bed that is easy to reach.  Do not use any sheets or blankets that are too big for your bed. They should not hang down onto the floor.  Have a firm chair that has side arms. You can use this for support while you get dressed.  Do not have throw rugs and other things on the floor that can make you trip. What can I do in  the kitchen?  Clean up any spills right away.  Avoid walking on wet floors.  Keep items that you use a lot in easy-to-reach places.  If you need to reach something above you, use a strong step stool that has a grab bar.  Keep electrical cords out of the way.  Do not use floor polish or wax that makes floors slippery. If you must use wax, use non-skid floor wax.  Do not have throw rugs and other things on the floor that can make you trip. What can I do with my stairs?  Do not leave any items on the stairs.  Make sure that there are handrails on both sides of the stairs and use them. Fix handrails that are broken or loose. Make sure that handrails are as long as the stairways.  Check any carpeting to make sure that it is firmly attached to the stairs. Fix any carpet that is loose or worn.  Avoid having throw rugs at the top or bottom of the stairs. If you do have throw rugs, attach them to the floor with carpet tape.  Make sure that you have a light switch at the top of the stairs and the bottom of the stairs. If you  do not have them, ask someone to add them for you. What else can I do to help prevent falls?  Wear shoes that:  Do not have high heels.  Have rubber bottoms.  Are comfortable and fit you well.  Are closed at the toe. Do not wear sandals.  If you use a stepladder:  Make sure that it is fully opened. Do not climb a closed stepladder.  Make sure that both sides of the stepladder are locked into place.  Ask someone to hold it for you, if possible.  Clearly mark and make sure that you can see:  Any grab bars or handrails.  First and last steps.  Where the edge of each step is.  Use tools that help you move around (mobility aids) if they are needed. These include:  Canes.  Walkers.  Scooters.  Crutches.  Turn on the lights when you go into a dark area. Replace any light bulbs as soon as they burn out.  Set up your furniture so you have a clear  path. Avoid moving your furniture around.  If any of your floors are uneven, fix them.  If there are any pets around you, be aware of where they are.  Review your medicines with your doctor. Some medicines can make you feel dizzy. This can increase your chance of falling. Ask your doctor what other things that you can do to help prevent falls. This information is not intended to replace advice given to you by your health care provider. Make sure you discuss any questions you have with your health care provider. Document Released: 08/22/2009 Document Revised: 04/02/2016 Document Reviewed: 11/30/2014 Elsevier Interactive Patient Education  2017 [place] Maintenance, Female Adopting a healthy lifestyle and getting preventive care can go a long way to promote health and wellness. Talk with your health care provider about what schedule of regular examinations is right for you. This is a good chance for you to check in with your provider about disease prevention and staying healthy. In between checkups, there are plenty of things you can do on your own. Experts have done a lot of research about which lifestyle changes and preventive measures are most likely to keep you healthy. Ask your health care provider for more information. Weight and diet Eat a healthy diet  Be sure to include plenty of vegetables, fruits, low-fat dairy products, and lean protein.  Do not eat a lot of foods high in solid fats, added sugars, or salt.  Get regular exercise. This is one of the most important things you can do for your health.  Most adults should exercise for at least 150 minutes each week. The exercise should increase your heart rate and make you sweat (moderate-intensity exercise).  Most adults should also do strengthening exercises at least twice a week. This is in addition to the moderate-intensity exercise. Maintain a healthy weight  Body mass index (BMI) is a measurement that can be used  to identify possible weight problems. It estimates body fat based on height and weight. Your health care provider can help determine your BMI and help you achieve or maintain a healthy weight.  For females 45 years of age and older:  A BMI below 18.5 is considered underweight.  A BMI of 18.5 to 24.9 is normal.  A BMI of 25 to 29.9 is considered overweight.  A BMI of 30 and above is considered obese. Watch levels of cholesterol and blood lipids  You should start having  your blood tested for lipids and cholesterol at 76 years of age, then have this test every 5 years.  You may need to have your cholesterol levels checked more often if:  Your lipid or cholesterol levels are high.  You are older than 76 years of age.  You are at high risk for heart disease. Cancer screening Lung Cancer  Lung cancer screening is recommended for adults 55-80 years old who are at high risk for lung cancer because of a history of smoking.  A yearly low-dose CT scan of the lungs is recommended for people who:  Currently smoke.  Have quit within the past 15 years.  Have at least a 30-pack-year history of smoking. A pack year is smoking an average of one pack of cigarettes a day for 1 year.  Yearly screening should continue until it has been 15 years since you quit.  Yearly screening should stop if you develop a health problem that would prevent you from having lung cancer treatment. Breast Cancer  Practice breast self-awareness. This means understanding how your breasts normally appear and feel.  It also means doing regular breast self-exams. Let your health care provider know about any changes, no matter how small.  If you are in your 20s or 30s, you should have a clinical breast exam (CBE) by a health care provider every 1-3 years as part of a regular health exam.  If you are 40 or older, have a CBE every year. Also consider having a breast X-ray (mammogram) every year.  If you have a family  history of breast cancer, talk to your health care provider about genetic screening.  If you are at high risk for breast cancer, talk to your health care provider about having an MRI and a mammogram every year.  Breast cancer gene (BRCA) assessment is recommended for women who have family members with BRCA-related cancers. BRCA-related cancers include:  Breast.  Ovarian.  Tubal.  Peritoneal cancers.  Results of the assessment will determine the need for genetic counseling and BRCA1 and BRCA2 testing. Cervical Cancer  Your health care provider may recommend that you be screened regularly for cancer of the pelvic organs (ovaries, uterus, and vagina). This screening involves a pelvic examination, including checking for microscopic changes to the surface of your cervix (Pap test). You may be encouraged to have this screening done every 3 years, beginning at age 21.  For women ages 30-65, health care providers may recommend pelvic exams and Pap testing every 3 years, or they may recommend the Pap and pelvic exam, combined with testing for human papilloma virus (HPV), every 5 years. Some types of HPV increase your risk of cervical cancer. Testing for HPV may also be done on women of any age with unclear Pap test results.  Other health care providers may not recommend any screening for nonpregnant women who are considered low risk for pelvic cancer and who do not have symptoms. Ask your health care provider if a screening pelvic exam is right for you.  If you have had past treatment for cervical cancer or a condition that could lead to cancer, you need Pap tests and screening for cancer for at least 20 years after your treatment. If Pap tests have been discontinued, your risk factors (such as having a new sexual partner) need to be reassessed to determine if screening should resume. Some women have medical problems that increase the chance of getting cervical cancer. In these cases, your health care  provider may   recommend more frequent screening and Pap tests. Colorectal Cancer  This type of cancer can be detected and often prevented.  Routine colorectal cancer screening usually begins at 76 years of age and continues through 75 years of age.  Your health care provider may recommend screening at an earlier age if you have risk factors for colon cancer.  Your health care provider may also recommend using home test kits to check for hidden blood in the stool.  A small camera at the end of a tube can be used to examine your colon directly (sigmoidoscopy or colonoscopy). This is done to check for the earliest forms of colorectal cancer.  Routine screening usually begins at age 50.  Direct examination of the colon should be repeated every 5-10 years through 75 years of age. However, you may need to be screened more often if early forms of precancerous polyps or small growths are found. Skin Cancer  Check your skin from head to toe regularly.  Tell your health care provider about any new moles or changes in moles, especially if there is a change in a mole's shape or color.  Also tell your health care provider if you have a mole that is larger than the size of a pencil eraser.  Always use sunscreen. Apply sunscreen liberally and repeatedly throughout the day.  Protect yourself by wearing long sleeves, pants, a wide-brimmed hat, and sunglasses whenever you are outside. Heart disease, diabetes, and high blood pressure  High blood pressure causes heart disease and increases the risk of stroke. High blood pressure is more likely to develop in:  People who have blood pressure in the high end of the normal range (130-139/85-89 mm Hg).  People who are overweight or obese.  People who are African American.  If you are 18-39 years of age, have your blood pressure checked every 3-5 years. If you are 40 years of age or older, have your blood pressure checked every year. You should have your  blood pressure measured twice-once when you are at a hospital or clinic, and once when you are not at a hospital or clinic. Record the average of the two measurements. To check your blood pressure when you are not at a hospital or clinic, you can use:  An automated blood pressure machine at a pharmacy.  A home blood pressure monitor.  If you are between 55 years and 79 years old, ask your health care provider if you should take aspirin to prevent strokes.  Have regular diabetes screenings. This involves taking a blood sample to check your fasting blood sugar level.  If you are at a normal weight and have a low risk for diabetes, have this test once every three years after 76 years of age.  If you are overweight and have a high risk for diabetes, consider being tested at a younger age or more often. Preventing infection Hepatitis B  If you have a higher risk for hepatitis B, you should be screened for this virus. You are considered at high risk for hepatitis B if:  You were born in a country where hepatitis B is common. Ask your health care provider which countries are considered high risk.  Your parents were born in a high-risk country, and you have not been immunized against hepatitis B (hepatitis B vaccine).  You have HIV or AIDS.  You use needles to inject street drugs.  You live with someone who has hepatitis B.  You have had sex with someone who   has hepatitis B.  You get hemodialysis treatment.  You take certain medicines for conditions, including cancer, organ transplantation, and autoimmune conditions. Hepatitis C  Blood testing is recommended for:  Everyone born from [birth year] through 1965.  Anyone with known risk factors for hepatitis C. Sexually transmitted infections (STIs)  You should be screened for sexually transmitted infections (STIs) including gonorrhea and chlamydia if:  You are sexually active and are younger than 76 years of age.  You are older than 76  years of age and your health care provider tells you that you are at risk for this type of infection.  Your sexual activity has changed since you were last screened and you are at an increased risk for chlamydia or gonorrhea. Ask your health care provider if you are at risk.  If you do not have HIV, but are at risk, it may be recommended that you take a prescription medicine daily to prevent HIV infection. This is called pre-exposure prophylaxis (PrEP). You are considered at risk if:  You are sexually active and do not regularly use condoms or know the HIV status of your partner(s).  You take drugs by injection.  You are sexually active with a partner who has HIV. Talk with your health care provider about whether you are at high risk of being infected with HIV. If you choose to begin PrEP, you should first be tested for HIV. You should then be tested every 3 months for as long as you are taking PrEP. Pregnancy  If you are premenopausal and you may become pregnant, ask your health care provider about preconception counseling.  If you may become pregnant, take 400 to 800 micrograms (mcg) of folic acid every day.  If you want to prevent pregnancy, talk to your health care provider about birth control (contraception). Osteoporosis and menopause  Osteoporosis is a disease in which the bones lose minerals and strength with aging. This can result in serious bone fractures. Your risk for osteoporosis can be identified using a bone density scan.  If you are 64 years of age or older, or if you are at risk for osteoporosis and fractures, ask your health care provider if you should be screened.  Ask your health care provider whether you should take a calcium or vitamin D supplement to lower your risk for osteoporosis.  Menopause may have certain physical symptoms and risks.  Hormone replacement therapy may reduce some of these symptoms and risks. Talk to your health care provider about whether  hormone replacement therapy is right for you. Follow these instructions at home:  Schedule regular health, dental, and eye exams.  Stay current with your immunizations.  Do not use any tobacco products including cigarettes, chewing tobacco, or electronic cigarettes.  If you are pregnant, do not drink alcohol.  If you are breastfeeding, limit how much and how often you drink alcohol.  Limit alcohol intake to no more than 1 drink per day for nonpregnant women. One drink equals 12 ounces of beer, 5 ounces of wine, or 1 ounces of hard liquor.  Do not use street drugs.  Do not share needles.  Ask your health care provider for help if you need support or information about quitting drugs.  Tell your health care provider if you often feel depressed.  Tell your health care provider if you have ever been abused or do not feel safe at home. This information is not intended to replace advice given to you by your health care provider. Make  sure you discuss any questions you have with your health care provider. Document Released: 05/11/2011 Document Revised: 04/02/2016 Document Reviewed: 07/30/2015 Elsevier Interactive Patient Education  2017 [employer]   Hearing Loss Hearing loss is a partial or total loss of the ability to hear. This can be temporary or permanent, and it can happen in one or both ears. Hearing loss may be referred to as deafness. Medical care is necessary to treat hearing loss properly and to prevent the condition from getting worse. Your hearing may partially or completely come back, depending on what caused your hearing loss and how severe it is. In some cases, hearing loss is permanent. What are the causes? Common causes of hearing loss include:  Too much wax in the ear canal.  Infection of the ear canal or middle ear.  Fluid in the middle ear.  Injury to the ear or surrounding area.  An object stuck in the ear.  Prolonged exposure to loud sounds, such as  music. Less common causes of hearing loss include:  Tumors in the ear.  Viral or bacterial infections, such as meningitis.  A hole in the eardrum (perforated eardrum).  Problems with the hearing nerve that sends signals between the brain and the ear.  Certain medicines. What are the signs or symptoms? Symptoms of this condition may include:  Difficulty telling the difference between sounds.  Difficulty following a conversation when there is background noise.  Lack of response to sounds in your environment. This may be most noticeable when you do not respond to startling sounds.  Needing to turn up the volume on the television, radio, etc.  Ringing in the ears.  Dizziness.  Pain in the ears. How is this diagnosed? This condition is diagnosed based on a physical exam and a hearing test (audiometry). The audiometry test will be performed by a hearing specialist (audiologist). You may also be referred to an ear, nose, and throat (ENT) specialist (otolaryngologist). How is this treated? Treatment for recent onset of hearing loss may include:  Ear wax removal.  Being prescribed medicines to prevent infection (antibiotics).  Being prescribed medicines to reduce inflammation (corticosteroids). Follow these instructions at home:  If you were prescribed an antibiotic medicine, take it as told by your health care provider. Do not stop taking the antibiotic even if you start to feel better.  Take over-the-counter and prescription medicines only as told by your health care provider.  Avoid loud noises.  Return to your normal activities as told by your health care provider. Ask your health care provider what activities are safe for you.  Keep all follow-up visits as told by your health care provider. This is important. Contact a health care provider if:  You feel dizzy.  You develop new symptoms.  You vomit or feel nauseous.  You have a fever. Get help right away  if:  You develop sudden changes in your vision.  You have severe ear pain.  You have new or increased weakness.  You have a severe headache. This information is not intended to replace advice given to you by your health care provider. Make sure you discuss any questions you have with your health care provider. Document Released: 10/26/2005 Document Revised: 04/02/2016 Document Reviewed: 03/13/2015 Elsevier Interactive Patient Education  2017 [employer].

## 2017-03-26 DIAGNOSIS — L853 Xerosis cutis: Secondary | ICD-10-CM | POA: Diagnosis not present

## 2017-03-26 DIAGNOSIS — D225 Melanocytic nevi of trunk: Secondary | ICD-10-CM | POA: Diagnosis not present

## 2017-03-26 DIAGNOSIS — L821 Other seborrheic keratosis: Secondary | ICD-10-CM | POA: Diagnosis not present

## 2017-03-26 DIAGNOSIS — L570 Actinic keratosis: Secondary | ICD-10-CM | POA: Diagnosis not present

## 2017-03-26 DIAGNOSIS — D1801 Hemangioma of skin and subcutaneous tissue: Secondary | ICD-10-CM | POA: Diagnosis not present

## 2017-03-26 DIAGNOSIS — Z85828 Personal history of other malignant neoplasm of skin: Secondary | ICD-10-CM | POA: Diagnosis not present

## 2017-03-26 DIAGNOSIS — L738 Other specified follicular disorders: Secondary | ICD-10-CM | POA: Diagnosis not present

## 2017-04-16 ENCOUNTER — Other Ambulatory Visit: Payer: Self-pay | Admitting: *Deleted

## 2017-04-16 DIAGNOSIS — I10 Essential (primary) hypertension: Secondary | ICD-10-CM

## 2017-04-16 MED ORDER — AMLODIPINE BESYLATE 5 MG PO TABS: 5.0000 mg | ORAL_TABLET | Freq: Every day | ORAL | 3 refills | Status: DC

## 2017-04-16 MED ORDER — SIMVASTATIN 40 MG PO TABS: 40.0000 mg | ORAL_TABLET | Freq: Every day | ORAL | 3 refills | Status: DC

## 2017-04-16 MED ORDER — LEVOTHYROXINE SODIUM 100 MCG PO TABS: 100.0000 ug | ORAL_TABLET | Freq: Every day | ORAL | 0 refills | Status: DC

## 2017-04-20 ENCOUNTER — Other Ambulatory Visit: Payer: Self-pay | Admitting: *Deleted

## 2017-04-20 MED ORDER — LEVOTHYROXINE SODIUM 100 MCG PO TABS: 100.0000 ug | ORAL_TABLET | Freq: Every day | ORAL | 0 refills | Status: DC

## 2017-04-21 NOTE — Progress Notes (Signed)
I have reviewed this visit and discussed with Lauren Ducatte, RN, BSN, and agree with her documentation.   

## 2017-06-04 DIAGNOSIS — H43813 Vitreous degeneration, bilateral: Secondary | ICD-10-CM | POA: Diagnosis not present

## 2017-06-04 DIAGNOSIS — H5211 Myopia, right eye: Secondary | ICD-10-CM | POA: Diagnosis not present

## 2017-06-04 DIAGNOSIS — H52223 Regular astigmatism, bilateral: Secondary | ICD-10-CM | POA: Diagnosis not present

## 2017-06-04 DIAGNOSIS — Z961 Presence of intraocular lens: Secondary | ICD-10-CM | POA: Diagnosis not present

## 2017-06-04 DIAGNOSIS — H524 Presbyopia: Secondary | ICD-10-CM | POA: Diagnosis not present

## 2017-06-04 DIAGNOSIS — H353132 Nonexudative age-related macular degeneration, bilateral, intermediate dry stage: Secondary | ICD-10-CM | POA: Diagnosis not present

## 2017-06-24 ENCOUNTER — Ambulatory Visit (INDEPENDENT_AMBULATORY_CARE_PROVIDER_SITE_OTHER): Payer: PPO | Admitting: Internal Medicine

## 2017-06-24 ENCOUNTER — Encounter: Payer: Self-pay | Admitting: Internal Medicine

## 2017-06-24 VITALS — BP 130/78 | HR 97 | Temp 98.3°F | Wt 178.0 lb

## 2017-06-24 DIAGNOSIS — J029 Acute pharyngitis, unspecified: Secondary | ICD-10-CM

## 2017-06-24 MED ORDER — DEXAMETHASONE SODIUM PHOSPHATE 100 MG/10ML IJ SOLN
10.0000 mg | Freq: Once | INTRAMUSCULAR | Status: AC
  Administered 2017-06-24: 10 mg via INTRAMUSCULAR

## 2017-06-24 NOTE — Patient Instructions (Addendum)
It was so nice to meet you!  I think you probably have a virus that you picked up on the plane.   We gave you a steroid shot to help with your pain today. You should also take Ibuprofen and Tylenol as needed to help with the pain. Over-the-counter chloraspetic spray may also be helpful.  -Dr. Brett Albino
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
## 2017-08-17 ENCOUNTER — Other Ambulatory Visit: Payer: Self-pay | Admitting: *Deleted

## 2017-08-17 MED ORDER — LEVOTHYROXINE SODIUM 100 MCG PO TABS: 100.0000 ug | ORAL_TABLET | Freq: Every day | ORAL | 0 refills | Status: DC

## 2017-08-17 NOTE — Telephone Encounter (Signed)
Please have patient follow up for repeat TSH since we increased her dose at last visit.

## 2017-08-18 NOTE — Telephone Encounter (Signed)
LM for patient ok per DPR to schedule a lab visit to recheck her thyroid. Amber Allen,CMA

## 2017-08-19 NOTE — Telephone Encounter (Signed)
Appt 08/23/17 at 8:45 AM for TSH level.  Derl Barrow, RN

## 2017-08-23 ENCOUNTER — Other Ambulatory Visit: Payer: PPO

## 2017-08-23 DIAGNOSIS — E039 Hypothyroidism, unspecified: Secondary | ICD-10-CM | POA: Diagnosis not present

## 2017-08-24 LAB — TSH: TSH: 1.22 u[IU]/mL (ref 0.450–4.500)

## 2017-10-04 NOTE — Progress Notes (Signed)
   [facility] Phone: [phone]   Date of Visit: 10/05/2017   HPI:  Head Congestion:  - reports of 1 week history of rhinorrhea, nasal congestion, cough with intermittent sputum production, post-nasal drip, and right sinus pain. Reports that this started after her flight to [place]. Reports of intermittent specs of blood in the nasal discharge when she blows her nose and rarely in her sputum - denies any fevers or chills, night sweats  - no shortness of breath  - initially did have sore throat but this resolved - denies any nausea, vomiting, or diarrhea - has tried Vix vapor rub but no other medications - normal PO intake  - reports she does have allergies but this seems worse. Reports that she is not using any allergy medication. In the past she had similar episode and this improved with Flonase but she only used it for a week - current symptoms are stable and not worsening or improving    ROS: See HPI.  [place]: PMH: HTN HLD Chronic Rhinitis Diverticulosis Hypothyroidism Hx of Hyperplastic Colon Polyp Hx of Squamous Cell Carcinoma of the Hand Overweight  Depression Alcohol Abuse  PHYSICAL EXAM: BP 138/82   Pulse 80   Temp 98.4 F (36.9 C) (Oral)   Ht 5\' 6"  (1.676 m)   Wt 179 lb (81.2 kg)   BMI 28.89 kg/m  GEN: NAD, non-toxic appearing  HEENT: Atraumatic, normocephalic, neck supple, EOMI, sclera mildly red bilaterally with some tearing but no purulent discharge. Tympanic membranes normal bilaterally. Posterior oropharynx is mildly erythematous with a red flat spot on the left but no exudates. Nasal turbinates are not particularly swollen but appear irritated. NO sinus (maxillary and frontal) tenderness to palpation or percussion bilaterally CV: RRR, no murmurs, rubs, or gallops PULM: CTAB, normal effort SKIN: No rash or cyanosis; warm and well-perfused EXTR: No lower extremity edema or calf tenderness PSYCH: Mood and affect euthymic, normal  rate and volume of speech NEURO: Awake, alert, no focal deficits grossly, normal speech   ASSESSMENT/PLAN:  Health maintenance:  - declines Singrix vaccine  - declines mammogram  - declines colonoscopy. We discussed that she had a polyp that was removed during her last colonoscopy and GI recommended repeat scope in 5 years. She still declined because she had a bad experience with the anesthesia. I offered to refer to another GI but she declined this.  - declines Dexa scan    Nasal sinus congestion with cough:  I think her symptoms are either virally mediated or allergies. Due to her cough x 1 week I will obtain CXR but PNA is less likely. Discussed starting Allegra daily and using Atrovent nasal spray as prescribed until symptoms resolve. Return precautions discussed.   Smiley Houseman, MD PGY [place]

## 2017-10-05 ENCOUNTER — Encounter: Payer: Self-pay | Admitting: Internal Medicine

## 2017-10-05 ENCOUNTER — Ambulatory Visit: Payer: PPO | Admitting: Internal Medicine

## 2017-10-05 ENCOUNTER — Ambulatory Visit (HOSPITAL_COMMUNITY)
Admission: RE | Admit: 2017-10-05 | Discharge: 2017-10-05 | Disposition: A | Discharge status: Home / Self Care | Payer: PPO | Source: Ambulatory Visit | Attending: Family Medicine | Admitting: Family Medicine

## 2017-10-05 ENCOUNTER — Other Ambulatory Visit: Payer: Self-pay

## 2017-10-05 VITALS — BP 138/82 | HR 80 | Temp 98.4°F | Ht 66.0 in | Wt 179.0 lb

## 2017-10-05 DIAGNOSIS — R0981 Nasal congestion: Secondary | ICD-10-CM | POA: Diagnosis not present

## 2017-10-05 DIAGNOSIS — R059 Cough, unspecified: Secondary | ICD-10-CM

## 2017-10-05 DIAGNOSIS — R05 Cough: Secondary | ICD-10-CM | POA: Insufficient documentation

## 2017-10-05 MED ORDER — FEXOFENADINE HCL 180 MG PO TABS: 180.0000 mg | ORAL_TABLET | Freq: Every day | ORAL | 0 refills | Status: DC

## 2017-10-05 MED ORDER — IPRATROPIUM BROMIDE 0.06 % NA SOLN: 2.0000 | Freq: Four times a day (QID) | NASAL | 1 refills | Status: DC

## 2017-10-05 NOTE — Patient Instructions (Signed)
Lets get a chest xray today   We started you on allegra and atrovent nasal spray. For the nasal spray start with two sprays in each nostril and slowly increase only up to 4 times a day.

## 2017-10-06 ENCOUNTER — Telehealth: Payer: Self-pay | Admitting: Internal Medicine

## 2017-10-06 ENCOUNTER — Encounter: Payer: Self-pay | Admitting: Internal Medicine

## 2017-10-06 NOTE — Telephone Encounter (Signed)
Attempted to call patient but went to voicemail. Asked to call back. I wanted to inform her that her CXR is normal.

## 2017-10-06 NOTE — Telephone Encounter (Signed)
Pt called back and was informed. Cabella Kimm Dawn, CMA  

## 2017-11-01 ENCOUNTER — Other Ambulatory Visit: Payer: Self-pay | Admitting: Internal Medicine

## 2017-11-03 DIAGNOSIS — N3091 Cystitis, unspecified with hematuria: Secondary | ICD-10-CM | POA: Diagnosis not present

## 2017-11-03 DIAGNOSIS — N3001 Acute cystitis with hematuria: Secondary | ICD-10-CM | POA: Diagnosis not present

## 2017-11-26 ENCOUNTER — Other Ambulatory Visit: Payer: Self-pay | Admitting: Internal Medicine

## 2018-01-16 NOTE — Progress Notes (Signed)
   [facility] Phone: [phone]   Date of Visit: 01/17/2018   HPI: This visit was initially supposed to be a annual physical.  However, patient has a few concerns that she would like to address today.  Therefore we will change this to an acute visit.  Bowel movements: -Reports of a history of chronic intermittent diarrhea that is closely connected to mentally stressful episodes -She has not had any issues with diarrhea for the past 6 months until this Sunday. She had one episode of loose stools.  Stool did not have blood.  She does not think that she ate anything particularly different. -Prior to this she had 2 weeks of "balls of stool" that she did not have to strain to pass.  Then followed by 2 weeks of normal stool.  Since then she has been having the balls of stool. -Denies having any abdominal pain at that time. -She drinks about 32 ounces of water a day.   -Reports that she has not been eating properly recently but changed to a regular well-balanced diet about 3 weeks ago.  Urinary incontinence: -Reports a history of urinary incontinence since November 2018 -She would notice leakage of urine with coughing or sneezing.  She tried Keagle exercises which resolved her symptoms until this past Saturday -The Saturday she bent over to feed her cat and had an episode of urinary incontinence -Denies any urinary frequency, dysuria, hematuria -No history of tobacco use.  History of 2 vaginal deliveries and vaginal hysterectomy reports sometimes she would not go -Reports she would not go to the restroom for long periods of time due to her job.  She is able to empty her bladder fully.  Alcohol use: -Reports that she drinks 1 bottle of wine a day -Her mom and dad had issues with alcohol use -She wants to cut down on her intake.  She is aware how alcohol can negatively affect her body  -She has given up alcohol in the past during [place].  She has never had any withdrawal  symptoms  ROS: See HPI.  [place]:  PMH: HTN, Rhinitis, Colon Diverticulosis, Hypothyroidism, HLD   PHYSICAL EXAM: BP 112/72   Pulse 83   Temp 97.7 F (36.5 C) (Oral)   Ht 5\' 6"  (1.676 m)   Wt 181 lb (82.1 kg)   SpO2 98%   BMI 29.21 kg/m  GEN: NAD CV: RRR, no murmurs, rubs, or gallops PULM: CTAB, normal effort ABD: Soft, nontender, nondistended, NABS, no organomegaly SKIN: No rash or cyanosis; warm and well-perfused EXTR: No lower extremity edema or calf tenderness PSYCH: Mood and affect euthymic, normal rate and volume of speech NEURO: Awake, alert, no focal deficits grossly, normal speech;  ASSESSMENT/PLAN: Diarrhea:  One episode of loose stools which has now resolved. No red flags. Monitor for now.   Urinary Incontinence:  Consistent with stress incontinence. Only one episode in a long time. Discussed restarting Kegel exercises. UA with trace leukocytes. Sample not enough for urine culture. Unlikely this is UTI.   Alcoholism /alcohol abuse Is interested in cutting down.  She is not interested in going to Alcoholics Anonymous.  Patient came up with her own goal: Cut down to 3 glasses of wine a day by April 25th. Cut down to 2 glasses a day by May 26th.  Will obtain CMP today  FOLLOW UP: Follow up as soon as possible for annual physical.   Smiley Houseman, MD PGY [place]

## 2018-01-17 ENCOUNTER — Other Ambulatory Visit: Payer: Self-pay

## 2018-01-17 ENCOUNTER — Encounter: Payer: Self-pay | Admitting: Internal Medicine

## 2018-01-17 ENCOUNTER — Ambulatory Visit (INDEPENDENT_AMBULATORY_CARE_PROVIDER_SITE_OTHER): Payer: PPO | Admitting: Internal Medicine

## 2018-01-17 VITALS — BP 112/72 | HR 83 | Temp 97.7°F | Ht 66.0 in | Wt 181.0 lb

## 2018-01-17 DIAGNOSIS — IMO0001 Reserved for inherently not codable concepts without codable children: Secondary | ICD-10-CM

## 2018-01-17 DIAGNOSIS — I10 Essential (primary) hypertension: Secondary | ICD-10-CM | POA: Diagnosis not present

## 2018-01-17 DIAGNOSIS — E785 Hyperlipidemia, unspecified: Secondary | ICD-10-CM | POA: Diagnosis not present

## 2018-01-17 DIAGNOSIS — N393 Stress incontinence (female) (male): Secondary | ICD-10-CM

## 2018-01-17 DIAGNOSIS — R197 Diarrhea, unspecified: Secondary | ICD-10-CM | POA: Diagnosis not present

## 2018-01-17 DIAGNOSIS — F1020 Alcohol dependence, uncomplicated: Secondary | ICD-10-CM

## 2018-01-17 LAB — POCT URINALYSIS DIP (MANUAL ENTRY)
Bilirubin, UA: NEGATIVE
Blood, UA: NEGATIVE
GLUCOSE UA: NEGATIVE mg/dL
Ketones, POC UA: NEGATIVE mg/dL
NITRITE UA: NEGATIVE
Protein Ur, POC: NEGATIVE mg/dL
UROBILINOGEN UA: 0.2 U/dL
pH, UA: 5.5 (ref 5.0–8.0)

## 2018-01-17 NOTE — Patient Instructions (Addendum)
Goal:  Cut down to 3 glasses a day by April 25th Cut down to 2 glasses a day by May 26th.   Incontinence:  - lets try Kegle exercises  - lets get a urine sample today   Bowel:  - I would monitor this for now  - if you continue to have diarrhea please let me know   Please make a follow up visit for your physical as soon as possible

## 2018-01-17 NOTE — Assessment & Plan Note (Addendum)
Is interested in cutting down.  She is not interested in going to Alcoholics Anonymous.  Patient came up with her own goal: Cut down to 3 glasses of wine a day by April 25th. Cut down to 2 glasses a day by May 26th.  Will obtain CMP today

## 2018-01-18 ENCOUNTER — Encounter: Payer: Self-pay | Admitting: Internal Medicine

## 2018-01-18 LAB — CMP14+EGFR
ALBUMIN: 4.2 g/dL (ref 3.5–4.8)
ALK PHOS: 108 IU/L (ref 39–117)
ALT: 17 IU/L (ref 0–32)
AST: 20 IU/L (ref 0–40)
Albumin/Globulin Ratio: 1.7 (ref 1.2–2.2)
BILIRUBIN TOTAL: 0.3 mg/dL (ref 0.0–1.2)
BUN/Creatinine Ratio: 15 (ref 12–28)
BUN: 12 mg/dL (ref 8–27)
CHLORIDE: 104 mmol/L (ref 96–106)
CO2: 22 mmol/L (ref 20–29)
CREATININE: 0.81 mg/dL (ref 0.57–1.00)
Calcium: 9.4 mg/dL (ref 8.7–10.3)
GFR calc Af Amer: 82 mL/min/{1.73_m2} (ref >59)
GFR calc non Af Amer: 71 mL/min/{1.73_m2} (ref >59)
GLUCOSE: 92 mg/dL (ref 65–99)
Globulin, Total: 2.5 g/dL (ref 1.5–4.5)
Potassium: 4.6 mmol/L (ref 3.5–5.2)
Sodium: 141 mmol/L (ref 134–144)
Total Protein: 6.7 g/dL (ref 6.0–8.5)

## 2018-01-18 NOTE — Progress Notes (Signed)
Sent letter with normal CMP

## 2018-01-26 NOTE — Progress Notes (Signed)
   [facility] Phone: [phone]   Date of Visit: 01/27/2018   HPI:  Patient presents today for a well woman exam.   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  Otherwise she is not very active.  We discussed about this and she is motivated to start increasing her physical activity. Diet: fast food 1-2 times a week.  Otherwise reports a well-balanced diet Smoking: denies Alcohol: cutting down to 2-3 glasses of wine a night. She has decreased her intake since the last time we spoke earlier this month.  She had made significant improvements but she should be proud of. Drugs: denies Mood: PHQ2 negative Dentist: yes  [place] study: patient was in a weight loss study, and will get bone density scan in April    ROS: Review of Systems  Constitutional: Negative for chills, fever and weight loss.  Eyes: Negative for blurred vision and double vision.  Respiratory: Negative for shortness of breath.   Cardiovascular: Negative for chest pain, orthopnea and leg swelling.  Gastrointestinal: Negative for abdominal pain, constipation, diarrhea, nausea and vomiting.  Genitourinary: Negative for dysuria and frequency.  Skin: Negative for itching and rash.  Neurological: Negative for sensory change, focal weakness and headaches.  Psychiatric/Behavioral: Positive for substance abuse. Negative for depression.    [place]:  Cancers in family: Denies any cancers in the family  PHYSICAL EXAM: BP 132/64   Pulse 81   Temp 98.3 F (36.8 C) (Oral)   Ht 5\' 6"  (1.676 m)   Wt 182 lb (82.6 kg)   SpO2 98%   BMI 29.38 kg/m  Gen: NAD, pleasant, cooperative HEENT: NCAT, PERRL, no palpable thyromegaly or  anterior cervical lymphadenopathy Heart: RRR, no murmurs Lungs: CTAB, NWOB Abdomen: soft, nontender to palpation Breast exam: No asymmetry, rash, nipple inversion.  Right breast appears normal on palpation.  Left breast has few palpable nodules that are mobile lateral to the nipple.  Additionally her left axilla seems more full than the right.  No palpable lymph nodes on either axilla on my exam. Neuro: grossly nonfocal, speech normal GU: Declines a pelvic exam  ASSESSMENT/PLAN:  # Health maintenance:  -STD screening: Declines -mammogram: After discussing the clinical breast exam with the patient she is willing to proceed with a mammogram.  Diagnostic mammogram with left breast and axilla ultrasound ordered.  Given patient contact information to set up the appointment. -lipid screening: Will complete today -DEXA: Last bone density scan in 2011 showed a low bone mass.  Patient reports that she will get a bone density scan with [facility].  Will obtain vitamin D levels today -immunizations: She declines shingles X vaccine for now.  She has had the Zostavax in the past.  Hypothyroidism: We will repeat TSH today  Follow-up will depend on the results of her workup and lab work.  If everything is unremarkable, I think she can follow-up in 6 months or sooner if she has any concerns.  Smiley Houseman, MD PGY [place]

## 2018-01-27 ENCOUNTER — Other Ambulatory Visit: Payer: Self-pay

## 2018-01-27 ENCOUNTER — Encounter: Payer: Self-pay | Admitting: Internal Medicine

## 2018-01-27 ENCOUNTER — Other Ambulatory Visit: Payer: Self-pay | Admitting: Internal Medicine

## 2018-01-27 ENCOUNTER — Ambulatory Visit (INDEPENDENT_AMBULATORY_CARE_PROVIDER_SITE_OTHER): Payer: PPO | Admitting: Internal Medicine

## 2018-01-27 VITALS — BP 132/64 | HR 81 | Temp 98.3°F | Ht 66.0 in | Wt 182.0 lb

## 2018-01-27 DIAGNOSIS — Z01411 Encounter for gynecological examination (general) (routine) with abnormal findings: Secondary | ICD-10-CM | POA: Diagnosis not present

## 2018-01-27 DIAGNOSIS — M859 Disorder of bone density and structure, unspecified: Secondary | ICD-10-CM

## 2018-01-27 DIAGNOSIS — N6459 Other signs and symptoms in breast: Secondary | ICD-10-CM | POA: Diagnosis not present

## 2018-01-27 DIAGNOSIS — E785 Hyperlipidemia, unspecified: Secondary | ICD-10-CM | POA: Diagnosis not present

## 2018-01-27 DIAGNOSIS — M8580 Other specified disorders of bone density and structure, unspecified site: Secondary | ICD-10-CM | POA: Diagnosis not present

## 2018-01-27 DIAGNOSIS — N6320 Unspecified lump in the left breast, unspecified quadrant: Secondary | ICD-10-CM

## 2018-01-27 DIAGNOSIS — Z0000 Encounter for general adult medical examination without abnormal findings: Secondary | ICD-10-CM

## 2018-01-27 DIAGNOSIS — E039 Hypothyroidism, unspecified: Secondary | ICD-10-CM

## 2018-01-27 NOTE — Patient Instructions (Addendum)
You should be so proud of reaching your goal so fast! Continue to work on this!  When you are up for it, let me know how you are doing with your exercise  Let's get labs today   I ordered a mammogram for you. Please call the breast center to set up an appointment.

## 2018-01-28 ENCOUNTER — Telehealth: Payer: Self-pay | Admitting: Internal Medicine

## 2018-01-28 ENCOUNTER — Encounter: Payer: Self-pay | Admitting: Internal Medicine

## 2018-01-28 LAB — LIPID PANEL
CHOLESTEROL TOTAL: 199 mg/dL (ref 100–199)
Chol/HDL Ratio: 2.9 ratio (ref 0.0–4.4)
HDL: 69 mg/dL (ref >39)
LDL Calculated: 109 mg/dL — ABNORMAL HIGH (ref 0–99)
Triglycerides: 107 mg/dL (ref 0–149)
VLDL Cholesterol Cal: 21 mg/dL (ref 5–40)

## 2018-01-28 LAB — TSH: TSH: 5.16 u[IU]/mL — ABNORMAL HIGH (ref 0.450–4.500)

## 2018-01-28 LAB — VITAMIN D 25 HYDROXY (VIT D DEFICIENCY, FRACTURES): VIT D 25 HYDROXY: 27.2 ng/mL — AB (ref 30.0–100.0)

## 2018-01-28 NOTE — Telephone Encounter (Signed)
Attempted to call patient to discuss lab results. Went to voicemail and left message to call back.

## 2018-01-28 NOTE — Telephone Encounter (Signed)
Pt returning call. Her call back [phone] Wallace Cullens, RN

## 2018-01-31 NOTE — Telephone Encounter (Signed)
Called patient to discuss labs. TSH slightly elevated. She has missed a few doses. Therefore will continue current dose and recheck in 6-8 weeks.   Vitamin D level low. REcommended Vitamin D 1000IU daily. Patient would like to get this over the counter   Lipid panel okay. Continue current statin

## 2018-01-31 NOTE — Telephone Encounter (Signed)
Pt left message on nurse line x 2 requesting to speak to MD. Call back [phone] Wallace Cullens, RN

## 2018-02-01 ENCOUNTER — Ambulatory Visit
Admission: RE | Admit: 2018-02-01 | Discharge: 2018-02-01 | Disposition: A | Discharge status: Home / Self Care | Payer: PPO | Source: Ambulatory Visit | Attending: Family Medicine | Admitting: Family Medicine

## 2018-02-01 DIAGNOSIS — N6320 Unspecified lump in the left breast, unspecified quadrant: Secondary | ICD-10-CM | POA: Diagnosis not present

## 2018-02-01 DIAGNOSIS — R922 Inconclusive mammogram: Secondary | ICD-10-CM | POA: Diagnosis not present

## 2018-03-04 ENCOUNTER — Telehealth: Payer: Self-pay | Admitting: Internal Medicine

## 2018-03-04 NOTE — Telephone Encounter (Signed)
Pt came in office and turned in Results of HOPE study. Results (Yellow envelope) were placed in Dr.Gunadasa's box.

## 2018-03-04 NOTE — Telephone Encounter (Signed)
Will forward to MD to make her aware.  Casimer Russett,CMA  

## 2018-03-14 NOTE — Telephone Encounter (Signed)
noted 

## 2018-03-22 ENCOUNTER — Other Ambulatory Visit: Payer: Self-pay | Admitting: Internal Medicine

## 2018-03-25 ENCOUNTER — Telehealth: Payer: Self-pay | Admitting: Internal Medicine

## 2018-03-25 DIAGNOSIS — M8580 Other specified disorders of bone density and structure, unspecified site: Secondary | ICD-10-CM | POA: Insufficient documentation

## 2018-03-25 NOTE — Telephone Encounter (Signed)
From bone density scan (Scanned) 02/2018: osteopenia with T -1.4 in neck. Frax score: Major fracture 24% and hip 14%. Discussed with patient who is interested in starting daily bisphosphonate. However, she is getting dental work done in the near future. Therefore will delay the start of bisphosphonate until after. She is taking Vit D 1000IU and will start calcium. She will call and update [place] after the dental work.

## 2018-03-29 DIAGNOSIS — D225 Melanocytic nevi of trunk: Secondary | ICD-10-CM | POA: Diagnosis not present

## 2018-03-29 DIAGNOSIS — L570 Actinic keratosis: Secondary | ICD-10-CM | POA: Diagnosis not present

## 2018-03-29 DIAGNOSIS — L738 Other specified follicular disorders: Secondary | ICD-10-CM | POA: Diagnosis not present

## 2018-03-29 DIAGNOSIS — L814 Other melanin hyperpigmentation: Secondary | ICD-10-CM | POA: Diagnosis not present

## 2018-03-29 DIAGNOSIS — D1801 Hemangioma of skin and subcutaneous tissue: Secondary | ICD-10-CM | POA: Diagnosis not present

## 2018-03-29 DIAGNOSIS — D171 Benign lipomatous neoplasm of skin and subcutaneous tissue of trunk: Secondary | ICD-10-CM | POA: Diagnosis not present

## 2018-03-29 DIAGNOSIS — L821 Other seborrheic keratosis: Secondary | ICD-10-CM | POA: Diagnosis not present

## 2018-03-29 DIAGNOSIS — Z85828 Personal history of other malignant neoplasm of skin: Secondary | ICD-10-CM | POA: Diagnosis not present

## 2018-03-29 DIAGNOSIS — L820 Inflamed seborrheic keratosis: Secondary | ICD-10-CM | POA: Diagnosis not present

## 2018-03-31 ENCOUNTER — Encounter: Payer: Self-pay | Admitting: Internal Medicine

## 2018-04-07 ENCOUNTER — Other Ambulatory Visit: Payer: Self-pay | Admitting: *Deleted

## 2018-04-07 MED ORDER — VENLAFAXINE HCL 100 MG PO TABS: 100.0000 mg | ORAL_TABLET | Freq: Two times a day (BID) | ORAL | 3 refills | Status: DC

## 2018-04-11 ENCOUNTER — Encounter: Payer: Self-pay | Admitting: Internal Medicine

## 2018-04-11 ENCOUNTER — Ambulatory Visit (INDEPENDENT_AMBULATORY_CARE_PROVIDER_SITE_OTHER): Payer: PPO | Admitting: Internal Medicine

## 2018-04-11 ENCOUNTER — Other Ambulatory Visit: Payer: Self-pay

## 2018-04-11 VITALS — BP 100/60 | HR 97 | Temp 98.1°F | Wt 176.0 lb

## 2018-04-11 DIAGNOSIS — J302 Other seasonal allergic rhinitis: Secondary | ICD-10-CM

## 2018-04-11 NOTE — Patient Instructions (Signed)
I think your symptoms are either due to a virus or seasonal allergies.  I would take Allegra and Flonase every day for the next week or two.  Follow up if symptoms worsen or do not improve.

## 2018-04-11 NOTE — Progress Notes (Signed)
   [facility] Phone: [phone]   Date of Visit: 04/11/2018   HPI:  Sore Throat:  - reports of 2 day history of nasal congestion/rhinorrhea, sore throat, sweating and sneezing - denies cough but does cough intermittently during the visit - no sinus discomfort but reports of post nasal drip - no fevers - no watery/itchy eyes - no shortness of breath or wheezing  - has history of seasonal allergies. Has Allegra and Flonase at home, but does not use. Used flonase once since getting her symptoms. Has history of chronic rhinitis  - has done some salt water gargle  - no sick contacts  ROS: See HPI.  [place]:  PMH: Hypothyroidism Chronic Rhinitis  Osteopenia  PHYSICAL EXAM: BP 100/60   Pulse 97   Temp 98.1 F (36.7 C) (Oral)   Wt 176 lb (79.8 kg)   SpO2 96%   BMI 28.41 kg/m  GEN: NAD, nontoxic appearing  HEENT: Atraumatic, normocephalic, neck supple without lymphadenopathy, EOMI, sclera clear, pharynx mildly erythematous without exudates.  CV: RRR, no murmurs, rubs, or gallops PULM: CTAB, normal effort SKIN: No rash or cyanosis; warm and well-perfused PSYCH: Mood and affect euthymic, normal rate and volume of speech NEURO: Awake, alert, no focal deficits grossly, normal speech   ASSESSMENT/PLAN:  1. Seasonal allergies Symptoms consistent with seasonal allergies or viral syndrome. Not consistent with strep pharyngitis or PNA. Vitals stable and afebrile. Recommended daily use of Allegra and FLonase until symptoms improve. Return precautions discussed.    Smiley Houseman, MD PGY [place]

## 2018-05-11 ENCOUNTER — Other Ambulatory Visit: Payer: Self-pay | Admitting: *Deleted

## 2018-05-11 DIAGNOSIS — I10 Essential (primary) hypertension: Secondary | ICD-10-CM

## 2018-05-11 MED ORDER — AMLODIPINE BESYLATE 5 MG PO TABS: 5.0000 mg | ORAL_TABLET | Freq: Every day | ORAL | 3 refills | Status: DC

## 2018-05-26 ENCOUNTER — Other Ambulatory Visit: Payer: Self-pay

## 2018-05-26 ENCOUNTER — Encounter: Payer: Self-pay | Admitting: Family Medicine

## 2018-05-26 ENCOUNTER — Ambulatory Visit (INDEPENDENT_AMBULATORY_CARE_PROVIDER_SITE_OTHER): Payer: PPO | Admitting: Family Medicine

## 2018-05-26 VITALS — BP 110/70 | HR 86 | Temp 97.6°F | Wt 172.0 lb

## 2018-05-26 DIAGNOSIS — R1310 Dysphagia, unspecified: Secondary | ICD-10-CM | POA: Insufficient documentation

## 2018-05-26 NOTE — Patient Instructions (Signed)

## 2018-05-26 NOTE — Progress Notes (Signed)
   Subjective:    Patient ID: Amber Allen, female    DOB: 05/02/1941, 77 y.o.   MRN: [id number]   CC: Difficulty swallowing   HPI: Patient is a 77 yo female with complex past medical history who presents today complaining of difficulty swallowing. Patient reports she first noticed symptoms two weeks ago while on vacation. Since then she has had difficulty swallowing food but denies any problems with water. This a a new problem for her. She denies any prior history of dysphagia. Patient never smoke but has a history of alcohol abuse (currently in [place]). Patient also denies any reflux symptoms. No weight loss, night sweats, vomiting, abdominal pain.   Smoking status reviewed   ROS: all other systems were reviewed and are negative other than in the HPI   Past Medical History:  Diagnosis Date  . Cancer ([place]) 2015   Skin  . Cholecystolithiasis   . De Quervain's tenosynovitis   . De Quervain's tenosynovitis, left 09/09/2012  . Depression   . DIVERTICULITIS, HX OF 06/27/2008  . Diverticulosis   . Hyperlipidemia   . Hypertension   . Thyroid disease   . Unspecified vitamin D deficiency 12/12/2008    Past Surgical History:  Procedure Laterality Date  . APPENDECTOMY  1952  . BREAST BIOPSY  1994   no malignancy  . BREAST EXCISIONAL BIOPSY Left 1994  . TONSILLECTOMY    . VAGINAL HYSTERECTOMY  1974   including cervix    Past medical history, surgical, family, and social history reviewed and updated in the EMR as appropriate.  Objective:  BP 110/70   Pulse 86   Temp 97.6 F (36.4 C) (Oral)   Wt 172 lb (78 kg)   SpO2 95%   BMI 27.76 kg/m   Vitals and nursing note reviewed  General: NAD, pleasant, able to participate in exam Cardiac: RRR, normal heart sounds, no murmurs. 2+ radial and PT pulses bilaterally Respiratory: CTAB, normal effort, No wheezes, rales or rhonchi Abdomen: soft, nontender, nondistended, no hepatic or splenomegaly, +BS Extremities: no edema or cyanosis.  WWP. Skin: warm and dry, no rashes noted Neuro: alert and oriented x4, no focal deficits Psych: Normal affect and mood   Assessment & Plan:   Dysphagia Patient presents with dysphagia with solids. This appear to be a new problem. Patient denies any problem with fluids. No red flags. Given age likely to be secondary to possible stricture vs achalasia vs other esophageal dysmotility. Patient will need a EGD for further assessment. Will refer to GI. In the meantime, recommend smaller bites, increase mastication, and slowly her pace since patient reports that she is a "fast eater". --GI referral for EGD and possible balloon dilation? Vs Manometry?   Marjie Skiff, MD [place] PGY-3

## 2018-05-26 NOTE — Assessment & Plan Note (Signed)
Patient presents with dysphagia with solids. This appear to be a new problem. Patient denies any problem with fluids. No red flags. Given age likely to be secondary to possible stricture vs achalasia vs other esophageal dysmotility. Patient will need a EGD for further assessment. Will refer to GI. In the meantime, recommend smaller bites, increase mastication, and slowly her pace since patient reports that she is a "fast eater". --GI referral for EGD and possible balloon dilation? Vs Manometry?

## 2018-05-30 DIAGNOSIS — H1011 Acute atopic conjunctivitis, right eye: Secondary | ICD-10-CM | POA: Diagnosis not present

## 2018-06-02 DIAGNOSIS — H11421 Conjunctival edema, right eye: Secondary | ICD-10-CM | POA: Diagnosis not present

## 2018-06-02 DIAGNOSIS — H109 Unspecified conjunctivitis: Secondary | ICD-10-CM | POA: Diagnosis not present

## 2018-06-06 DIAGNOSIS — H01024 Squamous blepharitis left upper eyelid: Secondary | ICD-10-CM | POA: Diagnosis not present

## 2018-06-06 DIAGNOSIS — H01021 Squamous blepharitis right upper eyelid: Secondary | ICD-10-CM | POA: Diagnosis not present

## 2018-06-06 DIAGNOSIS — H109 Unspecified conjunctivitis: Secondary | ICD-10-CM | POA: Diagnosis not present

## 2018-06-08 DIAGNOSIS — R1310 Dysphagia, unspecified: Secondary | ICD-10-CM | POA: Diagnosis not present

## 2018-06-08 DIAGNOSIS — E039 Hypothyroidism, unspecified: Secondary | ICD-10-CM | POA: Diagnosis not present

## 2018-06-08 DIAGNOSIS — E782 Mixed hyperlipidemia: Secondary | ICD-10-CM | POA: Diagnosis not present

## 2018-06-09 ENCOUNTER — Other Ambulatory Visit: Payer: Self-pay | Admitting: Internal Medicine

## 2018-06-10 DIAGNOSIS — H109 Unspecified conjunctivitis: Secondary | ICD-10-CM | POA: Diagnosis not present

## 2018-06-10 DIAGNOSIS — H11423 Conjunctival edema, bilateral: Secondary | ICD-10-CM | POA: Diagnosis not present

## 2018-06-13 DIAGNOSIS — H11423 Conjunctival edema, bilateral: Secondary | ICD-10-CM | POA: Diagnosis not present

## 2018-06-13 DIAGNOSIS — H109 Unspecified conjunctivitis: Secondary | ICD-10-CM | POA: Diagnosis not present

## 2018-06-16 DIAGNOSIS — K222 Esophageal obstruction: Secondary | ICD-10-CM | POA: Diagnosis not present

## 2018-06-16 DIAGNOSIS — K449 Diaphragmatic hernia without obstruction or gangrene: Secondary | ICD-10-CM | POA: Diagnosis not present

## 2018-06-16 DIAGNOSIS — R1310 Dysphagia, unspecified: Secondary | ICD-10-CM | POA: Diagnosis not present

## 2018-06-20 DIAGNOSIS — H109 Unspecified conjunctivitis: Secondary | ICD-10-CM | POA: Diagnosis not present

## 2018-06-23 ENCOUNTER — Other Ambulatory Visit: Payer: Self-pay | Admitting: *Deleted

## 2018-06-23 MED ORDER — SIMVASTATIN 40 MG PO TABS: 40.0000 mg | ORAL_TABLET | Freq: Every day | ORAL | 3 refills | Status: DC

## 2018-06-27 DIAGNOSIS — H11423 Conjunctival edema, bilateral: Secondary | ICD-10-CM | POA: Diagnosis not present

## 2018-06-28 DIAGNOSIS — H02834 Dermatochalasis of left upper eyelid: Secondary | ICD-10-CM | POA: Diagnosis not present

## 2018-06-28 DIAGNOSIS — H02831 Dermatochalasis of right upper eyelid: Secondary | ICD-10-CM | POA: Diagnosis not present

## 2018-06-28 DIAGNOSIS — I10 Essential (primary) hypertension: Secondary | ICD-10-CM | POA: Diagnosis not present

## 2018-06-28 DIAGNOSIS — H1013 Acute atopic conjunctivitis, bilateral: Secondary | ICD-10-CM | POA: Diagnosis not present

## 2018-06-28 DIAGNOSIS — H18413 Arcus senilis, bilateral: Secondary | ICD-10-CM | POA: Diagnosis not present

## 2018-06-28 DIAGNOSIS — Z961 Presence of intraocular lens: Secondary | ICD-10-CM | POA: Diagnosis not present

## 2018-06-30 ENCOUNTER — Encounter: Payer: Self-pay | Admitting: Family Medicine

## 2018-07-01 DIAGNOSIS — H1013 Acute atopic conjunctivitis, bilateral: Secondary | ICD-10-CM | POA: Diagnosis not present

## 2018-07-13 DIAGNOSIS — R1310 Dysphagia, unspecified: Secondary | ICD-10-CM | POA: Diagnosis not present

## 2018-07-13 DIAGNOSIS — K222 Esophageal obstruction: Secondary | ICD-10-CM | POA: Diagnosis not present

## 2018-07-13 DIAGNOSIS — K449 Diaphragmatic hernia without obstruction or gangrene: Secondary | ICD-10-CM | POA: Diagnosis not present

## 2018-07-19 DIAGNOSIS — H18413 Arcus senilis, bilateral: Secondary | ICD-10-CM | POA: Diagnosis not present

## 2018-07-19 DIAGNOSIS — H1013 Acute atopic conjunctivitis, bilateral: Secondary | ICD-10-CM | POA: Diagnosis not present

## 2018-07-19 DIAGNOSIS — Z961 Presence of intraocular lens: Secondary | ICD-10-CM | POA: Diagnosis not present

## 2018-07-19 DIAGNOSIS — H02831 Dermatochalasis of right upper eyelid: Secondary | ICD-10-CM | POA: Diagnosis not present

## 2018-07-28 ENCOUNTER — Other Ambulatory Visit: Payer: Self-pay | Admitting: Gastroenterology

## 2018-07-28 DIAGNOSIS — R1310 Dysphagia, unspecified: Secondary | ICD-10-CM

## 2018-08-12 ENCOUNTER — Ambulatory Visit (HOSPITAL_COMMUNITY)
Admission: RE | Admit: 2018-08-12 | Discharge: 2018-08-12 | Disposition: A | Discharge status: Home / Self Care | Payer: PPO | Source: Ambulatory Visit | Attending: Gastroenterology | Admitting: Gastroenterology

## 2018-08-12 DIAGNOSIS — R1310 Dysphagia, unspecified: Secondary | ICD-10-CM | POA: Insufficient documentation

## 2018-08-12 DIAGNOSIS — K449 Diaphragmatic hernia without obstruction or gangrene: Secondary | ICD-10-CM | POA: Insufficient documentation

## 2018-08-12 DIAGNOSIS — K225 Diverticulum of esophagus, acquired: Secondary | ICD-10-CM | POA: Insufficient documentation

## 2018-08-15 DIAGNOSIS — R1310 Dysphagia, unspecified: Secondary | ICD-10-CM | POA: Diagnosis not present

## 2018-08-21 ENCOUNTER — Telehealth: Payer: Self-pay | Admitting: Family Medicine

## 2018-08-21 DIAGNOSIS — S99911A Unspecified injury of right ankle, initial encounter: Secondary | ICD-10-CM | POA: Diagnosis not present

## 2018-08-21 DIAGNOSIS — S82831A Other fracture of upper and lower end of right fibula, initial encounter for closed fracture: Secondary | ICD-10-CM

## 2018-08-21 NOTE — Telephone Encounter (Signed)
Patient of Dr. Andy Gauss calls after hours line for ortho referral.  Twisted her ankle and went to [facility] in [place].  Was diagnosed via XR with a fibular fracture (received a call from the radiologist) and told to have her pcp order ortho referral.  We have not seen this imaging and urgent care is closed currently.  Her pcp is on vacation for the next 2 days so I am ordering referral off her reading me the urgent care report over the phone.  Patient can ambulate with discomfort, there is no open wound.  -Dr. Criss Rosales

## 2018-08-22 DIAGNOSIS — S82831A Other fracture of upper and lower end of right fibula, initial encounter for closed fracture: Secondary | ICD-10-CM | POA: Diagnosis not present

## 2018-09-05 DIAGNOSIS — S82831D Other fracture of upper and lower end of right fibula, subsequent encounter for closed fracture with routine healing: Secondary | ICD-10-CM | POA: Diagnosis not present

## 2018-09-09 DIAGNOSIS — H5211 Myopia, right eye: Secondary | ICD-10-CM | POA: Diagnosis not present

## 2018-09-09 DIAGNOSIS — H52223 Regular astigmatism, bilateral: Secondary | ICD-10-CM | POA: Diagnosis not present

## 2018-09-09 DIAGNOSIS — H524 Presbyopia: Secondary | ICD-10-CM | POA: Diagnosis not present

## 2018-09-09 DIAGNOSIS — H5202 Hypermetropia, left eye: Secondary | ICD-10-CM | POA: Diagnosis not present

## 2018-09-28 ENCOUNTER — Other Ambulatory Visit: Payer: Self-pay | Admitting: *Deleted

## 2018-09-28 MED ORDER — LEVOTHYROXINE SODIUM 100 MCG PO TABS: 100.0000 ug | ORAL_TABLET | Freq: Every day | ORAL | 3 refills | Status: DC

## 2018-09-29 ENCOUNTER — Ambulatory Visit: Payer: PPO

## 2019-01-20 ENCOUNTER — Telehealth: Payer: Self-pay | Admitting: *Deleted

## 2019-01-20 NOTE — Telephone Encounter (Signed)
Pt wants to let MD know that she woke up this am with cold symptoms.    She has not traveled and is not running a fever.  She prefers to not make an appt but will call if she feels worse or runs a fever.  She just wanted to let MD know that she was "self-quarantining" herself.  Advised to rest and drink plenty of fluid.  We would call back if there were additional instructions from MD. Christen Bame, [place]

## 2019-01-30 ENCOUNTER — Encounter: Payer: PPO | Admitting: Family Medicine

## 2019-03-10 ENCOUNTER — Other Ambulatory Visit: Payer: Self-pay

## 2019-03-10 ENCOUNTER — Encounter: Payer: Self-pay | Admitting: Family Medicine

## 2019-03-10 ENCOUNTER — Telehealth (INDEPENDENT_AMBULATORY_CARE_PROVIDER_SITE_OTHER): Payer: PPO | Admitting: Family Medicine

## 2019-03-10 VITALS — Wt 170.0 lb

## 2019-03-10 DIAGNOSIS — R109 Unspecified abdominal pain: Secondary | ICD-10-CM | POA: Diagnosis not present

## 2019-03-10 NOTE — Progress Notes (Signed)
Patient called to start virtual visit.  She doesn't have BP equipment at home but has weighed recently.  Nursing assessment questionnaires negative. Merrell Rettinger,CMA

## 2019-03-10 NOTE — Assessment & Plan Note (Addendum)
Patient called reporting left sided abdominal pain since this morning concerning for diverticulitis. Patient denies any nausea, vomiting, fever, chills or change in bowel movements (no blood). Patient does have a history of diverticulitis 20 years ago. Symptoms are not consistent with an acute diverticulitis flare up.  She might be developing one but does not required antibiotic at this moment. Discuss pain control with tylenol and conservative management with bland/liquid diet for the next two days. If symptoms do not improve or worsening will consider in office visit vs antibiotic. Patient was agreeable with plan, she will follow up with [place] next week.

## 2019-03-10 NOTE — Progress Notes (Signed)
[facility] Telemedicine Visit  Patient consented to have virtual visit. Method of visit: Telephone  Encounter participants: Patient: Amber Allen - located at Home Provider: Marjie Skiff - located at [facility] Others (if applicable): None  Chief Complaint: Left sided abdominal pain  HPI: Patient is 78 yo female who is calling today to discuss left sided abdominal pain. Patient reports she started to experience the pain this morning and it feels like her diverticulitis episode that she had years ago. Patient reports that pain is 2-3/10 when sitting still and 6-7 when moving around. She denies any fevers, chills, nausea, vomiting, diarrhea or change in bowel regimen. Patient has not taken anything for her pain. She wanted to make sure she was not developing an acute episode of diverticulitis.  ROS: per HPI  Pertinent PMHx: Diverticulosis  Exam:  Cardiac: No palpitations Respiratory: No increase WOB, no wheezing Abdominal: Left sided tenderness Neuro: No focal deficits, AOx4  Assessment/Plan:  Left sided abdominal pain Patient called reporting left sided abdominal pain since this morning concerning for diverticulitis. Patient denies any nausea, vomiting, fever, chills or change in bowel movements (no blood). Patient does have a history of diverticulitis 20 years ago. Symptoms are not consistent with an acute diverticulitis flare up.  She might be developing one but does not required antibiotic at this moment. Discuss pain control with tylenol and conservative management with bland/liquid diet for the next two days. If symptoms do not improve or worsening will consider in office visit vs antibiotic. Patient was agreeable with plan, she will follow up with [place] next week.    Time spent during visit with patient: 11 minutes

## 2019-03-13 ENCOUNTER — Telehealth: Payer: Self-pay | Admitting: *Deleted

## 2019-03-13 NOTE — Telephone Encounter (Signed)
Pt called because she is still having pain but has been a "tremendous decrease".  She declines telemedicine visit at this time as she wants to follow up with Dr. Andy Gauss.   She is still following a soft diet and is not running a fever.  Is only tender when "she presses hard".  Will forward to MD. Christen Bame, CMA

## 2019-03-27 ENCOUNTER — Telehealth: Payer: Self-pay | Admitting: Family Medicine

## 2019-03-27 NOTE — Telephone Encounter (Signed)
Pt called about her sleep schedule, pt stated that she has been up all night and sleeping majority of the day. Please give pt a call back.

## 2019-03-27 NOTE — Telephone Encounter (Signed)
Will forward to MD. Jazmin Hartsell,CMA  

## 2019-04-17 ENCOUNTER — Other Ambulatory Visit: Payer: Self-pay

## 2019-04-17 ENCOUNTER — Ambulatory Visit (INDEPENDENT_AMBULATORY_CARE_PROVIDER_SITE_OTHER): Payer: PPO | Admitting: Family Medicine

## 2019-04-17 ENCOUNTER — Encounter: Payer: Self-pay | Admitting: Family Medicine

## 2019-04-17 VITALS — BP 128/64 | HR 94

## 2019-04-17 DIAGNOSIS — R300 Dysuria: Secondary | ICD-10-CM

## 2019-04-17 MED ORDER — CEPHALEXIN 500 MG PO CAPS: 500.0000 mg | ORAL_CAPSULE | Freq: Three times a day (TID) | ORAL | 0 refills | Status: AC

## 2019-04-17 NOTE — Progress Notes (Signed)
SUBJECTIVE: Amber Allen is a 78 y.o. female who complains of urinary frequency, urgency and dysuria x 2 days, without flank pain, fever, chills, or abnormal vaginal discharge or bleeding.   OBJECTIVE: Appears well, in no apparent distress.  Vital signs are normal. The abdomen is soft without tenderness, guarding, mass, rebound or organomegaly. No CVA tenderness or inguinal adenopathy noted. Urine dipstick shows patient unable to produce specimen due to being on Azo causing red urine unreadable by the machine.     ASSESSMENT: UTI uncomplicated without evidence of pyelonephritis  PLAN:  Will send urine for cultures. Given classic symptoms, will treat for 7 days and follow up on sensitivities.  --Keflex 500 mg tid for 7 days   Marjie Skiff, MD [place], PGY-3

## 2019-04-19 DIAGNOSIS — D1801 Hemangioma of skin and subcutaneous tissue: Secondary | ICD-10-CM | POA: Diagnosis not present

## 2019-04-19 DIAGNOSIS — L570 Actinic keratosis: Secondary | ICD-10-CM | POA: Diagnosis not present

## 2019-04-19 DIAGNOSIS — L814 Other melanin hyperpigmentation: Secondary | ICD-10-CM | POA: Diagnosis not present

## 2019-04-19 DIAGNOSIS — L578 Other skin changes due to chronic exposure to nonionizing radiation: Secondary | ICD-10-CM | POA: Diagnosis not present

## 2019-04-19 DIAGNOSIS — L821 Other seborrheic keratosis: Secondary | ICD-10-CM | POA: Diagnosis not present

## 2019-04-19 DIAGNOSIS — Z85828 Personal history of other malignant neoplasm of skin: Secondary | ICD-10-CM | POA: Diagnosis not present

## 2019-04-19 DIAGNOSIS — L738 Other specified follicular disorders: Secondary | ICD-10-CM | POA: Diagnosis not present

## 2019-04-19 DIAGNOSIS — D225 Melanocytic nevi of trunk: Secondary | ICD-10-CM | POA: Diagnosis not present

## 2019-04-19 DIAGNOSIS — L720 Epidermal cyst: Secondary | ICD-10-CM | POA: Diagnosis not present

## 2019-04-27 ENCOUNTER — Other Ambulatory Visit: Payer: Self-pay

## 2019-04-27 ENCOUNTER — Ambulatory Visit (INDEPENDENT_AMBULATORY_CARE_PROVIDER_SITE_OTHER): Payer: PPO | Admitting: Family Medicine

## 2019-04-27 ENCOUNTER — Telehealth (INDEPENDENT_AMBULATORY_CARE_PROVIDER_SITE_OTHER): Payer: PPO | Admitting: Family Medicine

## 2019-04-27 VITALS — BP 116/80 | HR 103

## 2019-04-27 DIAGNOSIS — R300 Dysuria: Secondary | ICD-10-CM | POA: Diagnosis not present

## 2019-04-27 LAB — POCT URINALYSIS DIP (MANUAL ENTRY)
Bilirubin, UA: NEGATIVE
Glucose, UA: NEGATIVE mg/dL
Ketones, POC UA: NEGATIVE mg/dL
Nitrite, UA: NEGATIVE
Protein Ur, POC: NEGATIVE mg/dL
Spec Grav, UA: <=1.005 — AB (ref 1.010–1.025)
Urobilinogen, UA: 0.2 E.U./dL
pH, UA: 6 (ref 5.0–8.0)

## 2019-04-27 MED ORDER — NITROFURANTOIN MONOHYD MACRO 100 MG PO CAPS: 100.0000 mg | ORAL_CAPSULE | Freq: Two times a day (BID) | ORAL | 0 refills | Status: AC

## 2019-04-27 NOTE — Assessment & Plan Note (Signed)
Patient presents with symptoms of dysuria for the past 24 hours after recent completion of Keflex on Sunday (6/14) for UTI.  UA showed moderate leukocytes with no nitrites and trace blood.  Results combination with patient symptoms consistent urinary tract infection.  Suspect patient initial UTI may have blood been treated with Keflex due to possible resistance.  Culture was not collected at the time.  --Prescribed nitrofurantoin 100 mg twice daily for 5 days --Send urine for culture and sensitivities --We will follow-up on results.

## 2019-04-27 NOTE — Progress Notes (Signed)
   Subjective:    Patient ID: Amber Allen, female    DOB: August 05, 1941, 78 y.o.   MRN: [id number]   CC: Dyuria  HPI: Patient is a 78 year old female presents today complaining of dysuria for the past 24 hours.  Patient reports that she has had some discomfort with urination the past day or so with a sensation of pulling which is consistent usually with urinary tract infection.  Patient recently completed a course of Keflex for UTI.  Will attempt to collect urine at that time due to patient taking Azo and the sample being compromised.  Given symptoms patient was asked to return to clinic to provide samples for culture and analysis.  Patient denies any fevers, chills, nausea, vomiting but is complaining of discomfort in his lower abdomen.  Smoking status reviewed   ROS: all other systems were reviewed and are negative other than in the HPI   Past Medical History:  Diagnosis Date  . Cancer ([place]) 2015   Skin  . Cholecystolithiasis   . De Quervain's tenosynovitis   . De Quervain's tenosynovitis, left 09/09/2012  . Depression   . DIVERTICULITIS, HX OF 06/27/2008  . Diverticulosis   . Hyperlipidemia   . Hypertension   . Thyroid disease   . Unspecified vitamin D deficiency 12/12/2008    Past Surgical History:  Procedure Laterality Date  . APPENDECTOMY  1952  . BREAST BIOPSY  1994   no malignancy  . BREAST EXCISIONAL BIOPSY Left 1994  . TONSILLECTOMY    . VAGINAL HYSTERECTOMY  1974   including cervix    Past medical history, surgical, family, and social history reviewed and updated in the EMR as appropriate.  Objective:  BP 116/80   Pulse (!) 103   SpO2 98%   Vitals and nursing note reviewed  General: NAD, pleasant, able to participate in exam Cardiac: RRR, normal heart sounds, no murmurs. 2+ radial and PT pulses bilaterally Respiratory: CTAB, normal effort, No wheezes, rales or rhonchi Abdomen: soft, nontender, nondistended, no hepatic or splenomegaly, +BS Extremities: no  edema or cyanosis. WWP. Skin: warm and dry, no rashes noted Neuro: alert and oriented x4, no focal deficits Psych: Normal affect and mood   Assessment & Plan:   Dysuria Patient presents with symptoms of dysuria for the past 24 hours after recent completion of Keflex on Sunday (6/14) for UTI.  UA showed moderate leukocytes with no nitrites and trace blood.  Results combination with patient symptoms consistent urinary tract infection.  Suspect patient initial UTI may have blood been treated with Keflex due to possible resistance.  Culture was not collected at the time.  --Prescribed nitrofurantoin 100 mg twice daily for 5 days --Send urine for culture and sensitivities --We will follow-up on results.    Marjie Skiff, MD [place] PGY-3

## 2019-04-27 NOTE — Progress Notes (Signed)
WT- 163LB BP- N/A T- NO FEVER [facility] [address]

## 2019-04-27 NOTE — Assessment & Plan Note (Signed)
Patient with symptoms of dysuria for the past day. Recent completion of Keflex for similar symptoms about a week ago. Were not able to process urine last visit due to patient taking Azo. It is possible that bacteria is resistant to keflex. Ask patient to come to clinic to collect urine and for UA and culture. Patient agree with plan will schedule her to  Access to care this afternoon if able to arrange transportation.

## 2019-04-27 NOTE — Progress Notes (Signed)
Amber Allen

## 2019-04-27 NOTE — Progress Notes (Signed)
[place] Telemedicine Visit  Patient consented to have virtual visit. Method of visit: Telephone  Encounter participants: Patient: Amber Allen - located at Home Provider: Marjie Skiff - located at [facility] Others (if applicable): None  Chief Complaint: Dysuria   HPI: Patient is a 78 yo female who is calling in today complaining of symptoms of dysuria for the past day and "pulling" while urinating. Patient reports these symptoms are similar to her prior UTI. She was recently treated for UTI and completed course of Keflex on Sunday but report that symptoms have returned yesterday. Patient denies nausea, vomiting, fever or chills but reports she is not feeling well.  ROS: per HPI  Pertinent PMHx:  Recent UTI   Exam:  Respiratory: no increase WOB  Assessment/Plan:  Dysuria Patient with symptoms of dysuria for the past day. Recent completion of Keflex for similar symptoms about a week ago. Were not able to process urine last visit due to patient taking Azo. It is possible that bacteria is resistant to keflex. Ask patient to come to clinic to collect urine and for UA and culture. Patient agree with plan will schedule her to  Access to care this afternoon if able to arrange transportation.     Time spent during visit with patient: 5 minutes

## 2019-04-29 LAB — URINE CULTURE

## 2019-05-01 ENCOUNTER — Telehealth: Payer: Self-pay

## 2019-05-01 ENCOUNTER — Other Ambulatory Visit: Payer: Self-pay | Admitting: Family Medicine

## 2019-05-01 DIAGNOSIS — R300 Dysuria: Secondary | ICD-10-CM

## 2019-05-01 MED ORDER — SULFAMETHOXAZOLE-TRIMETHOPRIM 400-80 MG PO TABS: 2.0000 | ORAL_TABLET | Freq: Two times a day (BID) | ORAL | 0 refills | Status: AC

## 2019-05-01 NOTE — Telephone Encounter (Signed)
Patient was called and updated on urine culture results. Will send Bactrim and she will stop nitrofurantoin.  Thanks  Marjie Skiff, MD [place], PGY-3

## 2019-05-01 NOTE — Telephone Encounter (Signed)
Patient calls nurse line stating her UTI has not completley gone away. The culture is back, and the patient is wanting another antibiotic called in. Please advise.

## 2019-05-04 ENCOUNTER — Telehealth (INDEPENDENT_AMBULATORY_CARE_PROVIDER_SITE_OTHER): Payer: PPO | Admitting: Family Medicine

## 2019-05-04 ENCOUNTER — Other Ambulatory Visit: Payer: Self-pay

## 2019-05-04 DIAGNOSIS — R5383 Other fatigue: Secondary | ICD-10-CM

## 2019-05-04 NOTE — Assessment & Plan Note (Signed)
Patient reporting some decreased energy and wanted advice from physician in order to improve energy level.  Discussed with patient for improved sleep hygiene as she is currently sleeping mostly during the day and going to bed very late at night.  Patient will need to reverse this trend.  We also discussed daily activity such as walking either in the morning or in the evening.  Patient will also benefit from group activities.  She currently goes to her Bible study once a week and sees her family once a week as well.  Per patient she is very extroverted and confinement due to [place] has been an issue for her.  Patient seems to be able to perform all ADLs and does not report any depressive symptoms.  She continue to be on Lexapro.  Patient will talk to daughter to see if she can help her joint an age appropriate social media group to enhance social interaction while still observing social distancing given her age and risk factors.  Patient will follow-up in a few weeks if still experiencing decreased energy.

## 2019-05-04 NOTE — Progress Notes (Signed)
[place] Telemedicine Visit  Patient consented to have virtual visit. Method of visit: Telephone  Encounter participants: Patient: Amber Allen - located at Home  Provider: Marjie Skiff - located at [facility] Others (if applicable): None   Chief Complaint: Decrease energy   HPI: Patient is a 78 yo female who is calling in today to discuss decreased energy for the past few weeks. Patient reports that she has had decreased energy and motivation in the past few weeks but she is still able to accomplish all her daily task. She continue to have poor sleep hygiene sleeping during the day and staying up late at night. She does not exercise in any form and lives alone though her daughter lives in town. She does not think she has depression because she has had period in the past with depressive symptoms and this does not feel the same. She continue to be Lexapro and has done well with it for more than 10 years.  ROS: per HPI  Pertinent PMHx: Depression  Exam: Cardiac: no palpitations   Respiratory: No increase WOB Abdominal: No abdominal pain  Assessment/Plan:  Decreased energy Patient reporting some decreased energy and wanted advice from physician in order to improve energy level.  Discussed with patient for improved sleep hygiene as she is currently sleeping mostly during the day and going to bed very late at night.  Patient will need to reverse this trend.  We also discussed daily activity such as walking either in the morning or in the evening.  Patient will also benefit from group activities.  She currently goes to her Bible study once a week and sees her family once a week as well.  Per patient she is very extroverted and confinement due to [place] has been an issue for her.  Patient seems to be able to perform all ADLs and does not report any depressive symptoms.  She continue to be on Lexapro.  Patient will talk to daughter to see if she can help her joint an age  appropriate social media group to enhance social interaction while still observing social distancing given her age and risk factors.  Patient will follow-up in a few weeks if still experiencing decreased energy.    Time spent during visit with patient: 11 minutes

## 2019-05-04 NOTE — Progress Notes (Signed)
WT- 170LB BP- N/A T- N/A [facility] [address]

## 2019-06-15 ENCOUNTER — Other Ambulatory Visit: Payer: Self-pay

## 2019-06-15 DIAGNOSIS — I10 Essential (primary) hypertension: Secondary | ICD-10-CM

## 2019-06-16 MED ORDER — AMLODIPINE BESYLATE 5 MG PO TABS: 5.0000 mg | ORAL_TABLET | Freq: Every day | ORAL | 3 refills | Status: DC

## 2019-06-19 ENCOUNTER — Ambulatory Visit (INDEPENDENT_AMBULATORY_CARE_PROVIDER_SITE_OTHER): Payer: PPO

## 2019-06-19 ENCOUNTER — Other Ambulatory Visit: Payer: Self-pay

## 2019-06-19 VITALS — Ht 66.0 in | Wt 165.0 lb

## 2019-06-19 DIAGNOSIS — Z0000 Encounter for general adult medical examination without abnormal findings: Secondary | ICD-10-CM | POA: Diagnosis not present

## 2019-06-19 NOTE — Progress Notes (Addendum)
Subjective:   Amber Allen is a 78 y.o. female who presents for Medicare Annual (Subsequent) preventive examination.  The patient consented to a virtual visit.  Review of Systems: Defer to PCP   Cardiac Risk Factors include: advanced age (>32men, >45 women)     Objective:     Vitals: Ht 5\' 6"  (1.676 m)   Wt 165 lb (74.8 kg)   BMI 26.63 kg/m   Body mass index is 26.63 kg/m.  Advanced Directives 06/19/2019 04/27/2019 03/10/2019 05/26/2018 04/11/2018 01/27/2018 10/05/2017  Does Patient Have a Medical Advance Directive? Yes No No No No No Yes  Type of Paramedic of [place];Living will - - - - - Press photographer;Living will  Does patient want to make changes to medical advance directive? No - Patient declined - - - - - -  Copy of [place] in Chart? Yes - validated most recent copy scanned in chart (See row information) - - - - - Yes  Would patient like information on creating a medical advance directive? - No - Patient declined No - Patient declined No - Patient declined No - Patient declined No - Patient declined -    Tobacco Social History   Tobacco Use  Smoking Status Never Smoker  Smokeless Tobacco Never Used     Counseling given: Not Answered   Clinical Intake:  Pre-visit preparation completed: Yes  Pain Score: 0-No pain  What is the last grade level you completed in school?: Masters  Interpreter Needed?: No   Past Medical History:  Diagnosis Date  . 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 Cholecystolithiasis   . De Quervain's tenosynovitis   . De Quervain's tenosynovitis, left 09/09/2012  . Depression   . DIVERTICULITIS, HX OF 06/27/2008  . Diverticulosis   . Hyperlipidemia   . Hypertension   . Substance abuse ([place])    Alcohol   . Thyroid disease   . Unspecified vitamin D deficiency 12/12/2008   Past Surgical History:  Procedure Laterality Date  . APPENDECTOMY  1952  . BREAST BIOPSY  1994   no malignancy  .  BREAST EXCISIONAL BIOPSY Left 1994  . TONSILLECTOMY    . VAGINAL HYSTERECTOMY  1974   including cervix   Family History  Problem Relation Age of Onset  . Hepatitis Brother        hep C  . Hearing loss Brother   . Arthritis Brother   . Heart failure Father        MI-65  . Hypertension Father   . Alcoholism Father   . Hyperlipidemia Father   . Heart disease Father   . Alcohol abuse Father   . Stroke Father   . Heart failure Mother        18  . Alcoholism Mother   . Heart disease Mother   . Alcohol abuse Mother    Social History   Socioeconomic History  . Marital status: Divorced    Spouse name: Not on file  . Number of children: 2  . Years of education: 60  . Highest education level: Master's degree (e.g., MA, MS, MEng, MEd, MSW, MBA)  Occupational History  . Occupation: Pharmacist, hospital  Social Needs  . Financial resource strain: Not hard at all  . Food insecurity    Worry: Never true    Inability: Never true  . Transportation needs    Medical: No    Non-medical: No  Tobacco Use  . Smoking  status: Never Smoker  . Smokeless tobacco: Never Used  Substance and Sexual Activity  . Alcohol use: Yes    Alcohol/week: 21.0 standard drinks    Types: 21 Glasses of wine per week  . Drug use: No  . Sexual activity: Not Currently  Lifestyle  . Physical activity    Days per week: 0 days    Minutes per session: 0 min  . Stress: Only a little  Relationships  . Social connections    Talks on phone: More than three times a week    Gets together: More than three times a week    Attends religious service: More than 4 times per year    Active member of club or organization: Yes    Attends meetings of clubs or organizations: More than 4 times per year    Relationship status: Divorced  Other Topics Concern  . Not on file  Social History Narrative   Lives by herself with cat here in [place].    Teaches fall prevention class soon. No recent falls.    Divorced with 2 kids (daughter in  [place])   Not exercising regularly.    Retired from working with [employer]. 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     Outpatient Encounter Medications as of 06/19/2019  Medication Sig  . amLODipine (NORVASC) 5 MG tablet Take 1 tablet (5 mg total) by mouth daily.  Marland Kitchen aspirin EC 81 MG tablet Take 81 mg by mouth daily.  Marland Kitchen levothyroxine (SYNTHROID, LEVOTHROID) 100 MCG tablet Take 1 tablet (100 mcg total) by mouth daily.  . Multiple Vitamin (MULTIVITAMIN WITH MINERALS) TABS tablet Take 1 tablet by mouth daily.  . Multiple Vitamins-Minerals (PRESERVISION AREDS 2 PO) Take 1 capsule by mouth 2 (two) times daily.   . simvastatin (ZOCOR) 40 MG tablet Take 1 tablet (40 mg total) by mouth at bedtime.  Marland Kitchen venlafaxine (EFFEXOR) 100 MG tablet Take 1 tablet (100 mg total) by mouth 2 (two) times daily.  . [DISCONTINUED] ipratropium (ATROVENT) 0.06 % nasal spray Place 2 sprays into both nostrils 4 (four) times daily. Start with 2 sprays twice a day and increase only up to four times daily. (Patient not taking: Reported on 05/26/2018)   No facility-administered encounter medications on file as of 06/19/2019.     Activities of Daily Living In your present state of health, do you have any difficulty performing the following activities: 06/19/2019  Hearing? N  Vision? N  Difficulty concentrating or making decisions? N  Walking or climbing stairs? N  Dressing or bathing? N  Doing errands, shopping? N  Preparing Food and eating ? N  Using the Toilet? N  In the past six months, have you accidently leaked urine? Y  Do you have problems with loss of bowel control? N  Managing your Medications? N  Managing your Finances? N  Housekeeping or managing your Housekeeping? N  Some recent data might be hidden    Patient Care Team: Matilde Haymaker, MD as PCP - General (Family Medicine) Sydnee Levans, MD as Consulting Physician (Dermatology)    Assessment:   This is a routine wellness examination for  [facility].  Exercise Activities and Dietary recommendations Current Exercise Habits: The patient does not participate in regular exercise at present  Goals    . Exercise 3x per week (15 min twice daily) (pt-stated)     Walking    . Weight (lb) < 162 lb (73.5 kg)       Fall Risk Fall Risk  06/19/2019 04/27/2019 03/10/2019 05/26/2018 04/11/2018  Falls in the past year? 1 0 0 Yes No  Number falls in past yr: 0 0 - 1 -  Injury with Fall? 0 - - No -  Risk for fall due to : Impaired balance/gait - - - -  Follow up Education provided - - Falls prevention discussed -   Is the patient's home free of loose throw rugs in walkways, pet beds, electrical cords, etc?   yes      Grab bars in the bathroom? yes      Handrails on the stairs?   yes      Adequate lighting?   yes  Patient rating of health (0-10) scale: 9  Depression Screen PHQ 2/9 Scores 06/19/2019 04/27/2019 03/10/2019 05/26/2018  PHQ - 2 Score 0 0 0 0  PHQ- 9 Score - - - -     Cognitive Function   6CIT Screen 06/19/2019  What Year? 0 points  What month? 0 points  What time? 0 points  Count back from 20 0 points  Months in reverse 0 points  Repeat phrase 0 points  Total Score 0    Immunization History  Administered Date(s) Administered  . Influenza Split 09/09/2012  . Pneumococcal Conjugate-13 01/18/2015  . Pneumococcal Polysaccharide-23 06/29/2007  . Td 01/07/2005  . Tdap 01/18/2015, 05/30/2016  . Zoster 12/07/2008   Screening Tests Health Maintenance  Topic Date Due  . INFLUENZA VACCINE  06/10/2019  . TETANUS/TDAP  05/30/2026  . DEXA SCAN  Completed  . PNA vac Low Risk Adult  Completed    Cancer Screenings: Lung: Low Dose CT Chest recommended if Age 35-80 years, 30 pack-year currently smoking OR have quit w/in 15years. Patient does qualify. Breast:  Up to date on Mammogram? Yes   Up to date of Bone Density/Dexa? Yes Colorectal: 2010- declines to have another one  Additional Screenings: PNA: completed      Plan:   Plan to meet your new PCP in the fall. Think about getting your flu vaccine this year.  Start walking either inside or outside 3x a week for 15 minutes each time.   I have personally reviewed and noted the following in the patient's chart:   . Medical and social history . Use of alcohol, tobacco or illicit drugs  . Current medications and supplements . Functional ability and status . Nutritional status . Physical activity . Advanced directives . List of other physicians . Hospitalizations, surgeries, and ER visits in previous 12 months . Vitals . Screenings to include cognitive, depression, and falls . Referrals and appointments  In addition, I have reviewed and discussed with patient certain preventive protocols, quality metrics, and best practice recommendations. A written personalized care plan for preventive services as well as general preventive health recommendations were provided to patient.  This visit was conducted virtually in the setting of the [place] pandemic.    Dorna Bloom, [place]  06/19/2019   I have reviewed this visit and agree with the documentation.  Matilde Haymaker, MD

## 2019-06-19 NOTE — Patient Instructions (Addendum)
You spoke to Amber Allen, [place] over the phone for your annual wellness visit.  We discussed goals: Goals    . Exercise 3x per week (15 min twice daily) (pt-stated)     Walking    . Weight (lb) < 162 lb (73.5 kg)       We also discussed recommended health maintenance. Please call our office and schedule a visit. As discussed, you are due for you flu vaccine. Consider thinking about getting one this year. Educational handout (mailed.) Schedule an office visit in the fall to meet your new PCP.   Health Maintenance  Topic Date Due  . INFLUENZA VACCINE  06/10/2019  . TETANUS/TDAP  05/30/2026  . DEXA SCAN  Completed  . PNA vac Low Risk Adult  Completed   Start walking either inside or outside 3x a week for 15 minutes each time.   Health Maintenance After Age 37 After age 70, you are at a higher risk for certain long-term diseases and infections as well as injuries from falls. Falls are a major cause of broken bones and head injuries in people who are older than age 36. Getting regular preventive care can help to keep you healthy and well. Preventive care includes getting regular testing and making lifestyle changes as recommended by your health care provider. Talk with your health care provider about:  Which screenings and tests you should have. A screening is a test that checks for a disease when you have no symptoms.  A diet and exercise plan that is right for you. What should I know about screenings and tests to prevent falls? Screening and testing are the best ways to find a health problem early. Early diagnosis and treatment give you the best chance of managing medical conditions that are common after age 34. Certain conditions and lifestyle choices may make you more likely to have a fall. Your health care provider may recommend:  Regular vision checks. Poor vision and conditions such as cataracts can make you more likely to have a fall. If you wear glasses, make sure to get your  prescription updated if your vision changes.  Medicine review. Work with your health care provider to regularly review all of the medicines you are taking, including over-the-counter medicines. Ask your health care provider about any side effects that may make you more likely to have a fall. Tell your health care provider if any medicines that you take make you feel dizzy or sleepy.  Osteoporosis screening. Osteoporosis is a condition that causes the bones to get weaker. This can make the bones weak and cause them to break more easily.  Blood pressure screening. Blood pressure changes and medicines to control blood pressure can make you feel dizzy.  Strength and balance checks. Your health care provider may recommend certain tests to check your strength and balance while standing, walking, or changing positions.  Foot health exam. Foot pain and numbness, as well as not wearing proper footwear, can make you more likely to have a fall.  Depression screening. You may be more likely to have a fall if you have a fear of falling, feel emotionally low, or feel unable to do activities that you used to do.  Alcohol use screening. Using too much alcohol can affect your balance and may make you more likely to have a fall. What actions can I take to lower my risk of falls? General instructions  Talk with your health care provider about your risks for falling. Tell your  health care provider if: ? You fall. Be sure to tell your health care provider about all falls, even ones that seem minor. ? You feel dizzy, sleepy, or off-balance.  Take over-the-counter and prescription medicines only as told by your health care provider. These include any supplements.  Eat a healthy diet and maintain a healthy weight. A healthy diet includes low-fat dairy products, low-fat (lean) meats, and fiber from whole grains, beans, and lots of fruits and vegetables. Home safety  Remove any tripping hazards, such as rugs,  cords, and clutter.  Install safety equipment such as grab bars in bathrooms and safety rails on stairs.  Keep rooms and walkways well-lit. Activity   Follow a regular exercise program to stay fit. This will help you maintain your balance. Ask your health care provider what types of exercise are appropriate for you.  If you need a cane or walker, use it as recommended by your health care provider.  Wear supportive shoes that have nonskid soles. Lifestyle  Do not drink alcohol if your health care provider tells you not to drink.  If you drink alcohol, limit how much you have: ? 0-1 drink a day for women. ? 0-2 drinks a day for men.  Be aware of how much alcohol is in your drink. In the [place] one drink equals one typical bottle of beer (12 oz), one-half glass of wine (5 oz), or one shot of hard liquor (1 oz).  Do not use any products that contain nicotine or tobacco, such as cigarettes and e-cigarettes. If you need help quitting, ask your health care provider. Summary  Having a healthy lifestyle and getting preventive care can help to protect your health and wellness after age 83.  Screening and testing are the best way to find a health problem early and help you avoid having a fall. Early diagnosis and treatment give you the best chance for managing medical conditions that are more common for people who are older than age 10.  Falls are a major cause of broken bones and head injuries in people who are older than age 69. Take precautions to prevent a fall at home.  Work with your health care provider to learn what changes you can make to improve your health and wellness and to prevent falls. This information is not intended to replace advice given to you by your health care provider. Make sure you discuss any questions you have with your health care provider. Document Released: 09/08/2017 Document Revised: 02/16/2019 Document Reviewed: 09/08/2017 Elsevier Patient Education  [place].    Preventing Influenza, Adult Influenza, more commonly known as "the flu," is a viral infection that mainly affects the respiratory tract. The respiratory tract includes structures that help you breathe, such as the lungs, nose, and throat. The flu causes many common cold symptoms, as well as a high fever and body aches. The flu spreads easily from person to person (is contagious). The flu is most common from December through March. This is called flu season.You can catch the flu virus by:  Breathing in droplets from an infected person's cough or sneeze.  Touching something that was recently contaminated with the virus and then touching your mouth, nose, or eyes. What can I do to lower my risk?        You can decrease your risk of getting the flu by:  Getting a flu shot (influenza vaccination) every year. This is the best way to prevent the flu. A flu shot  is recommended for everyone age 70 months and older. ? It is best to get a flu shot in the fall, as soon as it is available. Getting a flu shot during winter or spring instead is still a good idea. Flu season can last into early spring. ? Preventing the flu through vaccination requires getting a new flu shot every year. This is because the flu virus changes slightly (mutates) from one year to the next. Even if a flu shot does not completely protect you from all flu virus mutations, it can reduce the severity of your illness and prevent dangerous complications of the flu. ? If you are pregnant, you can and should get a flu shot. ? If you have had a reaction to the shot in the past or if you are allergic to eggs, check with your health care provider before getting a flu shot. ? Sometimes the vaccine is available as a nasal spray. In some years, the nasal spray has not been as effective against the flu virus. Check with your health care provider if you have questions about this.  Practicing good health habits. This is  especially important during flu season. ? Avoid contact with people who are sick with flu or cold symptoms. ? Wash your hands with soap and water often. If soap and water are not available, use alcohol-based hand sanitizer. ? Avoid touching your hands to your face, especially when you have not washed your hands recently. ? Use a disinfectant to clean surfaces at home and at work that may be contaminated with the flu virus. ? Keep your body's disease-fighting system (immune system) in good shape by eating a healthy diet, drinking plenty of fluids, getting enough sleep, and exercising regularly. If you do get the flu, avoid spreading it to others by:  Staying home until your symptoms have been gone for at least one day.  Covering your mouth and nose when you cough or sneeze.  Avoiding close contact with others, especially babies and elderly people. Why are these changes important? Getting a flu shot and practicing good health habits protects you as well as other people. If you get the flu, your friends, family, and co-workers are also at risk of getting it, because it spreads so easily to others. Each year, about 2 out of every 10 people get the flu. Having the flu can lead to complications, such as pneumonia, ear infection, and sinus infection. The flu also can be deadly, especially for babies, people older than age 46, and people who have serious long-term diseases. How is this treated? Most people recover from the flu by resting at home and drinking plenty of fluids. However, a prescription antiviral medicine may reduce your flu symptoms and may make your flu go away sooner. This medicine must be started within a few days of getting flu symptoms. You can talk with your health care provider about whether you need an antiviral medicine. Antiviral medicine may be prescribed for people who are at risk for more serious flu symptoms. This includes people who:  Are older than age 23.  Are  pregnant.  Have a condition that makes the flu worse or more dangerous. Where to find more information  Centers for Disease Control and Prevention: http://www.smith-bell.org/  [facility].com.ee: azureicus.com  American Academy of Family Physicians: familydoctor.org/familydoctor/en/kids/vaccines/preventing-the-flu.html Contact a health care provider if:  You have influenza and you develop new symptoms.  You have: ? Chest pain. ? Diarrhea. ? A fever.  Your cough gets worse, or  you produce more mucus. Summary  The best way to prevent the flu is to get a flu shot every year in the fall.  Even if you get the flu after you have received the yearly vaccine, your flu may be milder and go away sooner because of your flu shot.  If you get the flu, antiviral medicines that are started with a few days of symptoms may reduce your flu symptoms and may make your flu go away sooner.  You can also help prevent the flu by practicing good health habits. This information is not intended to replace advice given to you by your health care provider. Make sure you discuss any questions you have with your health care provider. Document Released: 11/10/2015 Document Revised: 10/08/2017 Document Reviewed: 07/04/2016 Elsevier Patient Education  [place].  Here is an example of what a healthy plate looks like:    ? Make half your plate fruits and vegetables.     ? Focus on whole fruits.     ? Vary your veggies.  ? Make half your grains whole grains. -     ? Look for the word "whole" at the beginning of the ingredients list    ? Some whole-grain ingredients include whole oats, whole-wheat flour,        whole-grain corn, whole-grain brown rice, and whole rye.  ? Move to low-fat and fat-free milk or yogurt.  ? Vary your protein routine. - Meat, fish, poultry (chicken, [place]), eggs, beans (kidney, pinto), dairy.  ? Drink and eat less sodium, saturated fat, and added sugars.      Our clinic's number is [phone]. Please call with questions or concerns about what we discussed today.

## 2019-06-26 ENCOUNTER — Telehealth: Payer: Self-pay

## 2019-06-26 NOTE — Telephone Encounter (Signed)
Patient calls nurse line stating she has lost her Effexor. Patients stated, "I live in a tiny apartment and I have no idea where the bottle is at." Patient stated she needs ~2weeks worth sent her local pharmacy [facility] in [place] and the regular 3 month prescription sent to her mail order. Please advise.

## 2019-06-27 MED ORDER — VENLAFAXINE HCL 100 MG PO TABS: 100.0000 mg | ORAL_TABLET | Freq: Two times a day (BID) | ORAL | 3 refills | Status: DC

## 2019-06-27 MED ORDER — VENLAFAXINE HCL 100 MG PO TABS: 100.0000 mg | ORAL_TABLET | Freq: Two times a day (BID) | ORAL | 0 refills | Status: DC

## 2019-06-27 NOTE — Telephone Encounter (Signed)
Please let pt know that a two week supply was sent to her pharmacy and a 3 month supply to her pain order supplier.  Matilde Haymaker, MD

## 2019-06-28 NOTE — Telephone Encounter (Signed)
Patient has been informed.

## 2019-07-12 ENCOUNTER — Ambulatory Visit (INDEPENDENT_AMBULATORY_CARE_PROVIDER_SITE_OTHER): Payer: PPO | Admitting: Family Medicine

## 2019-07-12 ENCOUNTER — Other Ambulatory Visit: Payer: Self-pay

## 2019-07-12 VITALS — BP 128/70 | Ht 66.0 in | Wt 167.2 lb

## 2019-07-12 DIAGNOSIS — E039 Hypothyroidism, unspecified: Secondary | ICD-10-CM | POA: Diagnosis not present

## 2019-07-12 DIAGNOSIS — E785 Hyperlipidemia, unspecified: Secondary | ICD-10-CM

## 2019-07-12 DIAGNOSIS — G4700 Insomnia, unspecified: Secondary | ICD-10-CM | POA: Insufficient documentation

## 2019-07-12 DIAGNOSIS — F99 Mental disorder, not otherwise specified: Secondary | ICD-10-CM

## 2019-07-12 DIAGNOSIS — IMO0001 Reserved for inherently not codable concepts without codable children: Secondary | ICD-10-CM

## 2019-07-12 DIAGNOSIS — F339 Major depressive disorder, recurrent, unspecified: Secondary | ICD-10-CM

## 2019-07-12 DIAGNOSIS — F5105 Insomnia due to other mental disorder: Secondary | ICD-10-CM

## 2019-07-12 DIAGNOSIS — F1020 Alcohol dependence, uncomplicated: Secondary | ICD-10-CM | POA: Diagnosis not present

## 2019-07-12 MED ORDER — MELATONIN 5 MG PO CAPS: 1.0000 | ORAL_CAPSULE | Freq: Every day | ORAL | 0 refills | Status: DC

## 2019-07-12 NOTE — Assessment & Plan Note (Signed)
Patient with long history of depression.  She is complaining of insomnia where she is staying up for 48 hours and sleeping for 24 hours.  This is happening more and more and occurring about twice weekly now.  This has been slowly worsening since February.  With long history of depression, patient reports not feeling as depressed as previous episodes but does agree that this may be leading to her insomnia. I do want to continue to monitor the patient for progressive symptoms of mania.  She has never been diagnosed with bipolar before but does report thinking that she might be having manic episodes.  Hers are not consistent with typical manic episodes but she does report spending more money on television shopping sites and purchasing things like tears where she knows she will not have anywhere to wear them.  She otherwise appears to have good insight today. Patient will follow-up with me in 2 to 3 weeks to discuss improvement in insomnia or further behaviors concerning for mania.  For now, I believe that her insomnia is likely due to her depression and her lack of wanting to do things throughout the day at times.  Had patient fill PHQ 9 and score is:  -Can consider Wellbutrin though given patient's age, would have to monitor closely.  Can also consider further manifestation of possible bipolar categories.  Would not use Wellbutrin in this case.  If patient's symptoms worsen, I will refer her to psychiatry for further evaluation and management. -Continue sertraline -5 mg melatonin nightly -Improving daily habits

## 2019-07-12 NOTE — Progress Notes (Signed)
Established Patient - Acute Visit Patient ID: MRN [id number], 06-Mar-1941  PCP: Amber Haymaker, MD  Subjective  CC: Insomnia   [id number] Amber Allen is a 78 y.o. female with pmhx s/Amber etoh abuse, hypothyroidism, depression. She presents today with the following problems:  Insomnia  This past February, not sleeping, staying awake all day and going to bed and sleeping all day the next day. Has been happening about once a week. Once a month in March and has been more frequent recently. Tried to make establish a nightly schedule. Turn off electronics at 10 pm, no caffeine after 3pm, sometimes bath but not as much recently due to dry skin and the fact that she does not really get dirty anymore as she spends a lot of her time inside.  Additionally, she had a bad experience falling in the bathtub when she was drinking which led to AAA, etc. she reports.  She also reports that she is not exercising. Patient has a long history of depression. Last depressive episode was 6 years ago. Has been taking effexor 100mg  BID for at least three years. Has been on antidepressant medication since the early 90's. No psych hospitalization and went to psychologist and was treated.  No longer sees anybody for depression.  She describes 1 time that she went to the emergency department and they did not let her leave the hospital until they knew that she had further follow-up. Patient also reports that when she stays up for 48 hours, she likes to plan.  Most recently, she reports doing her budget.  She reports she has a lot more money than she thought she did for spending.  She also reports that she watches the shopping channel online and recently bought herself a whole new wardrobe for losing 16 pounds after quitting drinking.  She does report that she does not have places to wear some of her new close for the occasions.  She notes that she has brought tears as well which she cannot wear at this time.  She also reports that she is  organize her closet a couple different times. Patient has gone back to drinking alcohol on the weekends only.  She reports that she will drink half a bottle on Friday night and half a bottle on Saturday night.  Much to the dismay of her AA friends.  Patient reports that she will not start drinking during the week and feels that she has control of her wine intake. Lives 1.5 miles away from daughter. Both grand daughters at college (Amber Allen). Amber Allen granddaughter tested positive for COVID and she hasn't visited in two weeks.  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  She describes herself as extroverted.  She reports that when she was in marriage counseling, her marriage counselor told her that she was the most extroverted personality on the MMPE that he had seen.  In the past, patient reports that she has been described as passively suicidal: wouldn't go out of her way to save herself.    ROS: Pertinent ROS included in HPI.  History: Medications, allergies, medical history, family history and social history were reviewed and edited as necessary.  Social Hx: Amber Allen reports that she has never smoked. She has never used smokeless tobacco. She reports current alcohol use of about 21.0 standard drinks of alcohol per week. She reports that she does not use drugs.   Objective   Physical Exam:  BP 128/70  Ht 5\' 6"  (1.676 m)   Wt 167 lb 4 oz (75.9 kg)   BMI 26.99 kg/m  Filed Weights   07/12/19 0901  Weight: 167 lb 4 oz (75.9 kg)   Appearance  Clean, no body odor, dressed casually and appropriately.  No unusual jewelry or make-up. Speech  Normal rate of speech, no latency, volume is normal.  Intonations normal.   Behavior No restlessness.  Appropriate eye contact. Cooperativeness Cooperative, friendly Thought Processes Tight, logical, goal directed.  Good insight Thought Content Future oriented, no SI HI AVH.   Good insight and  judgment. Perceptions No hallucinations or delusions  Emotion Mood fine Affect congruent and appropriate Type: Smiling with a good sense of humor.  Has moments of sadness Range: full range, normal  Pertinent Labs & Imaging:  TSH 01/2018: 5    Assessment  Alcoholism /alcohol abuse Patient was in [place].  She continues to talk to a members.  I did not ask her if she was still actively going to [employer].  She reports drinking half a bottle of wine on Friday and have a bottle wine on Saturday but that is it during the week.  Her AA friends have had recent concerns but she reports that she feels that she is in control and will have no issues with further alcohol abuse.  Previously, she reports that she was taking more than a bottle of wine a day.  Depression, recurrent ([place]) Patient with long history of depression.  She is complaining of insomnia where she is staying up for 48 hours and sleeping for 24 hours.  This is happening more and more and occurring about twice weekly now.  This has been slowly worsening since February.  With long history of depression, patient reports not feeling as depressed as previous episodes but does agree that this may be leading to her insomnia. I do want to continue to monitor the patient for progressive symptoms of mania.  She has never been diagnosed with bipolar before but does report thinking that she might be having manic episodes.  Hers are not consistent with typical manic episodes but she does report spending more money on television shopping sites and purchasing things like tears where she knows she will not have anywhere to wear them.  She otherwise appears to have good insight today. Patient will follow-up with me in 2 to 3 weeks to discuss improvement in insomnia or further behaviors concerning for mania.  For now, I believe that her insomnia is likely due to her depression and her lack of wanting to do things throughout the day at times.  Had patient fill PHQ  9 and score is:  -Can consider Wellbutrin though given patient's age, would have to monitor closely.  Can also consider further manifestation of possible bipolar categories.  Would not use Wellbutrin in this case.  If patient's symptoms worsen, I will refer her to psychiatry for further evaluation and management. -Continue sertraline -5 mg melatonin nightly -Improving daily habits   Insomnia Other differential for patient's insomnia includes alcohol, maybe she is not disclosing how much she is actually drinking.  She is currently on levothyroxine for hypothyroidism and has not had a TSH checked in over a year.  Will check TSH today to ensure patient is on right dose of medication.  Can consider this cause for insomnia but less likely.  I think most likely, patient's insomnia is consistent with recurrent depression.  Currently, it is not affecting her daily life tremendously. -  5 mg melatonin -Enforcing better nightly habits -Follow-up TSH  Orders Placed This Encounter  Procedures  . TSH  . Comprehensive metabolic panel  . Lipid Panel      Wilber Oliphant, M.D.  PGY-2  Family Medicine  [phone] 07/12/2019 10:54 AM

## 2019-07-12 NOTE — Assessment & Plan Note (Addendum)
Other differential for patient's insomnia includes alcohol, maybe she is not disclosing how much she is actually drinking.  She is currently on levothyroxine for hypothyroidism and has not had a TSH checked in over a year.  Will check TSH today to ensure patient is on right dose of medication.  Can consider this cause for insomnia but less likely.  I think most likely, patient's insomnia is consistent with recurrent depression.  Currently, it is not affecting her daily life tremendously. -5 mg melatonin -Enforcing better nightly habits -Follow-up TSH

## 2019-07-12 NOTE — Assessment & Plan Note (Addendum)
Patient was in [place].  She continues to talk to a members.  I did not ask her if she was still actively going to [employer].  She reports drinking half a bottle of wine on Friday and have a bottle wine on Saturday but that is it during the week.  Her AA friends have had recent concerns but she reports that she feels that she is in control and will have no issues with further alcohol abuse.  Previously, she reports that she was taking more than a bottle of wine a day.

## 2019-07-12 NOTE — Patient Instructions (Signed)
Dear Amber Allen,   It was good to see you! Thank you for taking your time to come in to be seen. Today, we discussed the following:   Insomnia   Take 5 mg of melatonin at 10 pm every night   Depression   Start with trying to make good habits about self care during the day   Start taking 5 mg melatonin   If this doesn't help, please come back   Please follow up in 2-3 weeks with Dr. Maudie Mercury or sooner for concerning or worsening symptoms.   Be well,   Zettie Cooley, M.D   [facility] [phone]  *Sign up for MyChart for instant access to your health profile, labs, orders, upcoming appointments or to contact your provider with questions*  =================================================================================== Some important items for Amber Allen's health:    Your Blood Pressure.  Should be LESS THAN 140/90 or 150/90 if you are over 65 BP Readings from Last 3 Encounters:  07/12/19 128/70  04/27/19 116/80  04/17/19 128/64    Your Weight History Wt Readings from Last 3 Encounters:  07/12/19 167 lb 4 oz (75.9 kg)  06/19/19 165 lb (74.8 kg)  03/10/19 170 lb (77.1 kg)    Body mass index is 26.99 kg/m.  BMI Classes Classification BMI Category (kg/m2)  Underweight < 18.5  Normal Weight 18.5-24.9  Overweight  25.0-29.9  Obese Class I 30.0-34.9  Obese Class II 35.0-39.9  Obese Class III  > or  = 40.0      Your last A1C     Every 3-6 months if you have diabetes Lab Results  Component Value Date   HGBA1C 5.2 01/17/2016    Your last Cholesterol   Every 1-5 years    Component Value Date/Time   CHOL 199 01/27/2018 0935   HDL 69 01/27/2018 0935   LDLCALC 109 (H) 01/27/2018 0935   LDLDIRECT 114 (H) 05/18/2011 1034    Your last Blood Tests -  Once a year if you take medications    Component Value Date/Time   K 4.6 01/17/2018 1046   CREATININE 0.81 01/17/2018 1046   CREATININE 0.75 01/22/2017 1029   GLUCOSE 92 01/17/2018 1046   GLUCOSE 85  01/22/2017 1029    To Keep You Healthy Your are due for the following Health Maintenance Items:  Health Maintenance Due  Topic Date Due  . INFLUENZA VACCINE  06/10/2019    Please schedule an appointment with your healthcare provider for any questions or concerns regarding your health or any of the items above.   10 LITTLE Things To Do When You're Feeling Too Down To Do Anything  Take a shower. Even if you plan to stay in all day long and not see a soul, take a shower. It takes the most effort to hop in to the shower but once you do, you'll feel immediate results. It will wake you up and you'll be feeling much fresher (and cleaner too).  Brush and floss your teeth. Give your teeth a good brushing with a floss finish. It's a small task but it feels so good and you can check 'taking care of your health' off the list of things to do.  Do something small on your list. Most of [place] have some small thing we would like to get done (load of laundry, sew a button, email a friend). Doing one of these things will make you feel like you've accomplished something.  Drink water. Drinking water is easy right? It's  also really beneficial for your health so keep a glass beside you all day and take sips often. It gives you energy and prevents you from boredom eating.  Do some floor exercises. The last thing you want to do is exercise but it might be just the thing you need the most. Keep it simple and do exercises that involve sitting or laying on the floor. Even the smallest of exercises release chemicals in the brain that make you feel good. Yoga stretches or core exercises are going to make you feel good with minimal effort.  Make your bed. Making your bed takes a few minutes but it's productive and you'll feel relieved when it's done. An unmade bed is a huge visual reminder that you're having an unproductive day. Do it and consider it your housework for the day.  Put on some nice clothes. Take the  sweatpants off even if you don't plan to go anywhere. Put on clothes that make you feel good. Take a look in the mirror so your brain recognizes the sweatpants have been replaced with clothes that make you look great. It's an instant confidence booster.  Wash the dishes. A pile of dirty dishes in the sink is a reflection of your mood. It's possible that if you wash up the dishes, your mood will follow suit. It's worth a try.  Cook a real meal. If you have the luxury to have a "do nothing" day, you have time to make a real meal for yourself. Make a meal that you love to eat. The process is good to get you out of the funk and the food will ensure you have more energy for tomorrow.  Write out your thoughts by hand. When you hand write, you stimulate your brain to focus on the moment that you're in so make yourself comfortable and write whatever comes into your mind. Put those thoughts out on paper so they stop spinning around in your head. Those thoughts might be the very thing holding you down.

## 2019-07-12 NOTE — Progress Notes (Deleted)
  Established Patient - Acute Visit Patient ID: MRN [id number], 1941-03-21  PCP: Matilde Haymaker, MD  Subjective  CC: Insomnia   [id number] Pesch is a 78 y.o. female with pmhx s/f etoh abuse, hypothyroidism, depression. She presents today with the following problems: Insomnia  ***   ROS: Pertinent ROS included in HPI. amLODipine aspirin EC levothyroxine multivitamin with minerals Tabs PRESERVISION AREDS 2 PO simvastatin venlafaxine History: Medications, allergies, medical history, family history and social history were reviewed and edited as necessary.  Social Hx: Lilyanna reports that she has never smoked. She has never used smokeless tobacco. She reports current alcohol use of about 21.0 standard drinks of alcohol per week. She reports that she does not use drugs.   Objective   Physical Exam:  BP 128/70   Ht 5\' 6"  (1.676 m)   Wt 167 lb 4 oz (75.9 kg)   BMI 26.99 kg/m  Filed Weights   07/12/19 0901  Weight: 167 lb 4 oz (75.9 kg)   ***  Pertinent Labs & Imaging:  ***    Assessment  No problem-specific Assessment & Plan notes found for this encounter.  No orders of the defined types were placed in this encounter.     Wilber Oliphant, M.D.  PGY-2  Family Medicine  [phone] 07/12/2019 9:06 AM

## 2019-07-13 LAB — COMPREHENSIVE METABOLIC PANEL
ALT: 14 IU/L (ref 0–32)
AST: 19 IU/L (ref 0–40)
Albumin/Globulin Ratio: 2 (ref 1.2–2.2)
Albumin: 4.4 g/dL (ref 3.7–4.7)
Alkaline Phosphatase: 92 IU/L (ref 39–117)
BUN/Creatinine Ratio: 13 (ref 12–28)
BUN: 12 mg/dL (ref 8–27)
Bilirubin Total: 0.4 mg/dL (ref 0.0–1.2)
CO2: 23 mmol/L (ref 20–29)
Calcium: 9.9 mg/dL (ref 8.7–10.3)
Chloride: 103 mmol/L (ref 96–106)
Creatinine, Ser: 0.9 mg/dL (ref 0.57–1.00)
GFR calc Af Amer: 71 mL/min/{1.73_m2} (ref >59)
GFR calc non Af Amer: 61 mL/min/{1.73_m2} (ref >59)
Globulin, Total: 2.2 g/dL (ref 1.5–4.5)
Glucose: 83 mg/dL (ref 65–99)
Potassium: 4.5 mmol/L (ref 3.5–5.2)
Sodium: 139 mmol/L (ref 134–144)
Total Protein: 6.6 g/dL (ref 6.0–8.5)

## 2019-07-13 LAB — LIPID PANEL
Chol/HDL Ratio: 2.7 ratio (ref 0.0–4.4)
Cholesterol, Total: 201 mg/dL — ABNORMAL HIGH (ref 100–199)
HDL: 74 mg/dL (ref >39)
LDL Chol Calc (NIH): 105 mg/dL — ABNORMAL HIGH (ref 0–99)
Triglycerides: 125 mg/dL (ref 0–149)
VLDL Cholesterol Cal: 22 mg/dL (ref 5–40)

## 2019-07-13 LAB — TSH: TSH: 0.287 u[IU]/mL — ABNORMAL LOW (ref 0.450–4.500)

## 2019-07-24 ENCOUNTER — Telehealth: Payer: Self-pay | Admitting: Family Medicine

## 2019-07-24 NOTE — Addendum Note (Signed)
Addended by: Zettie Cooley E on: 07/24/2019 04:49 PM   Modules accepted: Orders

## 2019-07-24 NOTE — Telephone Encounter (Signed)
Spoke to patient over the phone. She is currently quarantining for COVID for the next week. She has an appt set up on the 23rd where we can get the labs. Patient is agreeable.   Wilber Oliphant, M.D.  PGY-2  Family Medicine  [phone] 07/24/2019 4:52 PM

## 2019-07-30 IMAGING — DX DG CHEST 2V
2 series · 2 of 2 positions shown · non-contrast
Comparison: The 12/28/2008

CLINICAL DATA: Cough

EXAM:
CHEST  2 VIEW

[chest pa]
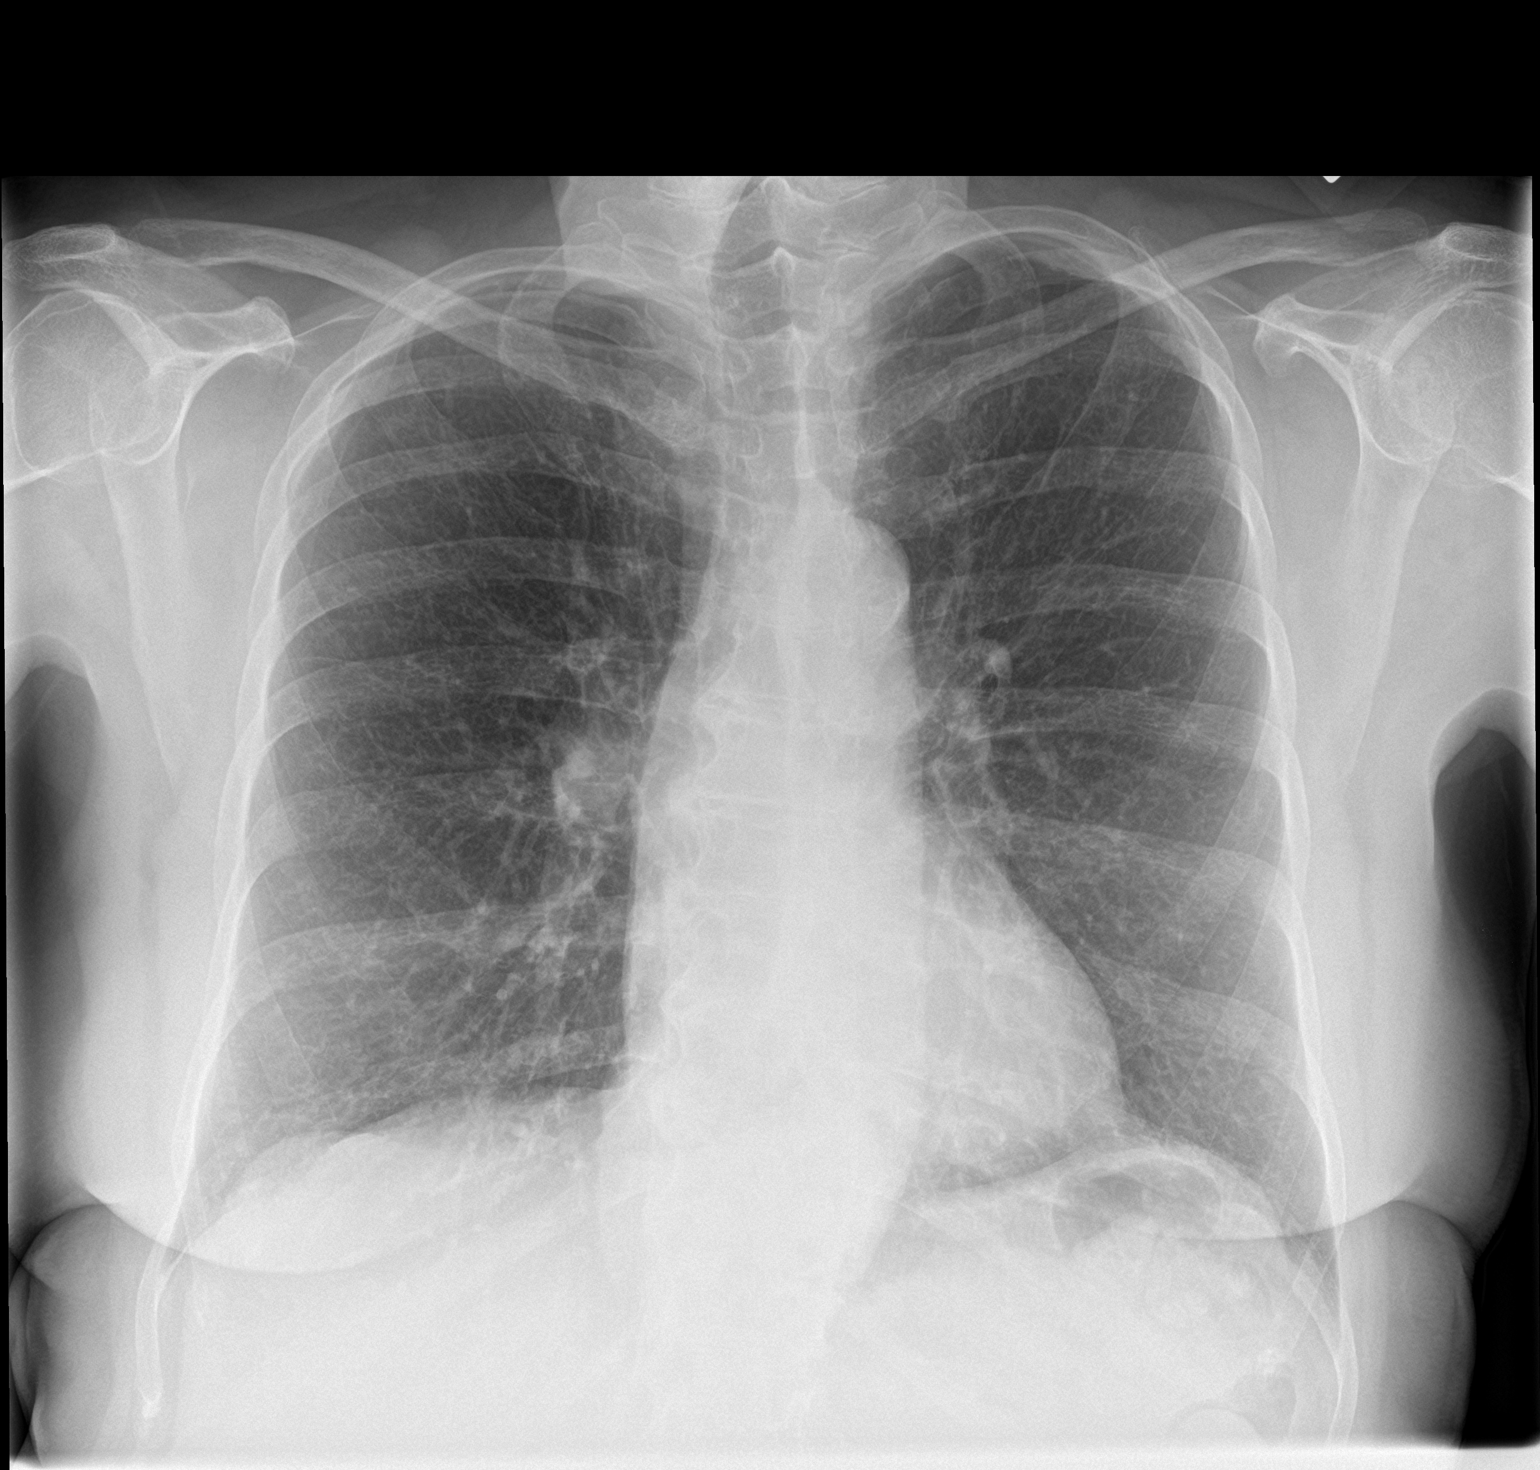

[chest lat]
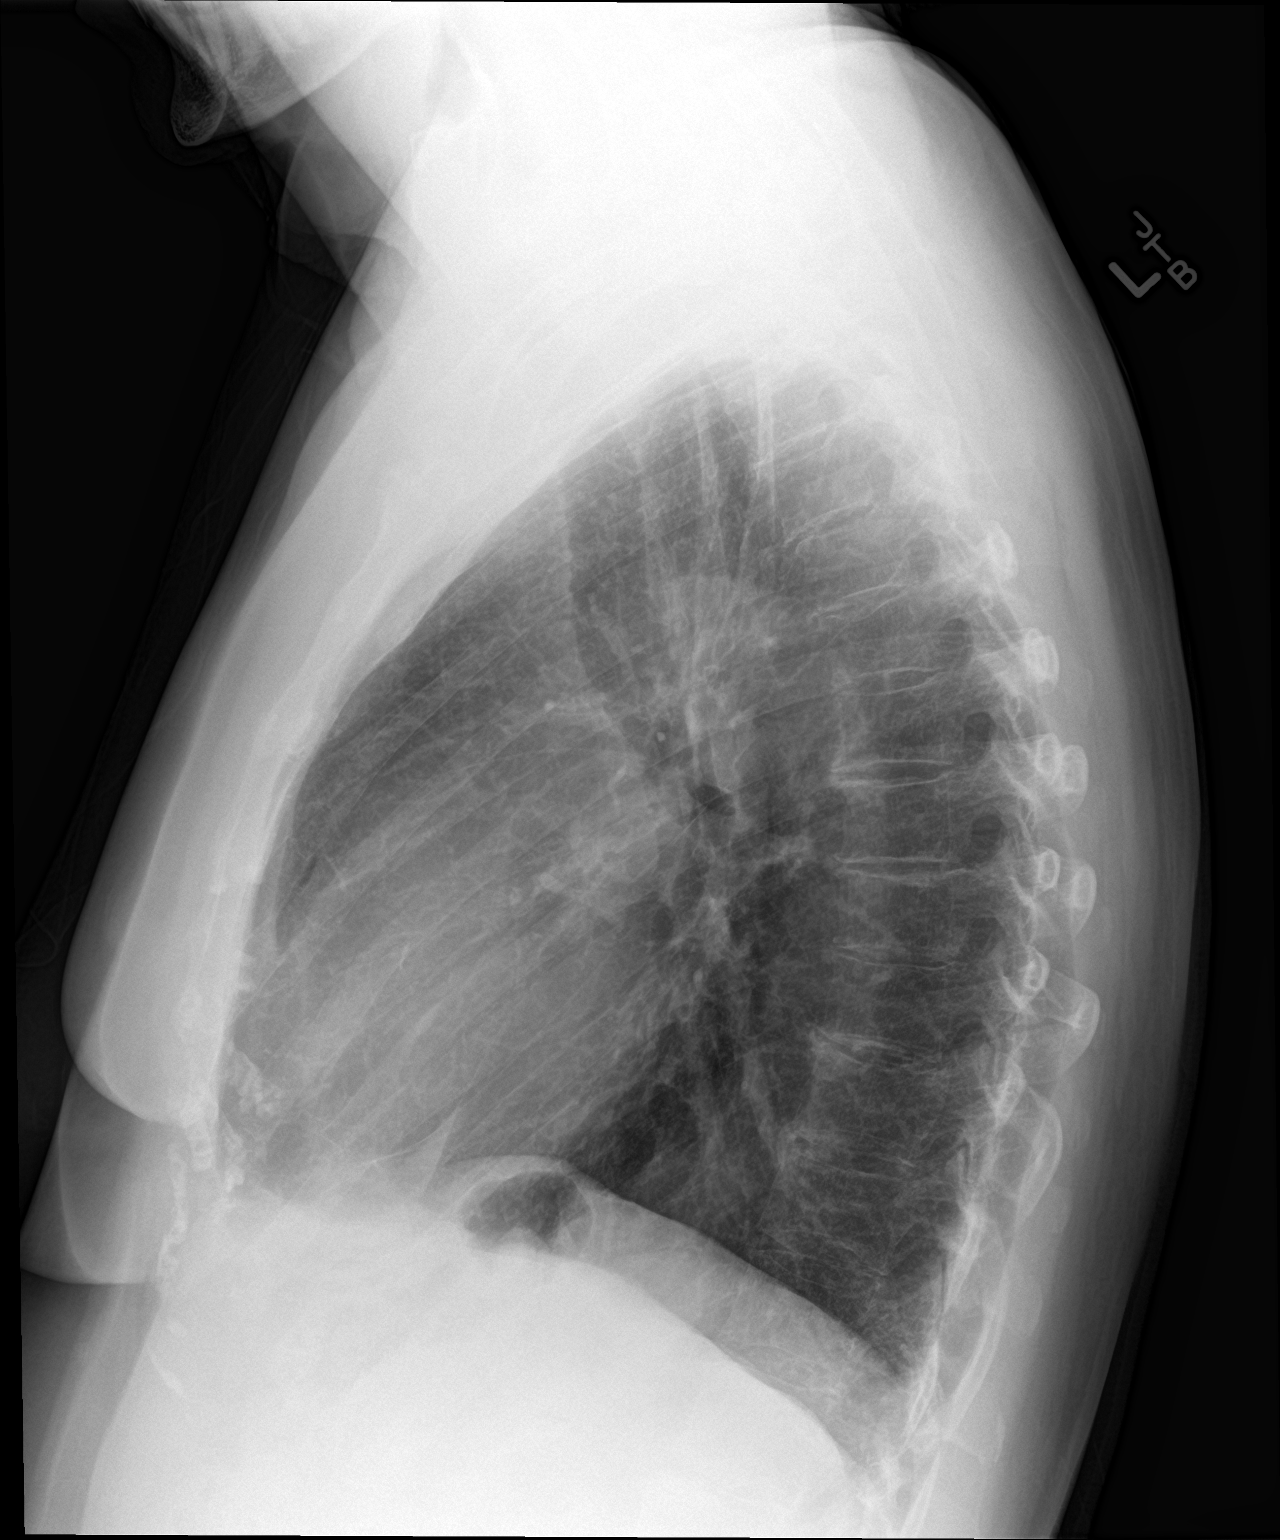

[2 of 2 positions shown; findings below may reference images not displayed]

FINDINGS: Interval clearing of lingular infiltrate. Lungs are now clear
without infiltrate effusion or mass. Negative for heart failure. No
acute skeletal abnormality.
IMPRESSION: No active cardiopulmonary disease.

## 2019-08-02 ENCOUNTER — Encounter: Payer: Self-pay | Admitting: Family Medicine

## 2019-08-02 ENCOUNTER — Other Ambulatory Visit: Payer: Self-pay

## 2019-08-02 ENCOUNTER — Ambulatory Visit (INDEPENDENT_AMBULATORY_CARE_PROVIDER_SITE_OTHER): Payer: PPO | Admitting: Family Medicine

## 2019-08-02 VITALS — BP 124/78 | HR 96 | Wt 168.8 lb

## 2019-08-02 DIAGNOSIS — E039 Hypothyroidism, unspecified: Secondary | ICD-10-CM

## 2019-08-02 DIAGNOSIS — F5105 Insomnia due to other mental disorder: Secondary | ICD-10-CM | POA: Diagnosis not present

## 2019-08-02 DIAGNOSIS — E785 Hyperlipidemia, unspecified: Secondary | ICD-10-CM | POA: Insufficient documentation

## 2019-08-02 DIAGNOSIS — F339 Major depressive disorder, recurrent, unspecified: Secondary | ICD-10-CM

## 2019-08-02 DIAGNOSIS — F99 Mental disorder, not otherwise specified: Secondary | ICD-10-CM | POA: Diagnosis not present

## 2019-08-02 MED ORDER — LEVOTHYROXINE SODIUM 88 MCG PO TABS: 88.0000 ug | ORAL_TABLET | Freq: Every day | ORAL | 3 refills | Status: DC

## 2019-08-02 MED ORDER — ATORVASTATIN CALCIUM 40 MG PO TABS: 40.0000 mg | ORAL_TABLET | Freq: Every day | ORAL | 3 refills | Status: DC

## 2019-08-02 NOTE — Assessment & Plan Note (Addendum)
Patient's TSH returned mildly low. Patient denies heart palpitations, chest pain.  Will decrease levothyroxine to 88 mcg daily. Patient is taking her medication in the morning and unlikely to be causing some of her insomnia symptoms. Will recheck TSH in one month. If normal, recheck in 6-8 months.

## 2019-08-02 NOTE — Assessment & Plan Note (Signed)
Continues to have some decreased motivation during the day, but overall doing well.  No changes in medication at this time.

## 2019-08-02 NOTE — Patient Instructions (Addendum)
Dear Amber Allen,   It was good to see you! Thank you for taking your time to come in to be seen. Today, we discussed the following:   Thyroid medications    Decreasing thyroid medication to 51mcg   We'll check your TSH in a month -- Call the practice to set up a lab appointment in one month.   Cholesterol   Changing your cholesterol medication to Atorvastatin 40 mg   Be well,   Zettie Cooley, M.D   [facility] [phone]  *Sign up for MyChart for instant access to your health profile, labs, orders, upcoming appointments or to contact your provider with questions*  ===================================================================================

## 2019-08-02 NOTE — Progress Notes (Signed)
      Subjective  CC: Follow Up Hypothyroidism  [id number] Amber Allen is a 78 y.o. female who presents today with the following problems:  Insomnia Improving with melatonin. Taking at 10, but not going to bed until 1-2, which is an improvement. Will try to take earlier in the evening.   HLD  Reviewed labs from last visit. Cholesterol and LDL are mildly elevated just above upper limits of normal.  Patient currently is taking simvastatin daily.The patient does not use medications that may worsen dyslipidemias (corticosteroids, progestins, anabolic steroids, diuretics, beta-blockers, amiodarone, cyclosporine, olanzapine). The patient exercises rarely since COVID. Previously used to walk every day. The patient is not known to have coexisting coronary artery disease.   The 10-year ASCVD risk score Mikey Bussing DC Brooke Bonito., et al., 2013) is: 27.7%   Values used to calculate the score:     Age: 35 years     Sex: Female     Is Non-Hispanic African American: No     Diabetic: No     Tobacco smoker: No     Systolic Blood Pressure: A999333 mmHg     Is BP treated: Yes     HDL Cholesterol: 74 mg/dL     Total Cholesterol: 201 mg/dL  Hypothyroid  Labs revealed a low TSH.  Patient was titrated from 88 mcg to 100 mcg in 2018. Current symptoms: change in energy level . Patient denies diarrhea, heat / cold intolerance, nervousness, palpitations and weight changes. Symptoms have been well-controlled.  Pertinent PMFSHx: hypothyroidism, HLD, HTN, depression, ETOH abuse ROS: Pertinent ROS included in HPI.     Objective  Physical Exam:  BP 124/78   Pulse 96   Wt 168 lb 12.8 oz (76.6 kg)   SpO2 98%   BMI 27.25 kg/m  General: Well appearing white female. NAD. Friendly and cooperative.     Assessment & Plan    Problem List Items Addressed This Visit      Unprioritized   Depression, recurrent ([place])    Continues to have some decreased motivation during the day, but overall doing well.  No changes in medication at  this time.      Hyperlipidemia   Relevant Medications   atorvastatin (LIPITOR) 40 MG tablet   Hypothyroidism - Primary (Chronic)    Patient's TSH returned mildly low. Patient denies heart palpitations, chest pain.  Will decrease levothyroxine to 88 mcg daily. Patient is taking her medication in the morning and unlikely to be causing some of her insomnia symptoms. Will recheck TSH in one month.       Relevant Medications   levothyroxine (SYNTHROID) 88 MCG tablet   Other Relevant Orders   TSH   Insomnia    Improving with melatonin 5 mg nightly.  Patient will start to try to take the melatonin earlier to get to sleep at an earlier time.  She denies staying up for several days.  She still feels that she is got decreased energy but I think this can be attributed to her depression. Continue melatonin        Follow-up: No return appointment scheduled. will follow up on TSH.   Wilber Oliphant, M.D.  PGY-2  Family Medicine  [phone] 08/02/2019 11:33 AM

## 2019-08-02 NOTE — Assessment & Plan Note (Signed)
Improving with melatonin 5 mg nightly.  Patient will start to try to take the melatonin earlier to get to sleep at an earlier time.  She denies staying up for several days.  She still feels that she is got decreased energy but I think this can be attributed to her depression. Continue melatonin

## 2020-01-19 ENCOUNTER — Other Ambulatory Visit: Payer: Self-pay

## 2020-01-19 ENCOUNTER — Ambulatory Visit (INDEPENDENT_AMBULATORY_CARE_PROVIDER_SITE_OTHER): Payer: PPO | Admitting: Family Medicine

## 2020-01-19 ENCOUNTER — Encounter: Payer: Self-pay | Admitting: Family Medicine

## 2020-01-19 ENCOUNTER — Ambulatory Visit (HOSPITAL_COMMUNITY)
Admission: RE | Admit: 2020-01-19 | Discharge: 2020-01-19 | Disposition: A | Discharge status: Home / Self Care | Payer: PPO | Source: Ambulatory Visit | Attending: Family Medicine | Admitting: Family Medicine

## 2020-01-19 VITALS — BP 122/78 | HR 97 | Ht 66.0 in | Wt 175.0 lb

## 2020-01-19 DIAGNOSIS — R011 Cardiac murmur, unspecified: Secondary | ICD-10-CM

## 2020-01-19 DIAGNOSIS — IMO0001 Reserved for inherently not codable concepts without codable children: Secondary | ICD-10-CM

## 2020-01-19 DIAGNOSIS — I10 Essential (primary) hypertension: Secondary | ICD-10-CM

## 2020-01-19 DIAGNOSIS — F1020 Alcohol dependence, uncomplicated: Secondary | ICD-10-CM

## 2020-01-19 DIAGNOSIS — E785 Hyperlipidemia, unspecified: Secondary | ICD-10-CM

## 2020-01-19 DIAGNOSIS — F339 Major depressive disorder, recurrent, unspecified: Secondary | ICD-10-CM | POA: Diagnosis not present

## 2020-01-19 DIAGNOSIS — E039 Hypothyroidism, unspecified: Secondary | ICD-10-CM | POA: Diagnosis not present

## 2020-01-19 NOTE — Patient Instructions (Signed)
There is some asymmetry today.  He is a quick summary of the things we talked about:  Hypertension: Your blood pressure looks well controlled today.  Let us stop your amlodipine entirely and recheck your blood pressure with a visit in 1 to 2 months.  I think we can probably stop this medication and make your life a little simpler.  Aspirin: He can stop taking your daily aspirin.  This medication is no longer recommended for prevention of heart attack or stroke.  Alcohol use: None glad they have gotten in touch again with your AA sponsor.  Please reach out if you think this is something that we can help you with these feelings out of control.  Thyroid: I will let you know the results of your thyroid test today.  And let you know if any medication changes are needed.  Heart murmur: I think this is minor.  Please let me know if you are having any symptoms which would include significant lower extremity swelling, shortness of breath with exertion or difficulty breathing while lying down.

## 2020-01-19 NOTE — Assessment & Plan Note (Addendum)
She denies any symptoms of depression today.  PHQ 2 score of 0.  Last depressive episode about 7 years ago.  I am slightly concerned about her increased alcohol use in the past 2 months.  We will continue Effexor for now continue to monitor alcohol use. -Effexor 100 mg twice daily

## 2020-01-19 NOTE — Assessment & Plan Note (Signed)
Follow up TSH

## 2020-01-19 NOTE — Assessment & Plan Note (Signed)
Stable today, effectively off of antihypertensive medication.  Will discontinue amlodipine and follow-up in 1 month to check blood pressure.  If she remains within an appropriate range, will permanently discontinue antihypertensive medication.  Due to her age, she is at risk of adverse effects of hypertension. -Discontinue amlodipine -Follow-up in 1-2 months

## 2020-01-19 NOTE — Assessment & Plan Note (Signed)
Currently connected with her sponsor in [place].  Not currently interested in making any changes to her alcohol use at present. -Continue to monitor

## 2020-01-19 NOTE — Assessment & Plan Note (Signed)
Follow-up lipid panel 

## 2020-01-19 NOTE — Progress Notes (Signed)
SUBJECTIVE:   CHIEF COMPLAINT / HPI:   HTN Amber Allen reports that she has been consistently been taking her amlodipine.  Her last dose of amlodipine was roughly 3 days ago.   Aspirin for primary prevention She has never had a heart attack or stroke and was started on aspirin 81 mg daily for primary prevention.  Has been on this medication for years.  HLD atorvastatin 40 mg daily  EtOH use She is previously struggled with excessive alcohol use and had been sober for several years before her recent relapse.  She reports she started drinking again about 2 months ago.  She currently drinks 2-4 shots of [place] whiskey daily.  Her primary reason for returning to drinking is boredom.  She finds she does not have enough to keep her occupied at home now with the current social situation imposed by Covid.  She denies any symptoms of depression and reports that her previous episodes of depression have presented with oppressive apathy for her own wellbeing which is not a problem at all at the moment.  MDD She denies any depressive symptoms today.  Her last episode of depression was roughly 7 years ago.  She currently takes Effexor 100 mg twice daily.  Though she has difficulty taking some medication, she reports that this is medication she was out of her way to ensure she gets every day.  Hypothyroidism Her levothyroxine was recently reduced due to an elevated TSH.  PERTINENT  PMH / PSH: Alcohol use disorder  OBJECTIVE:   BP 122/78   Pulse 97   Ht 5\' 6"  (1.676 m)   Wt 175 lb (79.4 kg)   SpO2 99%   BMI 28.25 kg/m    General: Alert and cooperative and appears to be in no acute distress HEENT: Neck non-tender without lymphadenopathy, masses or thyromegaly Cardio: Normal S1 and S2, no S3 or S4. Rhythm is regular.  A999333 systolic murmur loudest at lower left sternal border.  Midsystolic click present. Pulm: Clear to auscultation bilaterally, no crackles, wheezing, or diminished breath  sounds. Normal respiratory effort Abdomen: Bowel sounds normal. Abdomen soft and non-tender.  Extremities: No peripheral edema. Warm/ well perfused.  Strong radial pulse. Neuro: Cranial nerves grossly intact  EKG: Regular rate and rhythm with sinus arrhythmia.  No evidence of atrial or ventricular enlargement.  Regular intervals.   ASSESSMENT/PLAN:   Hyperlipidemia -Follow-up lipid panel  Hypertension Stable today, effectively off of antihypertensive medication.  Will discontinue amlodipine and follow-up in 1 month to check blood pressure.  If she remains within an appropriate range, will permanently discontinue antihypertensive medication.  Due to her age, she is at risk of adverse effects of hypertension. -Discontinue amlodipine -Follow-up in 1-2 months  Hypothyroidism -Follow-up TSH  Depression, recurrent ([place]) She denies any symptoms of depression today.  PHQ 2 score of 0.  Last depressive episode about 7 years ago.  I am slightly concerned about her increased alcohol use in the past 2 months.  We will continue Effexor for now continue to monitor alcohol use. -Effexor 100 mg twice daily  Alcoholism /alcohol abuse Currently connected with her sponsor in [place].  Not currently interested in making any changes to her alcohol use at present. -Continue to monitor   Systolic murmur Loudest at the left lower sternal border.  1-2/6 in severity.  Midsystolic click present.  She denies any shortness of breath with exertion, lower extremity swelling or orthopnea.  Murmur most suspicious for aortic stenosis or mitral valve prolapse.  Shared decision making discussed regarding having an echo performed as this is a new murmur.  Ultimately, she is agreeable to have an EKG in the clinic and to follow up if she begins to experience any symptoms.  EKG showed no evidence of structural abnormalities. -Follow-up as needed for symptoms as noted above.     Matilde Haymaker, MD [place]

## 2020-01-19 NOTE — Assessment & Plan Note (Addendum)
Loudest at the left lower sternal border.  1-2/6 in severity.  Midsystolic click present.  She denies any shortness of breath with exertion, lower extremity swelling or orthopnea.  Murmur most suspicious for aortic stenosis or mitral valve prolapse.  Shared decision making discussed regarding having an echo performed as this is a new murmur.  Ultimately, she is agreeable to have an EKG in the clinic and to follow up if she begins to experience any symptoms.  EKG showed no evidence of structural abnormalities. -Follow-up as needed for symptoms as noted above.

## 2020-01-20 LAB — CBC
Hematocrit: 33.7 % — ABNORMAL LOW (ref 34.0–46.6)
Hemoglobin: 11.2 g/dL (ref 11.1–15.9)
MCH: 30.8 pg (ref 26.6–33.0)
MCHC: 33.2 g/dL (ref 31.5–35.7)
MCV: 93 fL (ref 79–97)
Platelets: 387 10*3/uL (ref 150–450)
RBC: 3.64 x10E6/uL — ABNORMAL LOW (ref 3.77–5.28)
RDW: 12.6 % (ref 11.7–15.4)
WBC: 8.8 10*3/uL (ref 3.4–10.8)

## 2020-01-20 LAB — TSH: TSH: 11.6 u[IU]/mL — ABNORMAL HIGH (ref 0.450–4.500)

## 2020-01-25 ENCOUNTER — Other Ambulatory Visit: Payer: Self-pay | Admitting: Family Medicine

## 2020-01-25 MED ORDER — LEVOTHYROXINE SODIUM 100 MCG PO TABS: 100.0000 ug | ORAL_TABLET | Freq: Every day | ORAL | 2 refills | Status: DC

## 2020-01-25 NOTE — Progress Notes (Signed)
Discussed recent labs with Amber Allen.  TSH is elevated indicating her dose of levothyroxine is too low.  We discussed switching her back to 100 mcg daily versus creating a schedule that involved both 88 mcg and 100 mcg doses on different days of the week.  Amber Allen preferred to go back to 100 mcg tablets daily for a time before complicating her regimen.  Her new increased dose is sent to the pharmacy.  She was encouraged to make a follow-up appointment in 6 to 8 weeks.

## 2020-03-18 ENCOUNTER — Other Ambulatory Visit: Payer: Self-pay | Admitting: *Deleted

## 2020-03-19 MED ORDER — AMLODIPINE BESYLATE 5 MG PO TABS: 5.0000 mg | ORAL_TABLET | Freq: Every day | ORAL | 3 refills | Status: DC

## 2020-04-03 ENCOUNTER — Other Ambulatory Visit: Payer: Self-pay | Admitting: Family Medicine

## 2020-04-03 MED ORDER — VENLAFAXINE HCL 100 MG PO TABS: 100.0000 mg | ORAL_TABLET | Freq: Two times a day (BID) | ORAL | 1 refills | Status: DC

## 2020-04-18 DIAGNOSIS — D485 Neoplasm of uncertain behavior of skin: Secondary | ICD-10-CM | POA: Diagnosis not present

## 2020-04-18 DIAGNOSIS — L814 Other melanin hyperpigmentation: Secondary | ICD-10-CM | POA: Diagnosis not present

## 2020-04-18 DIAGNOSIS — D2272 Melanocytic nevi of left lower limb, including hip: Secondary | ICD-10-CM | POA: Diagnosis not present

## 2020-04-18 DIAGNOSIS — D225 Melanocytic nevi of trunk: Secondary | ICD-10-CM | POA: Diagnosis not present

## 2020-04-18 DIAGNOSIS — Z85828 Personal history of other malignant neoplasm of skin: Secondary | ICD-10-CM | POA: Diagnosis not present

## 2020-04-18 DIAGNOSIS — D224 Melanocytic nevi of scalp and neck: Secondary | ICD-10-CM | POA: Diagnosis not present

## 2020-04-18 DIAGNOSIS — L853 Xerosis cutis: Secondary | ICD-10-CM | POA: Diagnosis not present

## 2020-04-18 DIAGNOSIS — D1801 Hemangioma of skin and subcutaneous tissue: Secondary | ICD-10-CM | POA: Diagnosis not present

## 2020-04-18 DIAGNOSIS — L821 Other seborrheic keratosis: Secondary | ICD-10-CM | POA: Diagnosis not present

## 2020-04-18 DIAGNOSIS — L7211 Pilar cyst: Secondary | ICD-10-CM | POA: Diagnosis not present

## 2020-04-18 DIAGNOSIS — L738 Other specified follicular disorders: Secondary | ICD-10-CM | POA: Diagnosis not present

## 2020-04-18 DIAGNOSIS — L570 Actinic keratosis: Secondary | ICD-10-CM | POA: Diagnosis not present

## 2020-05-01 ENCOUNTER — Telehealth: Payer: Self-pay | Admitting: *Deleted

## 2020-05-01 ENCOUNTER — Other Ambulatory Visit: Payer: Self-pay | Admitting: Family Medicine

## 2020-05-01 MED ORDER — ATORVASTATIN CALCIUM 40 MG PO TABS: 40.0000 mg | ORAL_TABLET | Freq: Every day | ORAL | 3 refills | Status: DC

## 2020-05-01 NOTE — Telephone Encounter (Signed)
Pt states that she has had diarrhea x 2 days.  She denies abdominal pain, fever or bloody stools.   Does not want to come in if she does not have to. Advised pepto-bismol and to drink plenty of fluids to avoid dehydration.  Also to have a bland diet as to avoid upsetting her stomach.  She will call back tomorrow to check in.  Red flags given to go to the ED. Christen Bame, CMA

## 2020-05-17 ENCOUNTER — Other Ambulatory Visit: Payer: Self-pay | Admitting: Family Medicine

## 2020-05-19 DIAGNOSIS — R519 Headache, unspecified: Secondary | ICD-10-CM | POA: Diagnosis not present

## 2020-05-28 ENCOUNTER — Encounter: Payer: Self-pay | Admitting: Family Medicine

## 2020-05-28 ENCOUNTER — Other Ambulatory Visit: Payer: Self-pay

## 2020-05-28 ENCOUNTER — Ambulatory Visit (INDEPENDENT_AMBULATORY_CARE_PROVIDER_SITE_OTHER): Payer: Medicare PPO | Admitting: Family Medicine

## 2020-05-28 VITALS — BP 116/80 | HR 84 | Ht 66.0 in | Wt 175.0 lb

## 2020-05-28 DIAGNOSIS — R011 Cardiac murmur, unspecified: Secondary | ICD-10-CM | POA: Diagnosis not present

## 2020-05-28 DIAGNOSIS — Z1159 Encounter for screening for other viral diseases: Secondary | ICD-10-CM

## 2020-05-28 DIAGNOSIS — F339 Major depressive disorder, recurrent, unspecified: Secondary | ICD-10-CM

## 2020-05-28 DIAGNOSIS — I10 Essential (primary) hypertension: Secondary | ICD-10-CM

## 2020-05-28 DIAGNOSIS — E039 Hypothyroidism, unspecified: Secondary | ICD-10-CM

## 2020-05-28 NOTE — Assessment & Plan Note (Signed)
Well-controlled.  No depressive symptoms at this time.  Would like to remain on Effexor 100 mg twice daily. -Follow-up as needed

## 2020-05-28 NOTE — Assessment & Plan Note (Signed)
Medication list updated.  No need for medication at this time.

## 2020-05-28 NOTE — Assessment & Plan Note (Signed)
-  Echo ordered and scheduled -Amber Allen was called and informed of the ordered echo and her scheduled echo time.

## 2020-05-28 NOTE — Assessment & Plan Note (Signed)
-  Follow-up TSH -Continue Synthroid 100 mcg daily

## 2020-05-28 NOTE — Progress Notes (Signed)
    SUBJECTIVE:   CHIEF COMPLAINT / HPI:   Hypothyroidism The dose was increased to 100 mcg every this year.  No current complaints of fatigue or digestive issues.  Heart murmur Noticed at last visit.  Discussed the last visit I decided not to move forward with an echo.  She currently denies trouble keeping up with people her own age (she does not know very many people her own age).  She reports that she is able to stay quite active and walks as many as 7000 steps a day.  She does not noticed shortness of breath inappropriately or have trouble breathing while lying flat.  History of hypertension Was previously on amlodipine 5 which was discontinued at the last visit.  Her blood pressure remains well controlled today.  PERTINENT  PMH / PSH: Systolic heart murmur  OBJECTIVE:   BP 116/80   Pulse 84   Ht 5\' 6"  (1.676 m)   Wt 175 lb (79.4 kg)   SpO2 99%   BMI 28.25 kg/m    General: Alert and cooperative and appears to be in no acute distress HEENT: Neck non-tender without lymphadenopathy, masses or thyromegaly Cardio: Normal S1 and S2, no S3 or S4.  0-7/1 systolic murmur best at right sternal border.  No significant radiation to the carotids.  No significant change with bearing down.  No rubs or gallops. Pulm: Clear to auscultation bilaterally, no crackles, wheezing, or diminished breath sounds. Normal respiratory effort Abdomen: Bowel sounds normal. Abdomen soft and non-tender.  Extremities: 1+ lower extremity edema.  Warm/ well perfused.  Strong radial pulse.  ASSESSMENT/PLAN:   Hypertension Medication list updated.  No need for medication at this time.  Systolic murmur -Echo ordered and scheduled -Ms. Milewski was called and informed of the ordered echo and her scheduled echo time.  Hypothyroidism -Follow-up TSH -Continue Synthroid 100 mcg daily  Depression, recurrent ([place]) Well-controlled.  No depressive symptoms at this time.  Would like to remain on Effexor 100 mg  twice daily. -Follow-up as needed     Matilde Haymaker, MD [place]

## 2020-05-28 NOTE — Patient Instructions (Signed)
Hypothyroidism: I will let you know the results of your thyroid test.  Please continue taking your current thyroid medicine.  Hepatitis C: This is normal screening for everyone.  I will let you know if anything further is needed.  Heart murmur: I would just keep an eye on your exercise tolerance.  If you feel like you start having a lot of trouble walking as much as he used to, should be a great reason to come in for further work-up.

## 2020-05-29 LAB — HEPATITIS C ANTIBODY: Hep C Virus Ab: <0.1 s/co ratio (ref 0.0–0.9)

## 2020-05-29 LAB — TSH: TSH: 3.1 u[IU]/mL (ref 0.450–4.500)

## 2020-05-29 NOTE — Progress Notes (Signed)
All of your testing is normal and healthy.  We do not need to adjust your thyroid medication at this time.  I will let you know the results of your echocardiogram. Matilde Haymaker, MD

## 2020-05-31 ENCOUNTER — Other Ambulatory Visit: Payer: Self-pay

## 2020-05-31 ENCOUNTER — Ambulatory Visit (HOSPITAL_COMMUNITY)
Admission: RE | Admit: 2020-05-31 | Discharge: 2020-05-31 | Disposition: A | Discharge status: Home / Self Care | Payer: Medicare PPO | Source: Ambulatory Visit | Attending: Family Medicine | Admitting: Family Medicine

## 2020-05-31 DIAGNOSIS — E785 Hyperlipidemia, unspecified: Secondary | ICD-10-CM | POA: Diagnosis not present

## 2020-05-31 DIAGNOSIS — R011 Cardiac murmur, unspecified: Secondary | ICD-10-CM | POA: Insufficient documentation

## 2020-05-31 DIAGNOSIS — I10 Essential (primary) hypertension: Secondary | ICD-10-CM | POA: Insufficient documentation

## 2020-05-31 LAB — ECHOCARDIOGRAM COMPLETE
Area-P 1/2: 2.32 cm2
S' Lateral: 2.6 cm

## 2020-05-31 NOTE — Progress Notes (Signed)
Echocardiogram 2D Echocardiogram has been performed.  Oneal Deputy Avanell Banwart 05/31/2020, 11:35 AM

## 2020-05-31 NOTE — Progress Notes (Signed)
I tried to call you but was not able to reach you.  I wanted to let you know that the results of your ultrasound have come in.  There is no concerning cause for that mild murmur noted in clinic.  Is likely from normal wear and tear on your heart and some hardening of your aortic valve.  We do not need to do anything further for this issue at this time.  Please let me know if you have any additional questions. Matilde Haymaker, MD

## 2020-06-05 IMAGING — RF DG ESOPHAGUS
9 of 10 series · 19 of 24 positions shown · non-contrast
Comparison: None.

CLINICAL DATA: Food stuck in throat.  Dysphagia

EXAM:
ESOPHOGRAM/BARIUM SWALLOW
TECHNIQUE: Combined double contrast and single contrast examination performed
using effervescent crystals, thick barium liquid, and thin barium
liquid.
FLUOROSCOPY TIME:  Fluoroscopy Time:  2 minutes 12 seconds
Radiation Exposure Index (if provided by the fluoroscopic device):
32.2 mGy
Number of Acquired Spot Images: 0

[Series 1: cp_standard · 0.34mm/px · 2 of 80 frames shown (1 of 9)]
[frame 4/80]
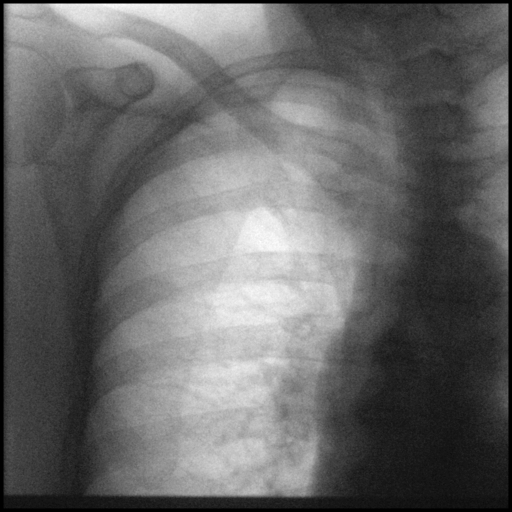
[frame 41/80]
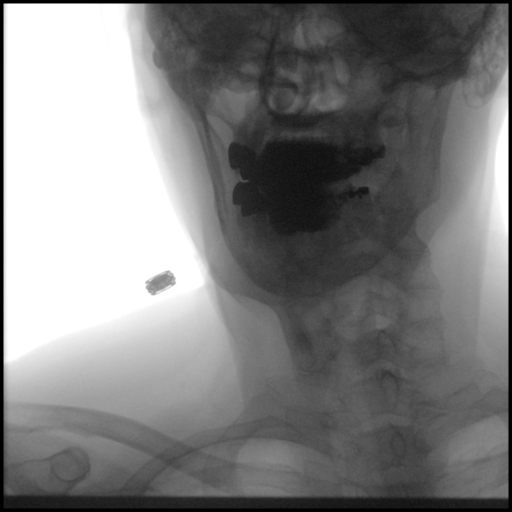

[Series 2: cp_standard · 0.34mm/px · 2 of 71 frames shown (2 of 9)]
[frame 36/71]
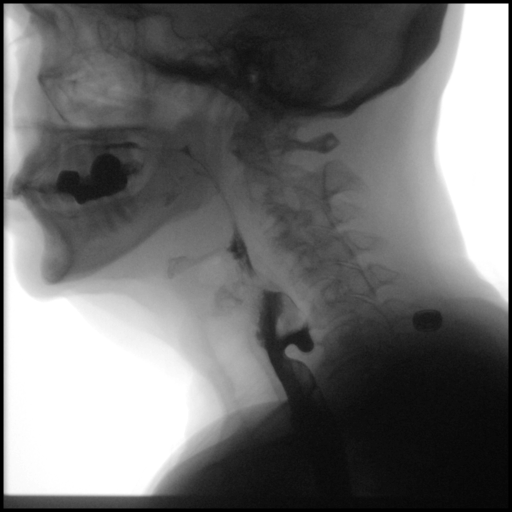
[frame 61/71]
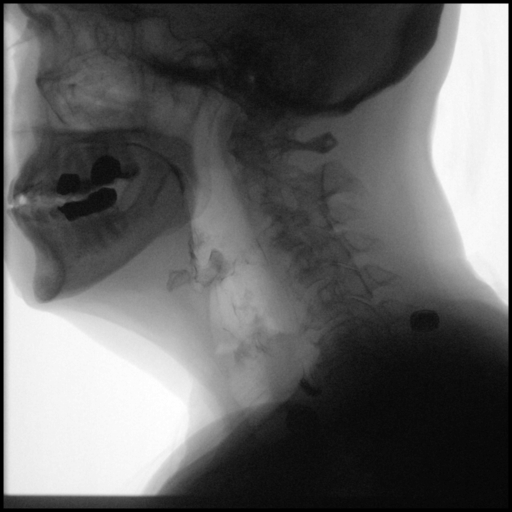

[Series 3: cp_standard · 0.34mm/px · 2 of 382 frames shown (3 of 9)]
[frame 131/382]
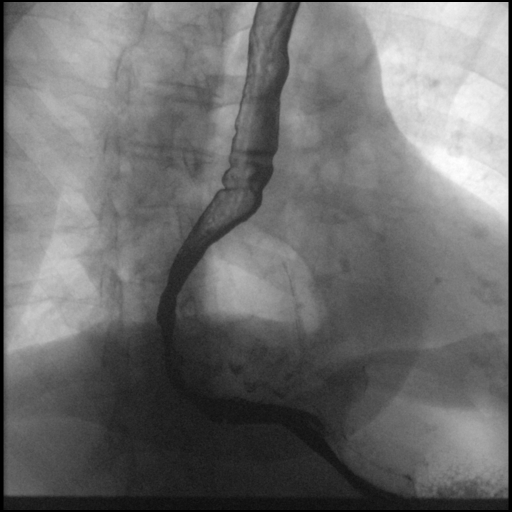
[frame 192/382]
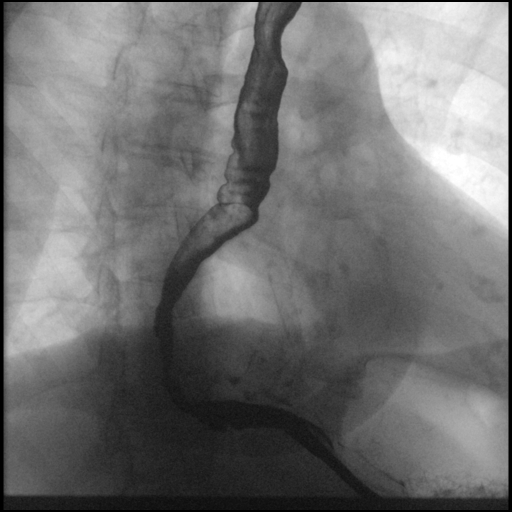

[Series 5: cp_standard · 0.35mm/px · 3 of 160 frames shown (4 of 9)]
[frame 25/160]
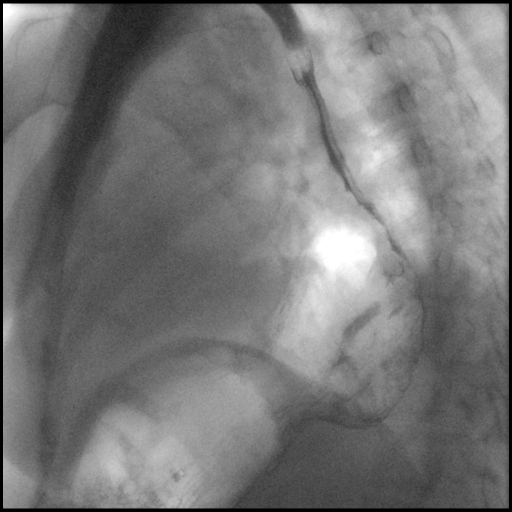
[frame 137/160]
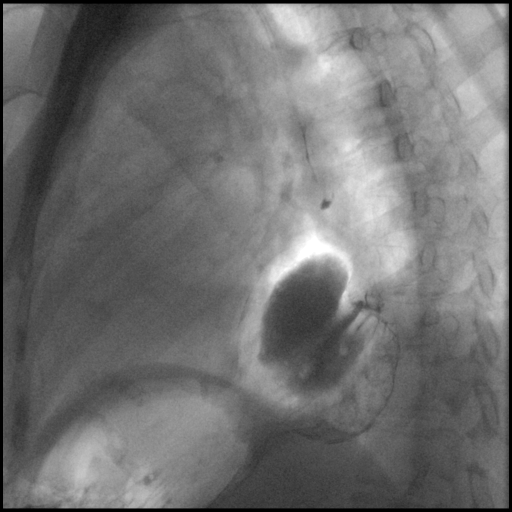
[frame 143/160]
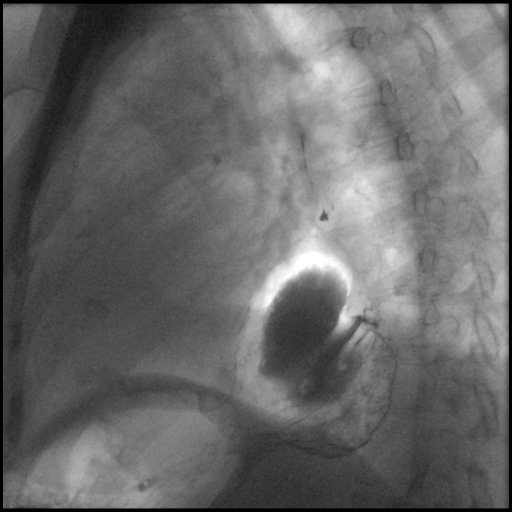

[Series 6: cp_standard · 0.35mm/px · 1 of 87 frames shown (5 of 9)]
[frame 53/87]
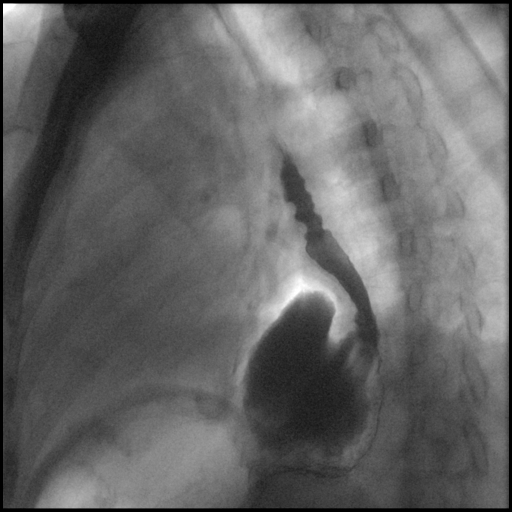

[Series 7: cp_standard · 0.35mm/px · 3 of 78 frames shown (6 of 9)]
[frame 6/78]
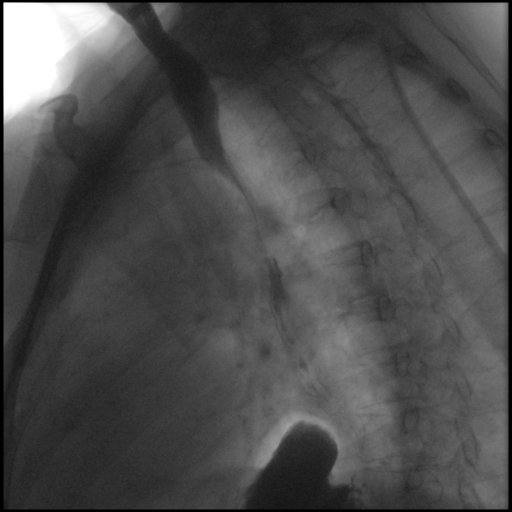
[frame 12/78]
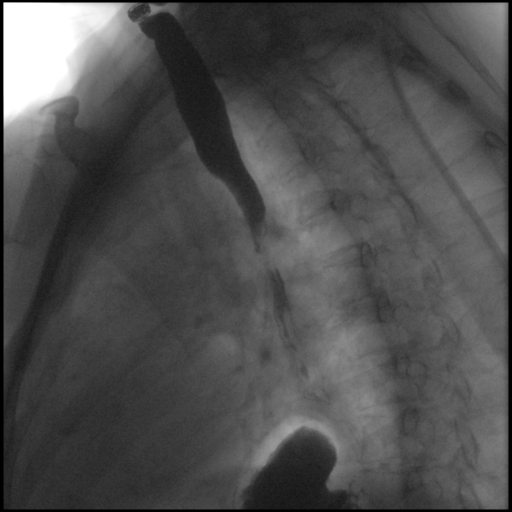
[frame 67/78]
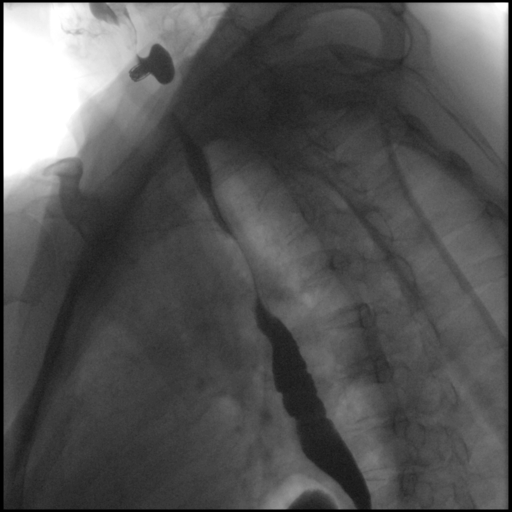

[Series 8: cp_standard · 0.35mm/px · 2 of 342 frames shown (7 of 9)]
[frame 230/342]
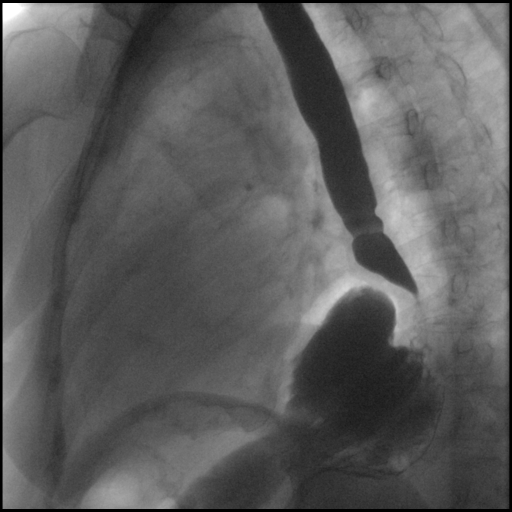
[frame 291/342]
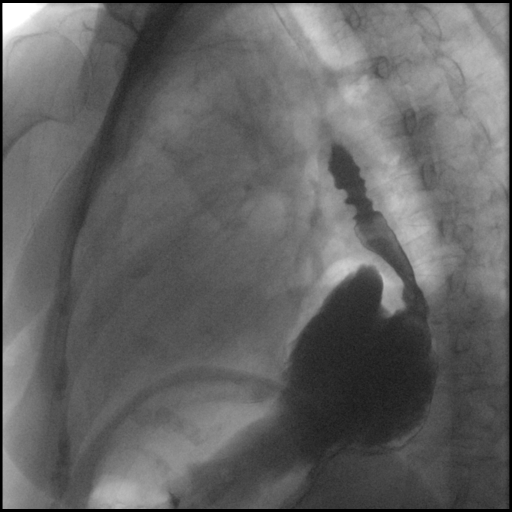

[Series 9: cp_standard · 0.34mm/px · 2 of 85 frames shown (8 of 9)]
[frame 43/85]
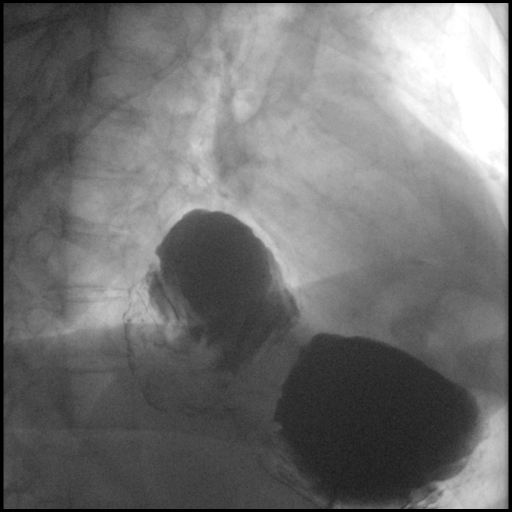
[frame 73/85]
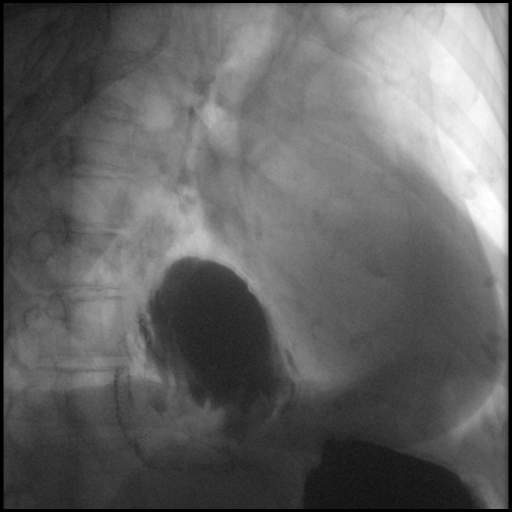

[Series 10: cp_standard · 0.35mm/px · 2 of 71 frames shown (9 of 9)]
[frame 11/71]
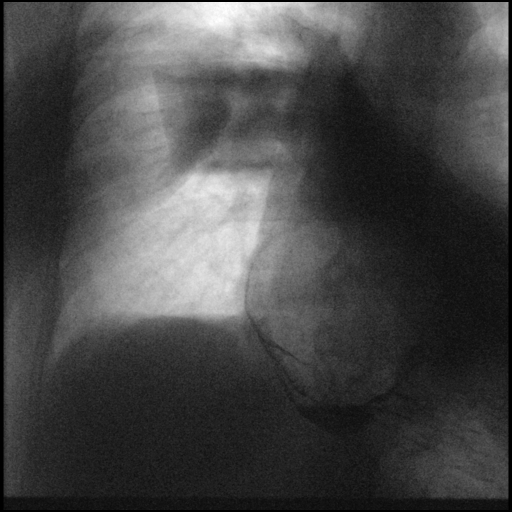
[frame 61/71]
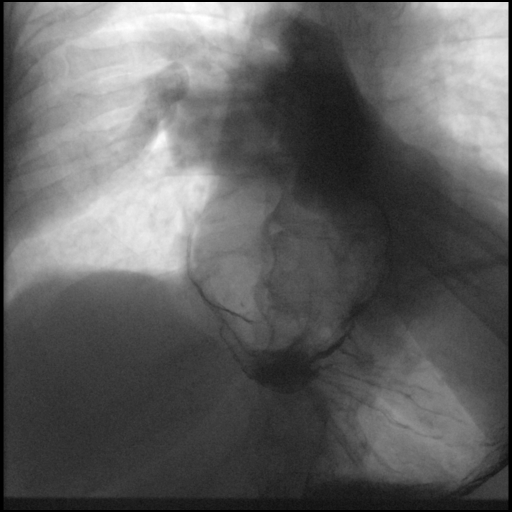

[19 of 24 positions shown; findings below may reference images not displayed]

FINDINGS: Fluoroscopic evaluation of swallowing demonstrates prominent
cricopharyngeus impression in the lower cervical region. Small
diverticulum posteriorly above the cricopharyngeus. Normal
esophageal motility. There is a large hiatal hernia. There is mild
smooth tapering/narrowing in the distal esophagus which could be
related to mass effect from the large hernia. The 13 mm barium
tablet freely passes through the esophagus and into the hiatal
hernia. No reflux with the water siphon maneuver.
IMPRESSION: Large hiatal hernia.

Mild smooth tapering in the distal esophagus could be related to
mass effect. The 13 mm barium tablet freely passes into the hiatal
hernia.

Prominent cricopharyngeus impression. Small esophageal diverticulum
posteriorly just above the cricopharyngeus.

## 2020-06-19 ENCOUNTER — Other Ambulatory Visit: Payer: Self-pay

## 2020-06-20 MED ORDER — LEVOTHYROXINE SODIUM 100 MCG PO TABS: 100.0000 ug | ORAL_TABLET | Freq: Every day | ORAL | 2 refills | Status: DC

## 2020-06-20 MED ORDER — ATORVASTATIN CALCIUM 40 MG PO TABS: 40.0000 mg | ORAL_TABLET | Freq: Every day | ORAL | 3 refills | Status: DC

## 2020-06-20 MED ORDER — VENLAFAXINE HCL 100 MG PO TABS: 100.0000 mg | ORAL_TABLET | Freq: Two times a day (BID) | ORAL | 1 refills | Status: DC

## 2020-12-13 ENCOUNTER — Other Ambulatory Visit: Payer: Self-pay | Admitting: Family Medicine

## 2021-01-17 DIAGNOSIS — R0781 Pleurodynia: Secondary | ICD-10-CM | POA: Diagnosis not present

## 2021-01-30 ENCOUNTER — Ambulatory Visit: Payer: Medicare PPO | Admitting: Family Medicine

## 2021-01-30 ENCOUNTER — Other Ambulatory Visit: Payer: Self-pay

## 2021-01-30 ENCOUNTER — Encounter: Payer: Self-pay | Admitting: Family Medicine

## 2021-01-30 VITALS — BP 136/80 | HR 85 | Ht 66.0 in | Wt 172.0 lb

## 2021-01-30 DIAGNOSIS — F1010 Alcohol abuse, uncomplicated: Secondary | ICD-10-CM | POA: Diagnosis not present

## 2021-01-30 DIAGNOSIS — E039 Hypothyroidism, unspecified: Secondary | ICD-10-CM | POA: Diagnosis not present

## 2021-01-30 DIAGNOSIS — E639 Nutritional deficiency, unspecified: Secondary | ICD-10-CM | POA: Diagnosis not present

## 2021-01-30 DIAGNOSIS — Z9181 History of falling: Secondary | ICD-10-CM | POA: Diagnosis not present

## 2021-01-30 NOTE — Assessment & Plan Note (Signed)
She reports previous workup without notable findings.  No blood thinners, no significant discomfort. -f/u TSH and B12  -declined PT referral

## 2021-01-30 NOTE — Patient Instructions (Signed)
It was great to see you today.  Here is a quick review of the things we talked about:  Hypothyroidism: We are going to check a TSH today.  Alcohol use disorder: I agree with you, I think that you are drinking too much every day.  I think it is a good idea to to use your daughter as an accountability partner.  I am willing to help with medication if that is something you want to try in the future.  Lets follow-up in 1 month and see how you are doing.  Recent fall: We will check some labs today.  If you are interested in physical therapy, simply let me know.  I am happy to put in the order.   If all of your labs are normal, I will send you a message over my chart or send you a letter.  If there is anything to discuss, I will give you a phone call.

## 2021-01-30 NOTE — Assessment & Plan Note (Signed)
This interesting going back.  Not interested in starting medication at this time.  Not interested in width.  This time.  She would like to work with her daughter hasn't category partner to help cut back on her alcohol use.  She was not given any specific alcohol limits. - follow-up in clinic 1 month to assess progress - Consider naltrexone if interested

## 2021-01-30 NOTE — Progress Notes (Signed)
    SUBJECTIVE:   CHIEF COMPLAINT / HPI:   Recent fall Amber Allen noted that she fell at home about 2 weeks ago when she tripped over her cat.  She did not hit her head.  She did fall and hurt her hip and her side.  She reports that she was seen by medical provider after this fall we did a number of x-rays which showed no evidence of fracture.  She does note a bruised rib on her right side which continues to bother her little bit of the pain is well controlled without medication at this time.  She is wary of opioid medication.  Alcohol use disorder Amber Allen has previously struggled with alcohol use disorder and worked with [facility].  She has gone through episodes of worsening improving control with this issue.  She feels that her alcohol use is slowly getting out of control again.  Currently, she drinks between 0.5-1 bottle of wine per day in addition to occasional liquor-containing beverages several times a week.  She is not interested in returning to [facility] at this time.  She has never previously been hospitalized for alcohol use disorder.  Hypothyroidism She is prescribed Synthroid 100 mcg daily.  She reports that she does occasionally miss doses of her medication.  In a given week, she misses as many as 7 nighttime doses of medication.  Social isolation She knows she is extrovert and it is difficult to live alone without any work or socializing activities.  She does not feel depressed herself or others.  She's been speaking with her daughter about getting a [place] activities.  PERTINENT  PMH / PSH: Hypothyroidism, alcohol use disorder.  OBJECTIVE:   BP 136/80   Pulse 85   Ht 5\' 6"  (1.676 m)   Wt 172 lb (78 kg)   SpO2 98%   BMI 27.76 kg/m    General: Alert and cooperative and appears to be in no acute distress Cardio: Normal S1 and S2, no S3 or S4. Rhythm is regular. No murmurs or rubs.   Pulm: Clear to auscultation bilaterally, no crackles, wheezing, or diminished breath  sounds. Normal respiratory effort Abdomen: Bowel sounds normal. Abdomen soft and non-tender.  Extremities: No peripheral edema. Warm/ well perfused.  Strong radial pulses. Neuro: Cranial nerves grossly intact  ASSESSMENT/PLAN:   Alcoholism /alcohol abuse This interesting going back.  Not interested in starting medication at this time.  Not interested in width.  This time.  She would like to work with her daughter hasn't category partner to help cut back on her alcohol use.  She was not given any specific alcohol limits. - follow-up in clinic 1 month to assess progress - Consider naltrexone if interested  Hypothyroidism - f/u TSH in light of frequently missed medications  Personal history of fall She reports previous workup without notable findings.  No blood thinners, no significant discomfort. -f/u TSH and B12  -declined PT referral     Matilde Haymaker, MD [place]

## 2021-01-30 NOTE — Assessment & Plan Note (Signed)
-   f/u TSH in light of frequently missed medications

## 2021-01-31 ENCOUNTER — Other Ambulatory Visit: Payer: Self-pay | Admitting: Family Medicine

## 2021-01-31 LAB — VITAMIN B12: Vitamin B-12: 773 pg/mL (ref 232–1245)

## 2021-01-31 LAB — COMPREHENSIVE METABOLIC PANEL
ALT: 67 IU/L — ABNORMAL HIGH (ref 0–32)
AST: 65 IU/L — ABNORMAL HIGH (ref 0–40)
Albumin/Globulin Ratio: 1.7 (ref 1.2–2.2)
Albumin: 4.4 g/dL (ref 3.7–4.7)
Alkaline Phosphatase: 116 IU/L (ref 44–121)
BUN/Creatinine Ratio: 15 (ref 12–28)
BUN: 12 mg/dL (ref 8–27)
Bilirubin Total: 0.4 mg/dL (ref 0.0–1.2)
CO2: 23 mmol/L (ref 20–29)
Calcium: 9.4 mg/dL (ref 8.7–10.3)
Chloride: 103 mmol/L (ref 96–106)
Creatinine, Ser: 0.82 mg/dL (ref 0.57–1.00)
Globulin, Total: 2.6 g/dL (ref 1.5–4.5)
Glucose: 88 mg/dL (ref 65–99)
Potassium: 4.1 mmol/L (ref 3.5–5.2)
Sodium: 141 mmol/L (ref 134–144)
Total Protein: 7 g/dL (ref 6.0–8.5)
eGFR: 73 mL/min/{1.73_m2} (ref >59)

## 2021-01-31 LAB — TSH: TSH: 10.8 u[IU]/mL — ABNORMAL HIGH (ref 0.450–4.500)

## 2021-03-10 ENCOUNTER — Telehealth: Payer: Self-pay

## 2021-03-10 NOTE — Telephone Encounter (Signed)
Patient calls nurse line reporting urinary frequency. Patient reports she went to the bathroom first thing this am as she always does and had normal output. However, she stated she drank a glass of lemonade and 30 minutes later she reports, "I could not stop myself from urinating." Patient stated she was on the toilet, however "I urinated for about 10 seconds and could not hold my urine in." Patient stated I have never felt like that before. Patient denies abdominal pain, flank pain, dysuria, or fever/chills. Patient has an apt with PCP on 5/9. Patient advised to monitor her symptoms and to call me if symptoms persist. Will forward to PCP for further instructions.

## 2021-03-17 ENCOUNTER — Other Ambulatory Visit: Payer: Self-pay

## 2021-03-17 ENCOUNTER — Ambulatory Visit: Payer: Medicare PPO | Admitting: Family Medicine

## 2021-03-17 VITALS — BP 146/97 | HR 81 | Ht 66.0 in | Wt 169.0 lb

## 2021-03-17 DIAGNOSIS — E039 Hypothyroidism, unspecified: Secondary | ICD-10-CM

## 2021-03-17 DIAGNOSIS — I10 Essential (primary) hypertension: Secondary | ICD-10-CM

## 2021-03-17 DIAGNOSIS — F1010 Alcohol abuse, uncomplicated: Secondary | ICD-10-CM

## 2021-03-17 DIAGNOSIS — R748 Abnormal levels of other serum enzymes: Secondary | ICD-10-CM

## 2021-03-17 DIAGNOSIS — F339 Major depressive disorder, recurrent, unspecified: Secondary | ICD-10-CM | POA: Diagnosis not present

## 2021-03-17 NOTE — Assessment & Plan Note (Signed)
She is not currently taking any antihypertensive medication.  Blood pressure for the past several visits has been well controlled.  This may be an aberrant reading.  I would like to recheck again at her follow-up visit in 1 month.

## 2021-03-17 NOTE — Assessment & Plan Note (Signed)
Does not consistently take medication daily.  No SI/HI today.  Not interested in therapy today. -Continue to encourage therapy

## 2021-03-17 NOTE — Progress Notes (Signed)
    SUBJECTIVE:   CHIEF COMPLAINT / HPI:   Alcohol use disorder She reports that she is currently drinking 3 days a week and drinks about 2 drinks per day.  She currently only drinks 1.  This is significant improvement from her last visit.  She is previously also tried drinking Fish farm manager and other liquor which she has not currently drinking.  She speaks openly with her daughter about this issue and feels that her drinking is currently well controlled.  She does not experience significant cravings and does not interested in additional medications at this time.  Elevated blood pressure She reports a little bit of increased stress today as she did pass an accident today on the way to the clinic.  Mood disorder -Venlafaxine 100 mg She reports that she takes this most days although not every day.  She reports that her mood feels good although she does feel decreased motivation to get up and move around and take her pills daily.  She does not experience any SI/HI.  Hypothyroidism -Levothyroxine 100 mcg She has not been consistently taking her medication daily.  This varies daily based on her appetite and mood.  PERTINENT  PMH / PSH: Hypothyroidism, alcohol use disorder  OBJECTIVE:   BP (!) 146/97   Pulse 81   Ht 5\' 6"  (1.676 m)   Wt 169 lb (76.7 kg)   SpO2 97%   BMI 27.28 kg/m    General: Alert and cooperative and appears to be in no acute distress Cardio: Normal S1 and S2, no S3 or S4. Rhythm is regular. No murmurs or rubs.   Pulm: Clear to auscultation bilaterally, no crackles, wheezing, or diminished breath sounds. Normal respiratory effort Extremities: No peripheral edema. Warm/ well perfused.  Strong radial pulses. Neuro: Cranial nerves grossly intact  ASSESSMENT/PLAN:   Hypertension She is not currently taking any antihypertensive medication.  Blood pressure for the past several visits has been well controlled.  This may be an aberrant reading.  I would like to recheck  again at her follow-up visit in 1 month.  Hypothyroidism Not currently taking her medication consistently.  Additional blood work today would not be fruitful in evaluating her TSH.  She was encouraged to take her medication consistently. -Recheck TSH in 1 month  Alcohol use disorder, mild, in controlled environment She currently lives independently.  Drinks 6 drinks per week.  Does not experience significant cravings.  She is not interested in naltrexone at this time.  She does have a recent history of falls.  She feels that her drinking is well controlled at this time and her daughter is actively involved in this issue. -CMP for assessment of liver enzymes -We will order right upper quadrant ultrasound if liver enzymes remain persistently elevated  Depression, recurrent (HCC) Does not consistently take medication daily.  No SI/HI today.  Not interested in therapy today. -Continue to encourage therapy     Matilde Haymaker, MD [place]

## 2021-03-17 NOTE — Assessment & Plan Note (Signed)
Not currently taking her medication consistently.  Additional blood work today would not be fruitful in evaluating her TSH.  She was encouraged to take her medication consistently. -Recheck TSH in 1 month

## 2021-03-17 NOTE — Patient Instructions (Signed)
Alcohol use disorder: Good job cutting back on your alcohol.  We will recheck your liver today to make sure your liver enzymes have gone back to normal.  Elevated blood pressure: We will recheck your blood pressure today and recheck it again when you return to clinic in 1 month.  Mood: I think it would be very reasonable for you to consider seeing a therapist.  I will keep bugging you about this when I see you he is it may help with better medication adherence and some of the difficulties of living alone.  Please return to clinic in 1 month.

## 2021-03-17 NOTE — Assessment & Plan Note (Signed)
She currently lives independently.  Drinks 6 drinks per week.  Does not experience significant cravings.  She is not interested in naltrexone at this time.  She does have a recent history of falls.  She feels that her drinking is well controlled at this time and her daughter is actively involved in this issue. -CMP for assessment of liver enzymes -We will order right upper quadrant ultrasound if liver enzymes remain persistently elevated

## 2021-03-18 LAB — COMPREHENSIVE METABOLIC PANEL
ALT: 35 IU/L — ABNORMAL HIGH (ref 0–32)
AST: 40 IU/L (ref 0–40)
Albumin/Globulin Ratio: 1.7 (ref 1.2–2.2)
Albumin: 4.2 g/dL (ref 3.7–4.7)
Alkaline Phosphatase: 100 IU/L (ref 44–121)
BUN/Creatinine Ratio: 15 (ref 12–28)
BUN: 12 mg/dL (ref 8–27)
Bilirubin Total: 0.5 mg/dL (ref 0.0–1.2)
CO2: 23 mmol/L (ref 20–29)
Calcium: 9.5 mg/dL (ref 8.7–10.3)
Chloride: 104 mmol/L (ref 96–106)
Creatinine, Ser: 0.78 mg/dL (ref 0.57–1.00)
Globulin, Total: 2.5 g/dL (ref 1.5–4.5)
Glucose: 82 mg/dL (ref 65–99)
Potassium: 4.8 mmol/L (ref 3.5–5.2)
Sodium: 143 mmol/L (ref 134–144)
Total Protein: 6.7 g/dL (ref 6.0–8.5)
eGFR: 77 mL/min/{1.73_m2} (ref >59)

## 2021-04-16 ENCOUNTER — Encounter: Payer: Self-pay | Admitting: Family Medicine

## 2021-04-16 ENCOUNTER — Ambulatory Visit: Payer: Medicare PPO | Admitting: Family Medicine

## 2021-04-16 ENCOUNTER — Ambulatory Visit (INDEPENDENT_AMBULATORY_CARE_PROVIDER_SITE_OTHER): Payer: Medicare PPO

## 2021-04-16 ENCOUNTER — Other Ambulatory Visit: Payer: Self-pay

## 2021-04-16 VITALS — BP 140/98 | HR 78 | Wt 168.2 lb

## 2021-04-16 DIAGNOSIS — Z23 Encounter for immunization: Secondary | ICD-10-CM

## 2021-04-16 DIAGNOSIS — F1010 Alcohol abuse, uncomplicated: Secondary | ICD-10-CM

## 2021-04-16 DIAGNOSIS — I10 Essential (primary) hypertension: Secondary | ICD-10-CM

## 2021-04-16 DIAGNOSIS — F339 Major depressive disorder, recurrent, unspecified: Secondary | ICD-10-CM | POA: Diagnosis not present

## 2021-04-16 DIAGNOSIS — E039 Hypothyroidism, unspecified: Secondary | ICD-10-CM | POA: Diagnosis not present

## 2021-04-16 DIAGNOSIS — E785 Hyperlipidemia, unspecified: Secondary | ICD-10-CM | POA: Diagnosis not present

## 2021-04-16 MED ORDER — NALTREXONE HCL 50 MG PO TABS: 50.0000 mg | ORAL_TABLET | Freq: Every day | ORAL | 1 refills | Status: DC

## 2021-04-16 NOTE — Progress Notes (Signed)
    SUBJECTIVE:   CHIEF COMPLAINT / HPI:   Alcohol use disorder Since her last visit, she reports that she has increased her alcohol use.  She drinks roughly 6 days/week and notes that she finishes most of a bottle of wine each day.  This is an increase from her last visit when she was drinking about 1-2 alcoholic beverages per day.  She recognizes that this is not a healthy lifestyle and she is interested in cutting back on food she is not interested in abstinence at this time.  She is previously been involved in [place] and tried alcohol cessation but does not want that for now.  Hypothyroidism She is currently having difficulty taking her medications daily.  She has significant apathy is noted in the problem below.  She misses her medication 2-3 days/week  MDD PHQ-9 score of 0 today.  She feels that she has mild symptoms of depression which mainly manifest as significant apathy related to her medications.  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  These help her with socialization but she would like additional help.  She is interested in connecting with a therapist.  PERTINENT  PMH / [place]: Hypothyroidism, depression, alcohol use disorder, hyperlipidemia  OBJECTIVE:   BP (!) 140/98   Pulse 78   Wt 168 lb 4 oz (76.3 kg)   SpO2 97%   BMI 27.16 kg/m    General: Alert and cooperative and appears to be in no acute distress Cardio: Normal S1 and S2, no S3 or S4. Rhythm is regular. No murmurs or rubs.   Pulm: Clear to auscultation bilaterally, no crackles, wheezing, or diminished breath sounds. Normal respiratory effort Abdomen: Abdomen soft and non-tender.  Extremities: No peripheral edema. Warm/ well perfused.  Strong radial pulses. Neuro: Cranial nerves grossly intact  ASSESSMENT/PLAN:   Hypertension Not currently on medication.  Elevated to 140/98 today.  I do not think we need to start medication based on her elevated  diastolic pressures and her elevated systolic pressures are only mildly elevated.  Due to her history of recent falls (over a month ago at this point) I am hesitant to reduce her systolic pressures more.  A systolic pressure of 127 is reasonable for this patient at this time.  Hypothyroidism Inconsistently taking meds. -Follow-up TSH  Depression, recurrent (HCC) PHQ-9 of 0 today.  She is interested in additional therapy resources. -Continue venlafaxine 100 mg daily -Therapy resources provided -Follow-up in 1 month  Alcohol use disorder, mild, in controlled environment Worsen daily drinking compared to her last visit.  Her daughter was here for moral support today.  She was interested in a trial of naltrexone daily to see if this helps with craving and reducing her drinking. -Start naltrexone 50 mg daily -Follow-up with therapy as noted under the depression problem -Follow-up in 1 month to assess tolerance of naltrexone and consider titration versus cessation  Hyperlipidemia Inconsistently taking daily medication. -Follow-up lipid panel   Health maintenance Interested in fourth COVID vaccination. -Vaccine administered in clinic today  Matilde Haymaker, MD [place]

## 2021-04-16 NOTE — Assessment & Plan Note (Signed)
Inconsistently taking meds. -Follow-up TSH

## 2021-04-16 NOTE — Assessment & Plan Note (Signed)
Worsen daily drinking compared to her last visit.  Her daughter was here for moral support today.  She was interested in a trial of naltrexone daily to see if this helps with craving and reducing her drinking. -Start naltrexone 50 mg daily -Follow-up with therapy as noted under the depression problem -Follow-up in 1 month to assess tolerance of naltrexone and consider titration versus cessation

## 2021-04-16 NOTE — Patient Instructions (Signed)
It was great to see you today.  Here is a quick review of the things we talked about:  Alcohol use disorder: I think your goal of decreasing your alcohol use is a good goal.  Today, we will start a medication called naltrexone.  You can take it once daily for the next month and we will see how you are doing.  Depression: I like you to continue taking her depression medication.  I have provided a list of resources in the community.  Please see the psychology today website for information about where therapist will be covered.  We are going to get some blood work today to check your cholesterol and thyroid.  If all of your labs are normal, I will send you a message over my chart or send you a letter.  If there is anything to discuss, I will give you a phone call.    Specific Provider options Psychology Today  [email or web address] 1. click on find a therapist  2. enter your zip code 3. left side and select or tailor a therapist for your specific need.    Therapy and Counseling Resources Most providers on this list will take Medicaid. Patients with commercial insurance or Medicare should contact their insurance company to get a list of in network providers.  BestDay:Psychiatry and Counseling [address] [place]. [place], [place] [address] [place], [place], [place] [address]      [place] [address]  [place], [place] [address] [phone]  [place] [address], [place]  [place], [place] [address]       [phone]     [facility] (virtual only) [phone]  [facility] [address], [place], [place]  Family [facility]:  [place]. [place] [phone]  [facility]:  [address] [place] [phone]  [facility] (under & uninsured) [address]   [place] [phone]    [email or web address]    [place] [address] . [place]    [phone]  [facility] [place]     Phone:  [phone]     [place]  [place] [address]      [place], [place] ext [place]: [place], [place], [place]   [place] ([place] therapist) [email or web address]  [place] [address]   [place] [address]    [phone]    [facility]   [address],  [place], [place]   [place]  [place]  [facility]  [address] [place], [place]        [phone]  Open Access/Walk In Clinic under & uninsured  [facility]  [address] [place], [place] Crisis [phone]  [facility] of the [place],  ([place])   [place], [place]: [phone]) 8:30 - 12; 1 - 2:30  Family Service of the [place],  [place], [place]    [phone]):8:30 - 12; 2 - 3PM  [facility] [employer],  [address]  [place]; [phone]):  Mon - Fri 8 AM - 5 PM  [facility] [place]  MWF 12:30 to 3:00 or call to schedule an appointment  [phone]  Specific Provider options Psychology Today  [email or web address] 4. click on find a therapist  5. enter your zip code 6. left side and select or tailor a therapist for your specific need.   [facility] Provider Directory [email or web address]  (Medicaid)   Follow all drop down to find a provider  [place] [phone] or [email or web address] [address],  [place], [place] Recovery support and educational   24- Hour Availability:  .  [place]  . [place], [place] Crisis [phone]  . Family Service of the [employer] [phone]  [facility] Crisis Service  [phone]   . [place]  [phone] (after hours)  . Therapeutic Alternative/Mobile Crisis   [phone]  . [place] National Suicide Hotline  [phone]) 858-3722 ([place])  . Call 911 or go to emergency room  . [employer]  [phone]);  [place] and [employer]   . [facility] ACCESS  [phone]); [address], [place], [place], [place], [place], [place], [place]

## 2021-04-16 NOTE — Assessment & Plan Note (Addendum)
Not currently on medication.  Elevated to 140/98 today.  I do not think we need to start medication based on her elevated diastolic pressures and her elevated systolic pressures are only mildly elevated.  Due to her history of recent falls (over a month ago at this point) I am hesitant to reduce her systolic pressures more.  A systolic pressure of 041 is reasonable for this patient at this time.

## 2021-04-16 NOTE — Assessment & Plan Note (Signed)
Inconsistently taking daily medication. -Follow-up lipid panel

## 2021-04-16 NOTE — Assessment & Plan Note (Signed)
PHQ-9 of 0 today.  She is interested in additional therapy resources. -Continue venlafaxine 100 mg daily -Therapy resources provided -Follow-up in 1 month

## 2021-04-17 LAB — LIPID PANEL
Chol/HDL Ratio: 3.5 ratio (ref 0.0–4.4)
Cholesterol, Total: 273 mg/dL — ABNORMAL HIGH (ref 100–199)
HDL: 79 mg/dL (ref >39)
LDL Chol Calc (NIH): 164 mg/dL — ABNORMAL HIGH (ref 0–99)
Triglycerides: 171 mg/dL — ABNORMAL HIGH (ref 0–149)
VLDL Cholesterol Cal: 30 mg/dL (ref 5–40)

## 2021-04-17 LAB — TSH: TSH: 19.6 u[IU]/mL — ABNORMAL HIGH (ref 0.450–4.500)

## 2021-05-31 ENCOUNTER — Other Ambulatory Visit: Payer: Self-pay | Admitting: Family Medicine

## 2021-08-04 DIAGNOSIS — Z20822 Contact with and (suspected) exposure to covid-19: Secondary | ICD-10-CM | POA: Diagnosis not present

## 2021-08-04 DIAGNOSIS — J069 Acute upper respiratory infection, unspecified: Secondary | ICD-10-CM | POA: Diagnosis not present

## 2021-10-07 DIAGNOSIS — B9719 Other enterovirus as the cause of diseases classified elsewhere: Secondary | ICD-10-CM | POA: Diagnosis not present

## 2021-10-07 DIAGNOSIS — B009 Herpesviral infection, unspecified: Secondary | ICD-10-CM | POA: Diagnosis not present

## 2021-10-07 DIAGNOSIS — Z20822 Contact with and (suspected) exposure to covid-19: Secondary | ICD-10-CM | POA: Diagnosis not present

## 2021-10-17 ENCOUNTER — Emergency Department (HOSPITAL_BASED_OUTPATIENT_CLINIC_OR_DEPARTMENT_OTHER): Payer: Medicare PPO

## 2021-10-17 ENCOUNTER — Emergency Department (HOSPITAL_BASED_OUTPATIENT_CLINIC_OR_DEPARTMENT_OTHER)
Admission: EM | Admit: 2021-10-17 | Discharge: 2021-10-17 | Disposition: A | Discharge status: Home / Self Care | Payer: Medicare PPO | Attending: Student | Admitting: Student

## 2021-10-17 ENCOUNTER — Encounter (HOSPITAL_BASED_OUTPATIENT_CLINIC_OR_DEPARTMENT_OTHER): Payer: Self-pay | Admitting: *Deleted

## 2021-10-17 ENCOUNTER — Other Ambulatory Visit: Payer: Self-pay

## 2021-10-17 DIAGNOSIS — S62522A Displaced fracture of distal phalanx of left thumb, initial encounter for closed fracture: Secondary | ICD-10-CM | POA: Insufficient documentation

## 2021-10-17 DIAGNOSIS — T148XXA Other injury of unspecified body region, initial encounter: Secondary | ICD-10-CM

## 2021-10-17 DIAGNOSIS — S6992XA Unspecified injury of left wrist, hand and finger(s), initial encounter: Secondary | ICD-10-CM | POA: Diagnosis not present

## 2021-10-17 DIAGNOSIS — M2578 Osteophyte, vertebrae: Secondary | ICD-10-CM | POA: Diagnosis not present

## 2021-10-17 DIAGNOSIS — M4312 Spondylolisthesis, cervical region: Secondary | ICD-10-CM | POA: Diagnosis not present

## 2021-10-17 DIAGNOSIS — S62639A Displaced fracture of distal phalanx of unspecified finger, initial encounter for closed fracture: Secondary | ICD-10-CM

## 2021-10-17 DIAGNOSIS — Z85828 Personal history of other malignant neoplasm of skin: Secondary | ICD-10-CM | POA: Diagnosis not present

## 2021-10-17 DIAGNOSIS — M7981 Nontraumatic hematoma of soft tissue: Secondary | ICD-10-CM | POA: Diagnosis not present

## 2021-10-17 DIAGNOSIS — M7989 Other specified soft tissue disorders: Secondary | ICD-10-CM | POA: Diagnosis not present

## 2021-10-17 DIAGNOSIS — E039 Hypothyroidism, unspecified: Secondary | ICD-10-CM | POA: Insufficient documentation

## 2021-10-17 DIAGNOSIS — X58XXXA Exposure to other specified factors, initial encounter: Secondary | ICD-10-CM | POA: Diagnosis not present

## 2021-10-17 DIAGNOSIS — S0990XA Unspecified injury of head, initial encounter: Secondary | ICD-10-CM | POA: Diagnosis not present

## 2021-10-17 DIAGNOSIS — Z79899 Other long term (current) drug therapy: Secondary | ICD-10-CM | POA: Insufficient documentation

## 2021-10-17 DIAGNOSIS — I10 Essential (primary) hypertension: Secondary | ICD-10-CM | POA: Insufficient documentation

## 2021-10-17 DIAGNOSIS — W19XXXA Unspecified fall, initial encounter: Secondary | ICD-10-CM

## 2021-10-17 DIAGNOSIS — S0993XA Unspecified injury of face, initial encounter: Secondary | ICD-10-CM | POA: Diagnosis not present

## 2021-10-17 DIAGNOSIS — S0083XA Contusion of other part of head, initial encounter: Secondary | ICD-10-CM | POA: Insufficient documentation

## 2021-10-17 DIAGNOSIS — R220 Localized swelling, mass and lump, head: Secondary | ICD-10-CM | POA: Diagnosis not present

## 2021-10-17 NOTE — ED Provider Notes (Signed)
[place] [facility] EMERGENCY DEPARTMENT Provider Note   CSN: [id number] Arrival date & time: 10/17/21  1242     History Chief Complaint  Patient presents with   Amber Allen is a 80 y.o. female with PMH alcohol abuse, left de Quervain's tenosynovitis, HLD, HTN who presents the emergency department for evaluation of a head injury and left thumb pain.  Patient states that she had multiple cocktails last night and does not remember falling but awoke this morning with blood on her couch and a black eye.  She arrives with a mild headache and external evidence of a hematoma on the forehead with orbital bruising on the right as well as bruising to the distal tip of the left thumb.  Denies chest pain, shortness of breath, abdominal pain or nausea, vomiting, back pain or other systemic symptoms.  Denies numbness, tingling, weakness or other neurologic complaints.   Fall Associated symptoms include headaches. Pertinent negatives include no chest pain, no abdominal pain and no shortness of breath.      Past Medical History:  Diagnosis Date   Cancer ([place]) 2015   Skin   Cholecystolithiasis    De Quervain's tenosynovitis    De Quervain's tenosynovitis, left 09/09/2012   Depression    DIVERTICULITIS, HX OF 06/27/2008   Diverticulosis    Hyperlipidemia    Hypertension    Substance abuse ([place])    Alcohol    Thyroid disease    Unspecified vitamin D deficiency 12/12/2008    Patient Active Problem List   Diagnosis Date Noted   Personal history of fall 40/98/1191   Systolic murmur 47/82/9562   Hyperlipidemia 08/02/2019   Insomnia 07/12/2019   Decreased energy 05/04/2019   Dysuria 04/27/2019   Dysphagia 05/26/2018   Osteopenia 03/25/2018   Hyperplastic colon polyp 01/29/2017   Diverticulosis of colon 01/22/2017   Hematochezia 06/06/2015   Hypertension 02/18/2013   Alcohol use disorder, mild, in controlled environment 02/18/2013   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     Family History  Problem Relation Age of Onset   Hepatitis Brother        hep C   Hearing loss Brother    Arthritis Brother    Heart failure Father        MI-65   Hypertension Father    Alcoholism Father    Hyperlipidemia Father    Heart disease Father    Alcohol abuse Father    Stroke Father    Heart failure Mother        52   Alcoholism Mother    Heart disease Mother    Alcohol abuse Mother     Social History   Tobacco Use   Smoking status: Never   Smokeless tobacco: Never  Vaping Use   Vaping Use: Never used  Substance Use Topics   Alcohol use: Yes    Alcohol/week: 21.0 standard drinks    Types: 21 Glasses of wine per week   Drug use: No  Home Medications Prior to Admission medications   Medication Sig Start Date End Date Taking? Authorizing Provider  atorvastatin (LIPITOR) 40 MG tablet TAKE 1 TABLET EVERY DAY 06/02/21   Holley Bouche, MD  levothyroxine (SYNTHROID) 100 MCG tablet TAKE 1 TABLET EVERY DAY BEFORE BREAKFAST 12/13/20   Matilde Haymaker, MD  Multiple Vitamin (MULTIVITAMIN WITH MINERALS) TABS tablet Take 1 tablet by mouth daily.    [provider]  Multiple Vitamins-Minerals (PRESERVISION AREDS 2 PO) Take 1 capsule by mouth 2 (two) times daily.     [provider]  naltrexone (DEPADE) 50 MG tablet Take 1 tablet (50 mg total) by mouth daily. 04/16/21   Matilde Haymaker, MD  venlafaxine [facility]) 100 MG tablet TAKE 1 TABLET TWICE DAILY 12/13/20   Matilde Haymaker, MD    Allergies    Patient has no known  allergies.  Review of Systems   Review of Systems  Constitutional:  Negative for chills and fever.  HENT:  Positive for facial swelling. Negative for ear pain and sore throat.   Eyes:  Negative for pain and visual disturbance.  Respiratory:  Negative for cough and shortness of breath.   Cardiovascular:  Negative for chest pain and palpitations.  Gastrointestinal:  Negative for abdominal pain and vomiting.  Genitourinary:  Negative for dysuria and hematuria.  Musculoskeletal:  Positive for arthralgias. Negative for back pain.  Skin:  Negative for color change and rash.  Neurological:  Positive for headaches. Negative for seizures and syncope.  All other systems reviewed and are negative.  Physical Exam Updated Vital Signs BP (!) 173/72 (BP Location: Left Arm)   Pulse 68   Temp 97.9 F (36.6 C) (Oral)   Resp 19   Ht 5\' 6"  (1.676 m)   Wt 76.3 kg   SpO2 95%   BMI 27.15 kg/m   Physical Exam Vitals and nursing note reviewed.  Constitutional:      General: She is not in acute distress.    Appearance: She is well-developed.  HENT:     Head: Normocephalic.     Comments: Right orbital swelling, right forehead hematoma Eyes:     Conjunctiva/sclera: Conjunctivae normal.  Cardiovascular:     Rate and Rhythm: Normal rate and regular rhythm.     Heart sounds: No murmur heard. Pulmonary:     Effort: Pulmonary effort is normal. No respiratory distress.     Breath sounds: Normal breath sounds.  Abdominal:     Palpations: Abdomen is soft.     Tenderness: There is no abdominal tenderness.  Musculoskeletal:        General: Swelling and tenderness (Left thumb) present.     Cervical back: Neck supple.  Skin:    General: Skin is warm and dry.     Capillary Refill: Capillary refill takes less than 2 seconds.     Findings: Bruising (Orbital, left thumb) present.  Neurological:     Mental Status: She is alert.  Psychiatric:        Mood and Affect: Mood normal.    ED Results /  Procedures / Treatments   Labs (all labs ordered are listed, but only abnormal results are displayed) Labs Reviewed - No data to display  EKG None  Radiology CT Head Wo Contrast  Result Date: 10/17/2021 CLINICAL DATA:  Head trauma, minor (Age >= 65y); Neck trauma (Age >= 65y); Facial trauma, blunt EXAM: CT HEAD WITHOUT CONTRAST CT MAXILLOFACIAL WITHOUT CONTRAST CT CERVICAL SPINE WITHOUT CONTRAST TECHNIQUE: Multidetector CT imaging of the head,  cervical spine, and maxillofacial structures were performed using the standard protocol without intravenous contrast. Multiplanar CT image reconstructions of the cervical spine and maxillofacial structures were also generated. COMPARISON:  None. 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 Vascular: No hyperdense vessel or unexpected calcification. Skull: No acute fracture.  Hyperostosis frontalis internus. Other: Right forehead and periorbital soft tissue swelling. CT MAXILLOFACIAL FINDINGS Osseous: No fracture or mandibular dislocation. No destructive process. Orbits: The globes are intact. There is no evidence of intraorbital trauma. Sinuses: The paranasal sinuses are predominantly clear. Soft tissues: There is right periorbital and right forehead soft tissue swelling. CT CERVICAL SPINE FINDINGS Alignment: Straightening of the cervical lordosis with slight reversal centered at C5. Trace degenerative anterolisthesis at C4-C5. Skull base and vertebrae: There is no acute cervical spine fracture. No aggressive osseous lesion. Soft tissues and spinal canal: No prevertebral fluid or swelling. No visible canal hematoma. Disc levels: Multilevel degenerative disc disease, worst at C5-C6 and C6-C7  with posterior disc osteophyte complexes. There is multilevel facet arthropathy bilaterally, most severe on the right at C2-C3 and C3-C4. Upper chest: Negative Other: None IMPRESSION: No acute intracranial abnormality. 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 Mild sequela of chronic small vessel ischemic disease. No acute facial fracture. Right periorbital and forehead soft tissue swelling. No acute cervical spine fracture. Multilevel degenerative disc disease and facet arthropathy. Electronically Signed   By: Maurine Simmering M.D.   On: 10/17/2021 14:22   CT Cervical Spine Wo Contrast  Result Date: 10/17/2021 CLINICAL DATA:  Head trauma, minor (Age >= 65y); Neck trauma (Age >= 65y); Facial trauma, blunt EXAM: CT HEAD WITHOUT CONTRAST CT MAXILLOFACIAL WITHOUT CONTRAST CT CERVICAL SPINE WITHOUT CONTRAST TECHNIQUE: Multidetector CT imaging of the head, cervical spine, and maxillofacial structures were performed using the standard protocol without intravenous contrast. Multiplanar CT image reconstructions of the cervical spine and maxillofacial structures were also generated. COMPARISON:  None. 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 Vascular: No hyperdense vessel or unexpected calcification. Skull: No acute fracture.  Hyperostosis frontalis internus. Other: Right forehead and periorbital soft tissue swelling. CT MAXILLOFACIAL FINDINGS Osseous: No fracture or mandibular dislocation. No destructive process. Orbits: The globes are intact. There is no evidence of intraorbital trauma. Sinuses: The paranasal sinuses are predominantly clear. Soft tissues: There is right periorbital and right forehead soft tissue swelling. CT CERVICAL SPINE  FINDINGS Alignment: Straightening of the cervical lordosis with slight reversal centered at C5. Trace degenerative anterolisthesis at C4-C5. Skull base and vertebrae: There is no acute cervical spine fracture. No aggressive osseous lesion. Soft tissues and spinal canal: No prevertebral fluid or swelling. No visible canal hematoma. Disc levels: Multilevel degenerative disc disease, worst at C5-C6 and C6-C7 with posterior disc osteophyte complexes. There is multilevel facet arthropathy bilaterally, most severe on the right at C2-C3 and C3-C4. Upper chest: Negative Other: None IMPRESSION: No acute intracranial abnormality. 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 Mild sequela of chronic small vessel ischemic disease. No acute facial fracture. Right periorbital and forehead soft tissue swelling. No acute cervical spine fracture. Multilevel degenerative disc disease and facet arthropathy. Electronically  Signed   By: Maurine Simmering M.D.   On: 10/17/2021 14:22   DG Hand Complete Left  Result Date: 10/17/2021 CLINICAL DATA:  LEFT thumb injury EXAM: LEFT HAND - COMPLETE 3+ VIEW COMPARISON:  None. FINDINGS: Cortical discontinuity at the base of the distal phalanx first digit. This is seen on two views. No additional evidence of fracture. IMPRESSION: Concern for fracture of the base of the distal phalanx first digit. Recommend clinical correlation with point tenderness. Electronically Signed   By: Suzy Bouchard M.D.   On: 10/17/2021 14:01   CT Maxillofacial Wo Contrast  Result Date: 10/17/2021 CLINICAL DATA:  Head trauma, minor (Age >= 65y); Neck trauma (Age >= 65y); Facial trauma, blunt EXAM: CT HEAD WITHOUT CONTRAST CT MAXILLOFACIAL WITHOUT CONTRAST CT CERVICAL SPINE WITHOUT CONTRAST TECHNIQUE: Multidetector CT imaging of the head, cervical spine, and maxillofacial structures were performed using the standard protocol without intravenous contrast. Multiplanar CT image reconstructions of the cervical spine and  maxillofacial structures were also generated. COMPARISON:  None. 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 Vascular: No hyperdense vessel or unexpected calcification. Skull: No acute fracture.  Hyperostosis frontalis internus. Other: Right forehead and periorbital soft tissue swelling. CT MAXILLOFACIAL FINDINGS Osseous: No fracture or mandibular dislocation. No destructive process. Orbits: The globes are intact. There is no evidence of intraorbital trauma. Sinuses: The paranasal sinuses are predominantly clear. Soft tissues: There is right periorbital and right forehead soft tissue swelling. CT CERVICAL SPINE FINDINGS Alignment: Straightening of the cervical lordosis with slight reversal centered at C5. Trace degenerative anterolisthesis at C4-C5. Skull base and vertebrae: There is no acute cervical spine fracture. No aggressive osseous lesion. Soft tissues and spinal canal: No prevertebral fluid or swelling. No visible canal hematoma. Disc levels: Multilevel degenerative disc disease, worst at C5-C6 and C6-C7 with posterior disc osteophyte complexes. There is multilevel facet arthropathy bilaterally, most severe on the right at C2-C3 and C3-C4. Upper chest: Negative Other: None IMPRESSION: No acute intracranial abnormality. 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 Mild sequela of chronic small vessel ischemic disease. No acute facial fracture. Right periorbital and forehead soft tissue swelling. No acute cervical spine fracture. Multilevel degenerative disc disease and facet arthropathy. Electronically Signed   By: Maurine Simmering M.D.   On: 10/17/2021 14:22    Procedures Procedures   Medications Ordered in ED Medications - No  data to display  ED Course  I have reviewed the triage vital signs and the nursing notes.  Pertinent labs & imaging results that were available during my care of the patient were reviewed by me and considered in my medical decision making (see chart for details).    MDM Rules/Calculators/A&P                           Patient seen emergency department for evaluation of an apparent fall, thumb pain and facial trauma.  Physical exam reveals periorbital bruising, hematoma over the left forehead, tenderness to the distal tip of the left thumb but is otherwise unremarkable.  June reveals a distal tuft fracture in the thumb on the left, CT of the head and C-spine and face with no fracture or intracranial pathology.  Thumb splinted and patient will follow-up with outpatient orthopedics.  Patient has follow-up with  a primary care physician in 72 hours which will aid in her care.  Patient able to ambulate upon time of discharge and was discharged. Final Clinical Impression(s) / ED Diagnoses Final diagnoses:  Fall, initial encounter  Contusion of face, initial encounter  Hematoma  Closed fracture of tuft of distal phalanx of finger    Rx / DC Orders ED Discharge Orders     None        Nickia Boesen, Debe Coder, MD 10/17/21 1546

## 2021-10-17 NOTE — ED Triage Notes (Signed)
Fall last night. States she had had cocktails and does not remember details of the fall. Abrasions the right side of her face with a black eye. Injury to her left thumb with bruising, pain and swelling noted.

## 2021-10-17 NOTE — ED Notes (Signed)
Patient with abrasion to the right forehead and bruising around right eye. Denies blurred vision or headache, denies being on blood thinners. Main complaint is left thumb pain, it is swollen and red.

## 2021-10-20 ENCOUNTER — Ambulatory Visit: Payer: Medicare PPO | Admitting: Student

## 2021-10-20 ENCOUNTER — Encounter: Payer: Self-pay | Admitting: Student

## 2021-10-20 ENCOUNTER — Other Ambulatory Visit: Payer: Self-pay

## 2021-10-20 VITALS — BP 114/78 | HR 71 | Ht 66.0 in | Wt 169.8 lb

## 2021-10-20 DIAGNOSIS — F1010 Alcohol abuse, uncomplicated: Secondary | ICD-10-CM | POA: Diagnosis not present

## 2021-10-20 DIAGNOSIS — Z9181 History of falling: Secondary | ICD-10-CM | POA: Diagnosis not present

## 2021-10-20 DIAGNOSIS — E039 Hypothyroidism, unspecified: Secondary | ICD-10-CM | POA: Diagnosis not present

## 2021-10-20 DIAGNOSIS — N3941 Urge incontinence: Secondary | ICD-10-CM | POA: Diagnosis not present

## 2021-10-20 DIAGNOSIS — F339 Major depressive disorder, recurrent, unspecified: Secondary | ICD-10-CM

## 2021-10-20 DIAGNOSIS — E7800 Pure hypercholesterolemia, unspecified: Secondary | ICD-10-CM | POA: Diagnosis not present

## 2021-10-20 NOTE — Patient Instructions (Signed)
It was great to see you! Thank you for allowing me to participate in your care!   I recommend that you always bring your medications to each appointment as this makes it easy to ensure we are on the correct medications and helps [place] not miss when refills are needed.  Our plans for today:  - I have contacted - I have listed therapy resources below, please select and call one of these offices! - we can talk about medication for urinary incontinence at future visits after we see how therapy goes! - I have put in a referral for sports medicine  Therapy and Counseling Resources Most providers on this list will take Medicaid. Patients with commercial insurance or Medicare should contact their insurance company to get a list of in network providers.  [facility]:Psychiatry and Counseling [address] [facility] [place]. [place], [place] [address] [place], [place], [place] [address]      [place] [address]  [place], [place] [address] [phone]  [facility] [address], [place]  [place], [place] [address]       [phone]     [facility] (virtual only) [phone]  [facility] [address] [place], [place]  [facility]:  [place]. [place] [phone]  [facility]:  [address] [address] [place] [phone]  [facility] (under & uninsured) [address], [place] [phone]    [email or web address]    [place] [address]  [place]    [phone]  [facility] [place]     Phone:  [phone]     [place]  [place] [address]      [place], [place] ext [place]: [place], [place], [place]   [place] ([place] therapist) [email or web address]  [place] [address]   [place] [address]    [phone]    [facility]   [address],  [place], [place]   [place]  [place]  [facility]  [address] [place], [place]        [phone]  Open Access/Walk In Clinic under & uninsured  [facility]  [address] [place], [place] Crisis [phone]  [facility] [place],  ([place])   [place], [place]: [phone]) 8:30 - 12; 1 - 2:30  [facility] the [place],  [place], [place]    [phone]):8:30 - 12; 2 - 3PM  [facility] [employer],  [address]  [place]; [phone]):   Mon - Fri 8 AM - 5 PM  [facility] [place]  MWF 12:30 to 3:00 or call to schedule an appointment  [phone]  Specific Provider options Psychology Today  [email or web address] click on find a therapist  enter your zip code left side and select or tailor a therapist for your specific need.   [facility] [place] Provider Directory [email or web address]  (Medicaid)   Follow all drop down to find a provider  Social Support program [facility] [place] or [email or web address] [address], [place], [place] Recovery support and educational   24- Hour Availability:   [facility]  [address] [place], [place] Crisis [phone]  [facility] [employer] [phone]  [place]  [phone]   [place]  [phone] (after hours)  Therapeutic Alternative/Mobile Crisis   [phone]  [place] National Suicide Hotline   [phone] Diamantina Monks)  Call 911 or go to emergency room  [facility]  [phone]);  [place] and [employer]  [phone]); [place], [place], [place], [place], [place], [place], [place]    Take care and seek immediate care sooner if you develop any concerns. Please remember to show up 15 minutes before your scheduled appointment time!  Gerrit Heck, MD [place]

## 2021-10-20 NOTE — Progress Notes (Signed)
    SUBJECTIVE:   CHIEF COMPLAINT / HPI: Recent Fall  Recent fall 12/12 Thumb on left with fracture of the base of the distal phalanx and splint in place.  Doesn't remember what happened, and woke up in bed. Daughter lives a 1 mile and a half away from her. She says she drank 2 grand marnier on the rocks that night. Fell at 2:35 in morning and texted her daughter but somehow ended up back in bed although she lives alone. 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  Chronic infarct on head imaging in ED thought she had a stroke like symptoms 7 or 8 years ago.   Depression Denies SI/HI. Has had a hard time taking her medications and says she is unsure why she has difficulty taking medications as she wants to take care of health. Has also had a recent loss of her brother in October. She says she would like to see a therapist.   Hypothyroidism Medication of 100 mg daily of levothyroxine however has difficulty taking medications daily as discussed above. Last TSH 19.6 04/16/21.  HLD Lipid panel 04/16/21 showed total cholesterol oof 273, has difficulty taking her medications daily.  Alcohol Use Disorder She attests to not drinking as much. She says she usually drinks 1 bottle a day and has tapered to 2 bottles every 3 days. She has not had any alcohol since the evening of the 8th. She denies any seizures or DTs.   Urge Incontinence  She says she wears pads all the time and needs them most in the mornings when she wakes. Not worse with sneezing/coughing. She has had to clean up floor twice due to urge to urinate. Denies dysuria, denies fevers.   PERTINENT  PMH / PSH: alcohol use disorder, recent fall 12/9, htn, hld  OBJECTIVE:   BP 114/78   Pulse 71   Ht 5\' 6"  (1.676 m)   Wt 169 lb 12.8 oz (77 kg)   SpO2 98%   BMI 27.41 kg/m   General: NAD, awake, alert, responsive to questions Head: Bruising right eye, abrasions on face. EOMI See  media CV: Regular rate and rhythm no murmurs rubs or gallops Respiratory: Clear to ausculation bilaterally, no wheezes rales or crackles Extremities: Moves upper and lower extremities freely, no edema in LE Neuro:  Neuro: CN II: PERRL CN III, IV,VI: EOMI CV V: Normal sensation in V1, V2, V3 CVII: Symmetric smile and brow raise CN VIII: Normal hearing CN IX,X: Symmetric palate raise  CN XI: 5/5 shoulder shrug CN XII: Symmetric tongue protrusion  UE and LE strength 5/5 Normal sensation in UE and LE bilaterally     ASSESSMENT/PLAN:   Depression, recurrent (HCC) PHQ-9 0 today. Recent grief from brother dying in October. Is not currently taking effexor daily. -Therapy/counseling resources provided  Personal history of fall No neurological deficits on exam. -Referral to sports medicine for thumb fracture (splinted in ED)  Hypothyroidism Currently not taking levothyroxine daily. -Monitor  HYPERCHOLESTEROLEMIA Has not been taking her statin daily.   Alcohol use disorder, mild, in controlled environment Currently drinking 1-2 bottles of wine every 3 days. Has not tried the naltrexone. -Therapy resources provided in AVS for patient  Urge incontinence Consider myrbetriq in future after patient feels okay taking medications.     Gerrit Heck, MD [place]

## 2021-10-21 ENCOUNTER — Encounter: Payer: Self-pay | Admitting: Student

## 2021-10-21 DIAGNOSIS — N3941 Urge incontinence: Secondary | ICD-10-CM | POA: Insufficient documentation

## 2021-10-21 NOTE — Assessment & Plan Note (Signed)
Consider myrbetriq in future after patient feels okay taking medications.

## 2021-10-21 NOTE — Assessment & Plan Note (Signed)
PHQ-9 0 today. Recent grief from brother dying in October. Is not currently taking effexor daily. -Therapy/counseling resources provided

## 2021-10-21 NOTE — Assessment & Plan Note (Signed)
Currently not taking levothyroxine daily. -Monitor

## 2021-10-21 NOTE — Assessment & Plan Note (Signed)
Currently drinking 1-2 bottles of wine every 3 days. Has not tried the naltrexone. -Therapy resources provided in AVS for patient

## 2021-10-21 NOTE — Assessment & Plan Note (Signed)
Has not been taking her statin daily.

## 2021-10-21 NOTE — Assessment & Plan Note (Signed)
No neurological deficits on exam. -Referral to sports medicine for thumb fracture (splinted in ED)

## 2021-10-24 DIAGNOSIS — S62522A Displaced fracture of distal phalanx of left thumb, initial encounter for closed fracture: Secondary | ICD-10-CM | POA: Diagnosis not present

## 2021-10-28 DIAGNOSIS — M25642 Stiffness of left hand, not elsewhere classified: Secondary | ICD-10-CM | POA: Diagnosis not present

## 2021-10-28 DIAGNOSIS — S62522A Displaced fracture of distal phalanx of left thumb, initial encounter for closed fracture: Secondary | ICD-10-CM | POA: Diagnosis not present

## 2021-10-28 DIAGNOSIS — M25542 Pain in joints of left hand: Secondary | ICD-10-CM | POA: Diagnosis not present

## 2021-11-06 DIAGNOSIS — M25642 Stiffness of left hand, not elsewhere classified: Secondary | ICD-10-CM | POA: Diagnosis not present

## 2021-11-06 DIAGNOSIS — M25542 Pain in joints of left hand: Secondary | ICD-10-CM | POA: Diagnosis not present

## 2021-11-06 DIAGNOSIS — S62522A Displaced fracture of distal phalanx of left thumb, initial encounter for closed fracture: Secondary | ICD-10-CM | POA: Diagnosis not present

## 2021-11-11 DIAGNOSIS — S62522A Displaced fracture of distal phalanx of left thumb, initial encounter for closed fracture: Secondary | ICD-10-CM | POA: Diagnosis not present

## 2021-11-26 DIAGNOSIS — E785 Hyperlipidemia, unspecified: Secondary | ICD-10-CM | POA: Diagnosis not present

## 2021-11-26 DIAGNOSIS — E039 Hypothyroidism, unspecified: Secondary | ICD-10-CM | POA: Diagnosis not present

## 2021-11-26 DIAGNOSIS — L570 Actinic keratosis: Secondary | ICD-10-CM | POA: Diagnosis not present

## 2021-11-26 DIAGNOSIS — L821 Other seborrheic keratosis: Secondary | ICD-10-CM | POA: Diagnosis not present

## 2021-11-26 DIAGNOSIS — E663 Overweight: Secondary | ICD-10-CM | POA: Diagnosis not present

## 2021-11-26 DIAGNOSIS — D2261 Melanocytic nevi of right upper limb, including shoulder: Secondary | ICD-10-CM | POA: Diagnosis not present

## 2021-11-26 DIAGNOSIS — I10 Essential (primary) hypertension: Secondary | ICD-10-CM | POA: Diagnosis not present

## 2021-11-26 DIAGNOSIS — D225 Melanocytic nevi of trunk: Secondary | ICD-10-CM | POA: Diagnosis not present

## 2021-11-26 DIAGNOSIS — H3530 Unspecified macular degeneration: Secondary | ICD-10-CM | POA: Diagnosis not present

## 2021-11-26 DIAGNOSIS — F1020 Alcohol dependence, uncomplicated: Secondary | ICD-10-CM | POA: Diagnosis not present

## 2021-11-26 DIAGNOSIS — F419 Anxiety disorder, unspecified: Secondary | ICD-10-CM | POA: Diagnosis not present

## 2021-11-26 DIAGNOSIS — D1801 Hemangioma of skin and subcutaneous tissue: Secondary | ICD-10-CM | POA: Diagnosis not present

## 2021-11-26 DIAGNOSIS — L814 Other melanin hyperpigmentation: Secondary | ICD-10-CM | POA: Diagnosis not present

## 2021-11-26 DIAGNOSIS — Z85828 Personal history of other malignant neoplasm of skin: Secondary | ICD-10-CM | POA: Diagnosis not present

## 2021-12-02 DIAGNOSIS — S62522D Displaced fracture of distal phalanx of left thumb, subsequent encounter for fracture with routine healing: Secondary | ICD-10-CM | POA: Diagnosis not present

## 2021-12-02 DIAGNOSIS — S62522A Displaced fracture of distal phalanx of left thumb, initial encounter for closed fracture: Secondary | ICD-10-CM | POA: Diagnosis not present

## 2021-12-02 DIAGNOSIS — M25542 Pain in joints of left hand: Secondary | ICD-10-CM | POA: Diagnosis not present

## 2021-12-02 DIAGNOSIS — S62522S Displaced fracture of distal phalanx of left thumb, sequela: Secondary | ICD-10-CM | POA: Diagnosis not present

## 2021-12-02 DIAGNOSIS — M25642 Stiffness of left hand, not elsewhere classified: Secondary | ICD-10-CM | POA: Diagnosis not present

## 2021-12-16 DIAGNOSIS — M25542 Pain in joints of left hand: Secondary | ICD-10-CM | POA: Diagnosis not present

## 2021-12-16 DIAGNOSIS — S62522S Displaced fracture of distal phalanx of left thumb, sequela: Secondary | ICD-10-CM | POA: Diagnosis not present

## 2021-12-16 DIAGNOSIS — M25642 Stiffness of left hand, not elsewhere classified: Secondary | ICD-10-CM | POA: Diagnosis not present

## 2021-12-18 DIAGNOSIS — F4321 Adjustment disorder with depressed mood: Secondary | ICD-10-CM | POA: Diagnosis not present

## 2021-12-23 DIAGNOSIS — M25642 Stiffness of left hand, not elsewhere classified: Secondary | ICD-10-CM | POA: Diagnosis not present

## 2021-12-23 DIAGNOSIS — M25542 Pain in joints of left hand: Secondary | ICD-10-CM | POA: Diagnosis not present

## 2021-12-23 DIAGNOSIS — S62522S Displaced fracture of distal phalanx of left thumb, sequela: Secondary | ICD-10-CM | POA: Diagnosis not present

## 2021-12-23 DIAGNOSIS — S62522A Displaced fracture of distal phalanx of left thumb, initial encounter for closed fracture: Secondary | ICD-10-CM | POA: Diagnosis not present

## 2022-01-07 NOTE — Progress Notes (Signed)
? ? ?  SUBJECTIVE:  ? ?CHIEF COMPLAINT / HPI:  ?Chief Complaint  ?Patient presents with  ? Cough  ? Sore Throat  ? Nasal Congestion  ? Fever  ?  ?Started feeling ill yesterday with sore throat, congestion, headache, fatigue, subjective fever. Appetite is poor but drinking OK. Denies myalgias, chest pain, SOB. No sick contacts. She went to a CPR class on Sunday, she will ask if anyone else is sick. ? ?PERTINENT  PMH / PSH: HTN, hypothyroidism, chronic rhinitis ? ?Patient Care Team: ?Gerrit Heck, MD as PCP - General (Family Medicine) ?Sydnee Levans, MD as Consulting Physician (Dermatology)  ? ?OBJECTIVE:  ? ?BP (!) 131/91   Pulse 85   Temp 98.8 ?F (37.1 ?C)   Ht 5\' 6"  (1.676 m)   Wt 163 lb 9.6 oz (74.2 kg)   SpO2 98%   BMI 26.41 kg/m?   ?Physical Exam ?Constitutional:   ?   General: She is not in acute distress. ?   Appearance: She is normal weight. She is not ill-appearing or toxic-appearing.  ?HENT:  ?   Head: Normocephalic and atraumatic.  ?   Left Ear: Tympanic membrane normal.  ?   Ears:  ?   Comments: Right TM obscured by cerumen. ?   Mouth/Throat:  ?   Mouth: Mucous membranes are moist.  ?   Pharynx: Posterior oropharyngeal erythema present. No pharyngeal swelling or oropharyngeal exudate.  ?   Tonsils: No tonsillar exudate or tonsillar abscesses.  ?   Comments: Mildly erythematous ?Cardiovascular:  ?   Rate and Rhythm: Normal rate and regular rhythm.  ?Pulmonary:  ?   Effort: Pulmonary effort is normal. No respiratory distress.  ?   Breath sounds: Normal breath sounds.  ?Musculoskeletal:  ?   Cervical back: Neck supple.  ?Neurological:  ?   Mental Status: She is alert.  ?  ? ?Depression screen [facility] 2/9 10/20/2021  ?Decreased Interest 0  ?Down, Depressed, Hopeless 0  ?PHQ - 2 Score 0  ?Altered sleeping 0  ?Tired, decreased energy 0  ?Change in appetite 0  ?Feeling bad or failure about yourself  0  ?Trouble concentrating 0  ?Moving slowly or fidgety/restless 0  ?Suicidal thoughts 0  ?PHQ-9 Score 0   ?Difficult doing work/chores -  ?Some recent data might be hidden  ?  ? ?{Show previous vital signs (optional):23777} ? ? ? ?ASSESSMENT/PLAN:  ? ?Viral URI ?URI symptoms for 1 day, low suspicion for bacterial etiology. Afebrile in office but reports subjective fevers. Rapid influenza test negative. ?- supportive care, return precautions given ?- Covid test pending ? ?Return if symptoms worsen or fail to improve.  ? ?Zola Button, MD ?[place]  ?

## 2022-01-07 NOTE — Patient Instructions (Addendum)
It was nice seeing you today! ? ?I recommend saltwater gargles for sore throat. ? ?I recommend honey with or without tea for cough. ? ?Take Tylenol as needed for headache and fever. ? ?I would recommend quarantining until your test results come back. ? ?Give [place] a call or go to the ED if you develop chest pain or shortness of breath. ? ?Stay well, ?Zola Button, MD ?[place] ?[phone] ? ?-- ? ?Make sure to check out at the front desk before you leave today. ? ?Please arrive at least 15 minutes prior to your scheduled appointments. ? ?If you had blood work today, I will send you a MyChart message or a letter if results are normal. Otherwise, I will give you a call. ? ?If you had a referral placed, they will call you to set up an appointment. Please give [place] a call if you don't hear back in the next 2 weeks. ? ?If you need additional refills before your next appointment, please call your pharmacy first.  ?

## 2022-01-08 ENCOUNTER — Ambulatory Visit (INDEPENDENT_AMBULATORY_CARE_PROVIDER_SITE_OTHER): Payer: PPO | Admitting: Family Medicine

## 2022-01-08 ENCOUNTER — Other Ambulatory Visit: Payer: Self-pay

## 2022-01-08 VITALS — BP 131/91 | HR 85 | Temp 98.8°F | Ht 66.0 in | Wt 163.6 lb

## 2022-01-08 DIAGNOSIS — J069 Acute upper respiratory infection, unspecified: Secondary | ICD-10-CM | POA: Diagnosis not present

## 2022-01-08 DIAGNOSIS — Z20822 Contact with and (suspected) exposure to covid-19: Secondary | ICD-10-CM

## 2022-01-08 DIAGNOSIS — J029 Acute pharyngitis, unspecified: Secondary | ICD-10-CM

## 2022-01-08 LAB — POCT INFLUENZA A/B
Influenza A, POC: NEGATIVE
Influenza B, POC: NEGATIVE

## 2022-01-09 LAB — NOVEL CORONAVIRUS, NAA: SARS-CoV-2, NAA: NOT DETECTED

## 2022-01-21 DIAGNOSIS — R051 Acute cough: Secondary | ICD-10-CM | POA: Diagnosis not present

## 2022-01-21 DIAGNOSIS — J019 Acute sinusitis, unspecified: Secondary | ICD-10-CM | POA: Diagnosis not present

## 2022-01-29 DIAGNOSIS — S62522A Displaced fracture of distal phalanx of left thumb, initial encounter for closed fracture: Secondary | ICD-10-CM | POA: Diagnosis not present

## 2022-02-02 DIAGNOSIS — F4321 Adjustment disorder with depressed mood: Secondary | ICD-10-CM | POA: Diagnosis not present

## 2022-02-12 ENCOUNTER — Ambulatory Visit (INDEPENDENT_AMBULATORY_CARE_PROVIDER_SITE_OTHER): Payer: PPO | Admitting: Student

## 2022-02-12 ENCOUNTER — Encounter: Payer: Self-pay | Admitting: Student

## 2022-02-12 VITALS — BP 130/80 | HR 88 | Ht 66.0 in | Wt 164.0 lb

## 2022-02-12 DIAGNOSIS — F339 Major depressive disorder, recurrent, unspecified: Secondary | ICD-10-CM | POA: Diagnosis not present

## 2022-02-12 DIAGNOSIS — Z91148 Patient's other noncompliance with medication regimen for other reason: Secondary | ICD-10-CM | POA: Diagnosis not present

## 2022-02-12 DIAGNOSIS — F1020 Alcohol dependence, uncomplicated: Secondary | ICD-10-CM | POA: Diagnosis not present

## 2022-02-12 DIAGNOSIS — Z0000 Encounter for general adult medical examination without abnormal findings: Secondary | ICD-10-CM | POA: Diagnosis not present

## 2022-02-12 DIAGNOSIS — E039 Hypothyroidism, unspecified: Secondary | ICD-10-CM

## 2022-02-12 DIAGNOSIS — N3941 Urge incontinence: Secondary | ICD-10-CM | POA: Diagnosis not present

## 2022-02-12 DIAGNOSIS — Z659 Problem related to unspecified psychosocial circumstances: Secondary | ICD-10-CM | POA: Diagnosis not present

## 2022-02-12 NOTE — Assessment & Plan Note (Addendum)
Heavy use drinking 1 bottle of chardonnay a night. Patient has gone through [facility] in the past. Not interested in SW referral/resources today and not interested in quitting. Not taking naltrexone. Discussed in depth about alcohol use and risks of using this much. ?-Monitor  ?-CMP ?

## 2022-02-12 NOTE — Patient Instructions (Signed)
Today at your annual preventive visit we talked about the following measures: ? ? ?I recommend 150 minutes of exercise per week-try 30 minutes 5 days per week ?We discussed reducing sugary beverages (like soda and juice) and increasing leafy greens and whole fruits.  ?We discussed avoiding tobacco and alcohol.  ?I recommend avoiding illicit substances.  ? ?Our plans for today:  ?- We are going to check your liver enzymes and thyroid function today ?- Please let me know if you would like any help with your alcohol use in the future ?- practice kegels for urinary control and please let us know if you would like medication or PT referral for this in the future ? ?Best, ? ?Gerrit Heck, MD ?[facility]  ?

## 2022-02-12 NOTE — Assessment & Plan Note (Signed)
Grief related to brother's loss. Following with therapist. Not taking effexor currently. ? ?

## 2022-02-12 NOTE — Progress Notes (Signed)
? ? ?  SUBJECTIVE:  ? ?Chief compliant/HPI: annual examination ? ?Amber Allen is a 81 y.o. who presents today for an annual exam.  ? ?History tabs reviewed and updated.  ? ?Alcohol Use Disorder ?Patient says she is drinking 1 bottle of chardonnay a night. Denies any use before driving and says she is not interested in slowing down or quitting. Is in therapy currently but does not focus on alcohol use currently. ? ?Grief ?Patient says that she has been grieving loss of her brother since October and that she has been having a very difficult time due to her being close to him and also that he was younger than her. She is in therapy currently and this has been helping her tremendously to process the events.  ?[place] Office Visit from 02/12/2022 in [place]  ?PHQ-9 Total Score 2  ? ?  ?  ?Medication Noncompliance ?Patient is in therapy specifically for discussing why she is unable to take her medications. She says that she believes there is a tie to her brothers loss and not being able to take medications currently ? ?Hypothyroidism ?Has been able to take synthroid sometimes in the mornings which is new for her.  ? ?Urinary Incontinence ?Has been leaking urine and denies any dysuria, fevers, abdominal pain. Has hx of 2 vaginal deliveries. She says that she does not have the urge she just urinates sometimes. She has been practicing kegels at home. ? ? ?OBJECTIVE:  ? ?BP 130/80   Pulse 88   Ht '5\' 6"'$  (1.676 m)   Wt 164 lb (74.4 kg)   SpO2 97%   BMI 26.47 kg/m?   ?General: awake, alert, responsive to questions, sad affect ?Head: Normocephalic atraumatic ?CV: Regular rate and rhythm  ?Respiratory: Clear to ausculation bilaterally ?Abdomen: Soft, non-tender, non-distended, normoactive bowel sounds  ?Extremities: Moves upper and lower extremities freely ? ?ASSESSMENT/PLAN:  ? ?Hypothyroidism ?Has been taking synthroid intermittently.  ?-TSH  ? ?Alcohol use disorder, severe, dependence  ([place]) ?Heavy use drinking 1 bottle of chardonnay a night. Patient has gone through [facility] in the past. Not interested in SW referral/resources today and not interested in quitting. Not taking naltrexone. Discussed in depth about alcohol use and risks of using this much. ?-Monitor  ?-CMP ? ?Medication nonadherence due to psychosocial problem ?Patient working with therapist currently to help with this. Started taking synthroid more often. ? ?Urge incontinence ?Not interested in medication or pelvic floor PT today. Encouraged kegels. ?-Monitor ? ?Depression, recurrent ([place]) ?Grief related to brother's loss. Following with therapist. Not taking effexor currently. ? ?  ?Gerrit Heck, MD ?[place]   ?

## 2022-02-12 NOTE — Assessment & Plan Note (Signed)
Not interested in medication or pelvic floor PT today. Encouraged kegels. ?-Monitor ?

## 2022-02-12 NOTE — Assessment & Plan Note (Signed)
Has been taking synthroid intermittently.  ?-TSH  ?

## 2022-02-12 NOTE — Assessment & Plan Note (Signed)
Patient working with therapist currently to help with this. Started taking synthroid more often. ?

## 2022-02-13 ENCOUNTER — Encounter: Payer: Self-pay | Admitting: Student

## 2022-02-13 LAB — COMPREHENSIVE METABOLIC PANEL
ALT: 12 IU/L (ref 0–32)
AST: 22 IU/L (ref 0–40)
Albumin/Globulin Ratio: 1.8 (ref 1.2–2.2)
Albumin: 4.2 g/dL (ref 3.6–4.6)
Alkaline Phosphatase: 92 IU/L (ref 44–121)
BUN/Creatinine Ratio: 13 (ref 12–28)
BUN: 11 mg/dL (ref 8–27)
Bilirubin Total: 0.4 mg/dL (ref 0.0–1.2)
CO2: 24 mmol/L (ref 20–29)
Calcium: 9.9 mg/dL (ref 8.7–10.3)
Chloride: 104 mmol/L (ref 96–106)
Creatinine, Ser: 0.83 mg/dL (ref 0.57–1.00)
Globulin, Total: 2.4 g/dL (ref 1.5–4.5)
Glucose: 77 mg/dL (ref 70–99)
Potassium: 4.5 mmol/L (ref 3.5–5.2)
Sodium: 142 mmol/L (ref 134–144)
Total Protein: 6.6 g/dL (ref 6.0–8.5)
eGFR: 71 mL/min/{1.73_m2} (ref >59)

## 2022-02-13 LAB — TSH: TSH: 5 u[IU]/mL — ABNORMAL HIGH (ref 0.450–4.500)

## 2022-02-17 DIAGNOSIS — F4321 Adjustment disorder with depressed mood: Secondary | ICD-10-CM | POA: Diagnosis not present

## 2022-04-01 ENCOUNTER — Other Ambulatory Visit (HOSPITAL_COMMUNITY): Payer: Self-pay

## 2022-04-13 ENCOUNTER — Other Ambulatory Visit: Payer: Self-pay | Admitting: Student

## 2022-04-13 ENCOUNTER — Other Ambulatory Visit (HOSPITAL_COMMUNITY): Payer: Self-pay

## 2022-04-14 ENCOUNTER — Encounter: Payer: Self-pay | Admitting: *Deleted

## 2022-04-15 ENCOUNTER — Other Ambulatory Visit (HOSPITAL_COMMUNITY): Payer: Self-pay

## 2022-04-15 MED ORDER — LEVOTHYROXINE SODIUM 100 MCG PO TABS
ORAL_TABLET | ORAL | 3 refills | Status: DC
  Filled 2022-04-15: qty 90, 90d supply, fill #0
  Filled 2022-08-12: qty 90, 90d supply, fill #1
  Filled 2022-12-21: qty 90, 90d supply, fill #2

## 2022-04-16 ENCOUNTER — Other Ambulatory Visit (HOSPITAL_COMMUNITY): Payer: Self-pay

## 2022-04-23 DIAGNOSIS — F4321 Adjustment disorder with depressed mood: Secondary | ICD-10-CM | POA: Diagnosis not present

## 2022-07-01 ENCOUNTER — Encounter: Payer: Self-pay | Admitting: Student

## 2022-07-01 ENCOUNTER — Telehealth: Payer: Self-pay

## 2022-07-01 NOTE — Telephone Encounter (Signed)
Patient calls nurse line reporting congestion, cough and headaches since Sunday morning.   Patient denies fevers, chills, body aches or any GI symptoms.   Patient asked if she has been around sick contacts and she replied "oh yes."   Patient has not tried any OTC medications due to her thyroid disease. Patient would like PCP recommendations.  Patient advised to drink warm teas with lemon and honey. Test for covid if able.   ED precautions given.   Will forward to PCP.

## 2022-07-06 DIAGNOSIS — Z961 Presence of intraocular lens: Secondary | ICD-10-CM | POA: Diagnosis not present

## 2022-07-06 DIAGNOSIS — H353132 Nonexudative age-related macular degeneration, bilateral, intermediate dry stage: Secondary | ICD-10-CM | POA: Diagnosis not present

## 2022-07-06 DIAGNOSIS — H52223 Regular astigmatism, bilateral: Secondary | ICD-10-CM | POA: Diagnosis not present

## 2022-08-12 ENCOUNTER — Other Ambulatory Visit (HOSPITAL_COMMUNITY): Payer: Self-pay

## 2022-08-14 ENCOUNTER — Other Ambulatory Visit (HOSPITAL_COMMUNITY): Payer: Self-pay

## 2022-11-20 ENCOUNTER — Encounter: Payer: Self-pay | Admitting: Student

## 2022-12-08 DIAGNOSIS — G8929 Other chronic pain: Secondary | ICD-10-CM | POA: Diagnosis not present

## 2022-12-08 DIAGNOSIS — E785 Hyperlipidemia, unspecified: Secondary | ICD-10-CM | POA: Diagnosis not present

## 2022-12-08 DIAGNOSIS — F1020 Alcohol dependence, uncomplicated: Secondary | ICD-10-CM | POA: Diagnosis not present

## 2022-12-08 DIAGNOSIS — E663 Overweight: Secondary | ICD-10-CM | POA: Diagnosis not present

## 2022-12-08 DIAGNOSIS — E039 Hypothyroidism, unspecified: Secondary | ICD-10-CM | POA: Diagnosis not present

## 2022-12-08 DIAGNOSIS — H3530 Unspecified macular degeneration: Secondary | ICD-10-CM | POA: Diagnosis not present

## 2022-12-08 DIAGNOSIS — Z8673 Personal history of transient ischemic attack (TIA), and cerebral infarction without residual deficits: Secondary | ICD-10-CM | POA: Diagnosis not present

## 2022-12-21 ENCOUNTER — Other Ambulatory Visit (HOSPITAL_COMMUNITY): Payer: Self-pay

## 2022-12-24 DIAGNOSIS — D225 Melanocytic nevi of trunk: Secondary | ICD-10-CM | POA: Diagnosis not present

## 2022-12-24 DIAGNOSIS — L821 Other seborrheic keratosis: Secondary | ICD-10-CM | POA: Diagnosis not present

## 2022-12-24 DIAGNOSIS — L738 Other specified follicular disorders: Secondary | ICD-10-CM | POA: Diagnosis not present

## 2022-12-24 DIAGNOSIS — L578 Other skin changes due to chronic exposure to nonionizing radiation: Secondary | ICD-10-CM | POA: Diagnosis not present

## 2022-12-24 DIAGNOSIS — L814 Other melanin hyperpigmentation: Secondary | ICD-10-CM | POA: Diagnosis not present

## 2022-12-24 DIAGNOSIS — D1801 Hemangioma of skin and subcutaneous tissue: Secondary | ICD-10-CM | POA: Diagnosis not present

## 2022-12-24 DIAGNOSIS — Z85828 Personal history of other malignant neoplasm of skin: Secondary | ICD-10-CM | POA: Diagnosis not present

## 2022-12-24 DIAGNOSIS — L720 Epidermal cyst: Secondary | ICD-10-CM | POA: Diagnosis not present

## 2023-01-01 ENCOUNTER — Telehealth: Payer: Self-pay

## 2023-01-01 NOTE — Telephone Encounter (Signed)
Patient calls nurse line reporting flu like symptoms.   She reports symptoms started last night with a headache, congestion, cough and fevers. She reports tmax of 100.  She denies any nausea, vomiting, diarrhea or SOB.   Conservative measures given to patient. Warm teas with honey, decongestants and Tylenol/Ibuprofen. Encouraged hydration.  We do not have any apts today and she reports unable to test for Covid at home.   Patient advised to make an apt next week if symptoms worsen or fail to improve.   ED precautions given to patient.

## 2023-01-03 DIAGNOSIS — R509 Fever, unspecified: Secondary | ICD-10-CM | POA: Diagnosis not present

## 2023-01-03 DIAGNOSIS — J029 Acute pharyngitis, unspecified: Secondary | ICD-10-CM | POA: Diagnosis not present

## 2023-01-03 DIAGNOSIS — R519 Headache, unspecified: Secondary | ICD-10-CM | POA: Diagnosis not present

## 2023-01-03 DIAGNOSIS — R52 Pain, unspecified: Secondary | ICD-10-CM | POA: Diagnosis not present

## 2023-02-15 ENCOUNTER — Encounter: Payer: PPO | Admitting: Student

## 2023-02-15 NOTE — Progress Notes (Unsigned)
    SUBJECTIVE:   Chief compliant/HPI: annual examination  Amber Allen is a 82 y.o. who presents today for an annual exam.   HTN  Alcohol Use   History tabs reviewed and updated ***.   Review of systems form reviewed and notable for ***.   OBJECTIVE:   There were no vitals taken for this visit.  ***  ASSESSMENT/PLAN:   No problem-specific Assessment & Plan notes found for this encounter.    Annual Examination  See AVS for age appropriate recommendations  PHQ score ***, reviewed and discussed.  BP reviewed and at goal ***.  Asked about intimate partner violence and resources given as appropriate  Advance directives discussion ***  Considered the following items based upon USPSTF recommendations: Diabetes screening: {discussed/ordered:14545} Screening for elevated cholesterol: {discussed/ordered:14545} HIV testing: {discussed/ordered:14545} Hepatitis C: {discussed/ordered:14545} Hepatitis B: {discussed/ordered:14545} Syphilis if at high risk: {discussed/ordered:14545} GC/CT {GC/CT screening :23818} Osteoporosis screening considered based upon risk of fracture from [facility] calculator. Major osteoporotic fracture risk is ***%. DEXA {ordered not order:23822}.  Reviewed risk factors for latent tuberculosis and {not indicated/requested/declined:14582}   Discussed family history, BRCA testing {not indicated/requested/declined:14582}. Tool used to risk stratify was ***.  Cervical cancer screening: {PAPTYPE:23819} Breast cancer screening: {mammoscreen:23820} Colorectal cancer screening: {crcscreen:23821::"discussed, colonoscopy ordered"} Lung cancer screening: {discussed/declined/written info:19698}. See documentation below regarding indications/risks/benefits.  Vaccinations ***.   Follow up in 1 *** year or sooner if indicated.    Levin Erp, MD [facility]

## 2023-02-16 ENCOUNTER — Encounter: Payer: Self-pay | Admitting: Student

## 2023-02-16 ENCOUNTER — Ambulatory Visit (INDEPENDENT_AMBULATORY_CARE_PROVIDER_SITE_OTHER): Payer: PPO | Admitting: Student

## 2023-02-16 VITALS — BP 130/72 | HR 75 | Ht 66.0 in | Wt 168.0 lb

## 2023-02-16 DIAGNOSIS — G8929 Other chronic pain: Secondary | ICD-10-CM

## 2023-02-16 DIAGNOSIS — Z8673 Personal history of transient ischemic attack (TIA), and cerebral infarction without residual deficits: Secondary | ICD-10-CM | POA: Diagnosis not present

## 2023-02-16 DIAGNOSIS — K649 Unspecified hemorrhoids: Secondary | ICD-10-CM | POA: Diagnosis not present

## 2023-02-16 DIAGNOSIS — M25562 Pain in left knee: Secondary | ICD-10-CM

## 2023-02-16 DIAGNOSIS — Q396 Congenital diverticulum of esophagus: Secondary | ICD-10-CM

## 2023-02-16 DIAGNOSIS — Z13228 Encounter for screening for other metabolic disorders: Secondary | ICD-10-CM | POA: Diagnosis not present

## 2023-02-16 DIAGNOSIS — Z87898 Personal history of other specified conditions: Secondary | ICD-10-CM

## 2023-02-16 MED ORDER — DICLOFENAC SODIUM 1 % EX GEL: 4.0000 g | Freq: Four times a day (QID) | CUTANEOUS | 0 refills | Status: DC

## 2023-02-16 MED ORDER — ATORVASTATIN CALCIUM 40 MG PO TABS: ORAL_TABLET | ORAL | 3 refills | Status: DC

## 2023-02-16 MED ORDER — ASPIRIN 81 MG PO TBEC: 81.0000 mg | DELAYED_RELEASE_TABLET | Freq: Every day | ORAL | 12 refills | Status: AC

## 2023-02-16 NOTE — Assessment & Plan Note (Signed)
Patient symptomatic with choking episodes.  Discussed that best course would be getting evaluated by GI again. -GI referral placed

## 2023-02-16 NOTE — Assessment & Plan Note (Signed)
Discussed that aspirin for secondary prevention would be advised.  She denies any history of GI bleeding.  I also counseled on the risk of if she were to restart drinking there would be a risk of gastritis/increased risk of head bleed after falling.  So discussed the importance of statin medication for prevention of additional CV outcomes. -ASA 81 mg sent in -Refilled atorvastatin 40 mg

## 2023-02-16 NOTE — Assessment & Plan Note (Signed)
Discussed symptomatic measures to help with this.  Does not have any bleeding but she is able to feel this area when she is wiping.  She declines any further colonoscopy. -Increase fiber and water intake -Symptomatic measures discussed

## 2023-02-16 NOTE — Assessment & Plan Note (Signed)
Congratulated patient on quitting alcohol use! -CMP

## 2023-02-16 NOTE — Patient Instructions (Addendum)
It was great to see you! Thank you for allowing me to participate in your care!   Our plans for today:  -Congratulations on quitting drinking!!! -For your history of stroke-you may do aspirin 81 mg daily, I recommend doing a statin medication given your history of this to prevent additional CV events -We will check your labs today-I will let you know what this shows -I am sending in Voltaren gel for your knee pain-let me know if this worsens -For your hemorrhoids I recommend increasing your fiber intake and increasing water intake, Calmoseptine ointment can be helpful to if you get irritation -I am referring you to gastroenterology for your esophageal diverticula because you are symptomatic  Take care and seek immediate care sooner if you develop any concerns.  Levin Erp, MD

## 2023-02-17 LAB — CBC
Hematocrit: 39.7 % (ref 34.0–46.6)
Hemoglobin: 13.1 g/dL (ref 11.1–15.9)
MCH: 31.9 pg (ref 26.6–33.0)
MCHC: 33 g/dL (ref 31.5–35.7)
MCV: 97 fL (ref 79–97)
Platelets: 305 10*3/uL (ref 150–450)
RBC: 4.11 x10E6/uL (ref 3.77–5.28)
RDW: 10.9 % — ABNORMAL LOW (ref 11.7–15.4)
WBC: 6.6 10*3/uL (ref 3.4–10.8)

## 2023-02-17 LAB — COMPREHENSIVE METABOLIC PANEL
ALT: 13 IU/L (ref 0–32)
AST: 18 IU/L (ref 0–40)
Albumin/Globulin Ratio: 1.6 (ref 1.2–2.2)
Albumin: 4.1 g/dL (ref 3.7–4.7)
Alkaline Phosphatase: 92 IU/L (ref 44–121)
BUN/Creatinine Ratio: 11 — ABNORMAL LOW (ref 12–28)
BUN: 9 mg/dL (ref 8–27)
Bilirubin Total: 0.5 mg/dL (ref 0.0–1.2)
CO2: 23 mmol/L (ref 20–29)
Calcium: 9.6 mg/dL (ref 8.7–10.3)
Chloride: 103 mmol/L (ref 96–106)
Creatinine, Ser: 0.85 mg/dL (ref 0.57–1.00)
Globulin, Total: 2.5 g/dL (ref 1.5–4.5)
Glucose: 79 mg/dL (ref 70–99)
Potassium: 4.4 mmol/L (ref 3.5–5.2)
Sodium: 138 mmol/L (ref 134–144)
Total Protein: 6.6 g/dL (ref 6.0–8.5)
eGFR: 68 mL/min/{1.73_m2} (ref >59)

## 2023-02-17 LAB — TSH RFX ON ABNORMAL TO FREE T4: TSH: 1.77 u[IU]/mL (ref 0.450–4.500)

## 2023-03-01 ENCOUNTER — Other Ambulatory Visit: Payer: Self-pay | Admitting: Gastroenterology

## 2023-03-01 DIAGNOSIS — R1310 Dysphagia, unspecified: Secondary | ICD-10-CM | POA: Diagnosis not present

## 2023-03-01 DIAGNOSIS — K5730 Diverticulosis of large intestine without perforation or abscess without bleeding: Secondary | ICD-10-CM | POA: Diagnosis not present

## 2023-03-01 DIAGNOSIS — R112 Nausea with vomiting, unspecified: Secondary | ICD-10-CM | POA: Diagnosis not present

## 2023-03-05 ENCOUNTER — Ambulatory Visit
Admission: RE | Admit: 2023-03-05 | Discharge: 2023-03-05 | Disposition: A | Discharge status: Home / Self Care | Payer: PPO | Source: Ambulatory Visit | Attending: Gastroenterology | Admitting: Gastroenterology

## 2023-03-05 DIAGNOSIS — K219 Gastro-esophageal reflux disease without esophagitis: Secondary | ICD-10-CM | POA: Diagnosis not present

## 2023-03-05 DIAGNOSIS — R1310 Dysphagia, unspecified: Secondary | ICD-10-CM | POA: Diagnosis not present

## 2023-03-05 DIAGNOSIS — K224 Dyskinesia of esophagus: Secondary | ICD-10-CM | POA: Diagnosis not present

## 2023-03-08 ENCOUNTER — Telehealth: Payer: Self-pay | Admitting: Student

## 2023-03-08 NOTE — Telephone Encounter (Signed)
Contacted Su Hoff Angulo to schedule their annual wellness visit. Appointment made for 03/12/2023.  Thank you,  [facility] Support [facility] Direct dial  [phone]

## 2023-03-12 ENCOUNTER — Ambulatory Visit (INDEPENDENT_AMBULATORY_CARE_PROVIDER_SITE_OTHER): Payer: PPO

## 2023-03-12 DIAGNOSIS — Z0000 Encounter for general adult medical examination without abnormal findings: Secondary | ICD-10-CM

## 2023-03-12 NOTE — Patient Instructions (Signed)

## 2023-03-12 NOTE — Progress Notes (Signed)
I connected with  Amber Allen on 03/12/23 by a audio enabled telemedicine application and verified that I am speaking with the correct person using two identifiers.  Patient Location: Home  Provider Location: Home Office  I discussed the limitations of evaluation and management by telemedicine. The patient expressed understanding and agreed to proceed.   Subjective:   Amber Allen is a 82 y.o. female who presents for Medicare Annual (Subsequent) preventive examination.  Review of Systems    Per HPI unless specifically indicated below.  Cardiac Risk Factors include: advanced age (>58men, >22 women);female gender, Hypertension, and Hyperlipidemia.           Objective:       02/16/2023    8:37 AM 02/12/2022    9:20 AM 01/08/2022    9:30 AM  Vitals with BMI  Height 5\' 6"  5\' 6"  5\' 6"   Weight 168 lbs 164 lbs 163 lbs 10 oz  BMI 27.13 26.48 26.42  Systolic 130 130 161  Diastolic 72 80 91  Pulse 75 88 85    Today's Vitals   03/12/23 1035  PainSc: 0-No pain   There is no height or weight on file to calculate BMI.     02/16/2023    8:39 AM 02/12/2022    9:18 AM 01/08/2022    9:31 AM 10/17/2021    1:08 PM 01/30/2021    8:44 AM 05/28/2020    9:11 AM 01/19/2020    1:49 PM  Advanced Directives  Does Patient Have a Medical Advance Directive? No No No No No No;Yes No  Type of Holiday representative of Healthcare Power of Attorney in Chart?      Yes - validated most recent copy scanned in chart (See row information)   Would patient like information on creating a medical advance directive? No - Patient declined No - Patient declined No - Patient declined No - Patient declined No - Patient declined  No - Patient declined    Current Medications (verified) Outpatient Encounter Medications as of 03/12/2023  Medication Sig   levothyroxine (SYNTHROID) 100 MCG tablet TAKE 1 TABLET EVERY DAY BEFORE BREAKFAST   Multiple Vitamin (MULTIVITAMIN WITH MINERALS)  TABS tablet Take 1 tablet by mouth daily.   Multiple Vitamins-Minerals (PRESERVISION AREDS 2 PO) Take by mouth.   aspirin EC 81 MG tablet Take 1 tablet (81 mg total) by mouth daily. Swallow whole. (Patient not taking: Reported on 03/12/2023)   atorvastatin (LIPITOR) 40 MG tablet TAKE 1 TABLET EVERY DAY (Patient not taking: Reported on 03/12/2023)   [DISCONTINUED] diclofenac Sodium (VOLTAREN) 1 % GEL Apply 4 g topically 4 (four) times daily. (Patient not taking: Reported on 03/12/2023)   No facility-administered encounter medications on file as of 03/12/2023.    Allergies (verified) Brimonidine tartrate   History: 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   Cholecystolithiasis    De Quervain's tenosynovitis    De Quervain's tenosynovitis, left 09/09/2012   Depression    DIVERTICULITIS, HX OF 06/27/2008   Diverticulosis    Hyperlipidemia    Hypertension    Substance abuse (HCC)    Alcohol    Thyroid disease    Unspecified vitamin D deficiency 12/12/2008   Past Surgical History:  Procedure Laterality Date   APPENDECTOMY  1952   BREAST BIOPSY  1994   no malignancy   BREAST EXCISIONAL BIOPSY Left 1994  TONSILLECTOMY     VAGINAL HYSTERECTOMY  1974   including cervix   Family History  Problem Relation Age of Onset   Hepatitis Brother        hep C   Hearing loss Brother    Arthritis Brother    Heart failure Father        MI-65   Hypertension Father    Alcoholism Father    Hyperlipidemia Father    Heart disease Father    Alcohol abuse Father    Stroke Father    Heart failure Mother        18   Alcoholism Mother    Heart disease Mother    Alcohol abuse Mother    Social History   Socioeconomic History   Marital status: Divorced    Spouse name: Not on file   Number of children: 2   Years of education: 18   Highest education level: Manufacturing engineer (e.g., MA, MS, MEng, MEd, MSW, MBA)  Occupational History   Occupation: Runner, broadcasting/film/video   Occupation: Retired   Tobacco Use   Smoking status: Never   Smokeless tobacco: Never  Building services engineer Use: Never used  Substance and Sexual Activity   Alcohol use: Not Currently    Alcohol/week: 21.0 standard drinks of alcohol    Types: 21 Glasses of wine per week    Comment: quit 12/2022   Drug use: No   Sexual activity: Not Currently  Other Topics Concern   Not on file  Social History Narrative   Lives by herself with cat here in [place].    Teaches fall prevention class soon. No recent falls.    Divorced with 2 kids (daughter in [place])   Not exercising regularly.    Retired from working with [employer]. 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    Social Determinants of Health   Financial Resource Strain: Low Risk  (03/12/2023)   Overall Financial Resource Strain (CARDIA)    Difficulty of Paying Living Expenses: Not hard at all  Food Insecurity: No Food Insecurity (03/12/2023)   Hunger Vital Sign    Worried About Running Out of Food in the Last Year: Never true    Ran Out of Food in the Last Year: Never true  Transportation Needs: No Transportation Needs (03/12/2023)   PRAPARE - Administrator, Civil Service (Medical): No    Lack of Transportation (Non-Medical): No  Physical Activity: Inactive (03/12/2023)   Exercise Vital Sign    Days of Exercise per Week: 0 days    Minutes of Exercise per Session: 0 min  Stress: No Stress Concern Present (03/12/2023)   [employer] of Occupational Health - Occupational Stress Questionnaire    Feeling of Stress : Not at all  Social Connections: Moderately Integrated (03/12/2023)   Social Connection and Isolation Panel [NHANES]    Frequency of Communication with Friends and Family: More than three times a week    Frequency of Social Gatherings with Friends and Family: Once a week    Attends Religious Services: More than 4 times per year    Active Member of [employer] or Organizations: Yes    Attends Engineer, structural: More than 4 times  per year    Marital Status: Divorced    Tobacco Counseling Counseling given: No   Clinical Intake:  Pre-visit preparation completed: No  Pain : No/denies pain Pain Score: 0-No pain     Nutritional Status: BMI of 19-24  Normal Nutritional  Risks: None Diabetes: No  How often do you need to have someone help you when you read instructions, pamphlets, or other written materials from your doctor or pharmacy?: 1 - Never  Diabetic?No  Interpreter Needed?: No  Information entered by :: Laurel Dimmer, CMA   Activities of Daily Living    03/12/2023   10:34 AM  In your present state of health, do you have any difficulty performing the following activities:  Hearing? 0  Vision? 0  Comment [facility] [place], Dr. Anner Crete  Difficulty concentrating or making decisions? 0  Walking or climbing stairs? 0  Dressing or bathing? 0  Doing errands, shopping? 0    Patient Care Team: Levin Erp, MD as PCP - General (Family Medicine) Bufford Buttner, MD as Consulting Physician (Dermatology)  Indicate any recent Medical Services you may have received from other than [facility] providers in the past year (date may be approximate).     Assessment:   This is a routine wellness examination for Amber Allen.  Hearing/Vision screen Denies any hearing issues. Denies any vision changes. Annual Eye Exam Dr. Daleen Squibb  Dietary issues and exercise activities discussed: Current Exercise Habits: The patient does not participate in regular exercise at present, Exercise limited by: None identified   Goals Addressed   None    Depression Screen    03/12/2023   10:33 AM 02/16/2023    8:39 AM 02/12/2022    9:19 AM 10/20/2021    1:49 PM 03/17/2021    9:08 AM 01/30/2021    8:46 AM 01/19/2020    1:52 PM  PHQ 2/9 Scores  PHQ - 2 Score 0 0 0 0 0 0 0  PHQ- 9 Score  0 2 0 0 0     Fall Risk    03/12/2023   10:33 AM 02/16/2023    8:39 AM 02/12/2022    9:19 AM 01/08/2022    9:31 AM 04/16/2021    8:36 AM  Fall Risk    Falls in the past year? 1 1 1 1 1   Number falls in past yr: 1 1 0 1 0  Injury with Fall? 1 0 1 1 1   Risk for fall due to : Impaired balance/gait;Impaired mobility History of fall(s)     Follow up Falls evaluation completed Falls evaluation completed;Falls prevention discussed       FALL RISK PREVENTION PERTAINING TO THE HOME:  Any stairs in or around the home? No  If so, are there any without handrails? No  Home free of loose throw rugs in walkways, pet beds, electrical cords, etc? No  Adequate lighting in your home to reduce risk of falls? Yes   ASSISTIVE DEVICES UTILIZED TO PREVENT FALLS:  Life alert? No  Use of a cane, walker or w/c? No  Grab bars in the bathroom? Yes  Shower chair or bench in shower? No  Elevated toilet seat or a handicapped toilet? Yes   TIMED UP AND GO:  Was the test performed? Unable to perform, virtual appointment   Cognitive Function:        03/12/2023   10:38 AM 06/19/2019   11:43 AM  6CIT Screen  What Year? 0 points 0 points  What month? 0 points 0 points  What time? 0 points 0 points  Count back from 20 0 points 0 points  Months in reverse 0 points 0 points  Repeat phrase 2 points 0 points  Total Score 2 points 0 points    Immunizations Immunization History  Administered Date(s)  Administered   Influenza Split 09/09/2012   Influenza,inj,Quad PF,6+ Mos 07/10/2020   Moderna Sars-Covid-2 Vaccination 11/30/2019, 12/25/2019   PFIZER Comirnaty(Gray Top)Covid-19 Tri-Sucrose Vaccine 04/16/2021   Pneumococcal Conjugate-13 01/18/2015   Pneumococcal Polysaccharide-23 06/29/2007   Td 01/07/2005   Tdap 01/18/2015, 05/30/2016   Zoster, Live 12/07/2008    TDAP status: Up to date  Flu Vaccine status: Up to date  Pneumococcal vaccine status: Up to date  Covid-19 vaccine status: Information provided on how to obtain vaccines.   Qualifies for Shingles Vaccine? Yes   Zostavax completed Yes   Shingrix Completed?: No.    Education has been  provided regarding the importance of this vaccine. Patient has been advised to call insurance company to determine out of pocket expense if they have not yet received this vaccine. Advised may also receive vaccine at local pharmacy or Health Dept. Verbalized acceptance and understanding.  Screening Tests Health Maintenance  Topic Date Due   Zoster Vaccines- Shingrix (1 of 2) Never done   COVID-19 Vaccine (4 - 2023-24 season) 07/10/2022   INFLUENZA VACCINE  06/10/2023   Medicare Annual Wellness (AWV)  03/11/2024   DTaP/Tdap/Td (4 - Td or Tdap) 05/30/2026   Pneumonia Vaccine 81+ Years old  Completed   DEXA SCAN  Completed   HPV VACCINES  Aged Out    Health Maintenance  Health Maintenance Due  Topic Date Due   Zoster Vaccines- Shingrix (1 of 2) Never done   COVID-19 Vaccine (4 - 2023-24 season) 07/10/2022     Colorectal cancer screening: No longer required.  Mammogram status: No longer required due to age.  DEXA Scan: 02/11/2018   Lung Cancer Screening: (Low Dose CT Chest recommended if Age 59-80 years, 30 pack-year currently smoking OR have quit w/in 15years.) does not qualify.   Lung Cancer Screening Referral: not applicable   Additional Screening:  Hepatitis C Screening: does not qualify;   Vision Screening: Recommended annual ophthalmology exams for early detection of glaucoma and other disorders of the eye. Is the patient up to date with their annual eye exam?  Yes  Who is the provider or what is the name of the office in which the patient attends annual eye exams? Dr. Daleen Squibb  If pt is not established with a provider, would they like to be referred to a provider to establish care? No .   Dental Screening: Recommended annual dental exams for proper oral hygiene  Community Resource Referral / Chronic Care Management: CRR required this visit?  No   CCM required this visit?  No      Plan:     I have personally reviewed and noted the following in the patient's chart:    Medical and social history Use of alcohol, tobacco or illicit drugs  Current medications and supplements including opioid prescriptions. Patient is not currently taking opioid prescriptions. Functional ability and status Nutritional status Physical activity Advanced directives List of other physicians Hospitalizations, surgeries, and ER visits in previous 12 months Vitals Screenings to include cognitive, depression, and falls Referrals and appointments  In addition, I have reviewed and discussed with patient certain preventive protocols, quality metrics, and best practice recommendations. A written personalized care plan for preventive services as well as general preventive health recommendations were provided to patient.    Ms. Revolorio , Thank you for taking time to come for your Medicare Wellness Visit. I appreciate your ongoing commitment to your health goals. Please review the following plan we discussed and let me know if I  can assist you in the future.   These are the goals we discussed:  Goals       Exercise 3x per week (15 min twice daily) (pt-stated)      Walking      Weight (lb) < 162 lb (73.5 kg)        This is a list of the screening recommended for you and due dates:  Health Maintenance  Topic Date Due   Zoster (Shingles) Vaccine (1 of 2) Never done   COVID-19 Vaccine (4 - 2023-24 season) 07/10/2022   Flu Shot  06/10/2023   Medicare Annual Wellness Visit  03/11/2024   DTaP/Tdap/Td vaccine (4 - Td or Tdap) 05/30/2026   Pneumonia Vaccine  Completed   DEXA scan (bone density measurement)  Completed   HPV Vaccine  Aged [address] [place], [place]   03/12/2023   Nurse Notes: Approximately 30 minute Non-Face -To-Face Medicare Wellness Visit

## 2023-04-23 ENCOUNTER — Other Ambulatory Visit: Payer: Self-pay | Admitting: Student

## 2023-04-23 ENCOUNTER — Other Ambulatory Visit (HOSPITAL_COMMUNITY): Payer: Self-pay

## 2023-04-23 MED ORDER — LEVOTHYROXINE SODIUM 100 MCG PO TABS: ORAL_TABLET | ORAL | 0 refills | Status: DC

## 2023-05-06 ENCOUNTER — Other Ambulatory Visit: Payer: Self-pay | Admitting: Student

## 2023-05-06 ENCOUNTER — Other Ambulatory Visit (HOSPITAL_COMMUNITY): Payer: Self-pay

## 2023-05-06 ENCOUNTER — Telehealth: Payer: Self-pay | Admitting: Family Medicine

## 2023-05-06 MED ORDER — ATORVASTATIN CALCIUM 40 MG PO TABS: ORAL_TABLET | ORAL | 3 refills | Status: DC

## 2023-05-06 MED ORDER — LEVOTHYROXINE SODIUM 100 MCG PO TABS: ORAL_TABLET | ORAL | 0 refills | Status: DC

## 2023-05-06 NOTE — Telephone Encounter (Signed)
Patient returns call to nurse line.   Patient advised of BP medication recommendation.   She reports she is fine with not restarting.   Patient will call for a clinic visit if she decides otherwise.

## 2023-05-06 NOTE — Telephone Encounter (Signed)
VM left for patient to call back to discuss.

## 2023-05-06 NOTE — Telephone Encounter (Signed)
Error

## 2023-05-06 NOTE — Telephone Encounter (Signed)
Patient calls nurse line requesting medication refills.   She reports difficulty getting several prescriptions.   It appears Levothyroxine was sent to a mail order pharmacy on 6/14. However, patient reports she does not use them anymore.   She requests Levothyroxine and Atorvastatin to [facility] [place].   She also requests to restart her blood pressure medication. She reports she discussed this with PCP at recent physical exam.   Will forward to PCP.

## 2023-06-03 ENCOUNTER — Other Ambulatory Visit: Payer: Self-pay

## 2023-06-03 MED ORDER — LEVOTHYROXINE SODIUM 100 MCG PO TABS: ORAL_TABLET | ORAL | 0 refills | Status: DC

## 2023-06-03 NOTE — Telephone Encounter (Signed)
Patient LVM on nurse line requesting refill on Levothyroxine.  Returned call to patient.   She reports that she is out of state in [place]. She will be there until the end of the month.   She has been out of medication since Monday.   She is [facility] [address], [place], [place] [address].   I have pended refill request to this encounter.   Please advise.   Veronda Prude, RN

## 2023-06-29 ENCOUNTER — Emergency Department (HOSPITAL_COMMUNITY): Payer: PPO

## 2023-06-29 ENCOUNTER — Observation Stay (HOSPITAL_COMMUNITY)
Admission: EM | Admit: 2023-06-29 | Discharge: 2023-07-01 | Disposition: A | Discharge status: Home / Self Care | Payer: PPO | Attending: Family Medicine | Admitting: Family Medicine

## 2023-06-29 ENCOUNTER — Other Ambulatory Visit: Payer: Self-pay

## 2023-06-29 ENCOUNTER — Encounter (HOSPITAL_COMMUNITY): Payer: Self-pay | Admitting: Family Medicine

## 2023-06-29 DIAGNOSIS — E039 Hypothyroidism, unspecified: Secondary | ICD-10-CM | POA: Diagnosis not present

## 2023-06-29 DIAGNOSIS — I639 Cerebral infarction, unspecified: Secondary | ICD-10-CM | POA: Diagnosis not present

## 2023-06-29 DIAGNOSIS — R4781 Slurred speech: Secondary | ICD-10-CM | POA: Diagnosis not present

## 2023-06-29 DIAGNOSIS — R41841 Cognitive communication deficit: Secondary | ICD-10-CM | POA: Insufficient documentation

## 2023-06-29 DIAGNOSIS — I63412 Cerebral infarction due to embolism of left middle cerebral artery: Secondary | ICD-10-CM

## 2023-06-29 DIAGNOSIS — Z79890 Hormone replacement therapy (postmenopausal): Secondary | ICD-10-CM | POA: Diagnosis not present

## 2023-06-29 DIAGNOSIS — I10 Essential (primary) hypertension: Secondary | ICD-10-CM | POA: Diagnosis not present

## 2023-06-29 DIAGNOSIS — Z66 Do not resuscitate: Secondary | ICD-10-CM | POA: Diagnosis not present

## 2023-06-29 DIAGNOSIS — R29810 Facial weakness: Secondary | ICD-10-CM | POA: Insufficient documentation

## 2023-06-29 DIAGNOSIS — G8191 Hemiplegia, unspecified affecting right dominant side: Secondary | ICD-10-CM | POA: Diagnosis not present

## 2023-06-29 DIAGNOSIS — G459 Transient cerebral ischemic attack, unspecified: Secondary | ICD-10-CM | POA: Diagnosis not present

## 2023-06-29 DIAGNOSIS — R42 Dizziness and giddiness: Secondary | ICD-10-CM | POA: Diagnosis not present

## 2023-06-29 DIAGNOSIS — E7800 Pure hypercholesterolemia, unspecified: Secondary | ICD-10-CM | POA: Insufficient documentation

## 2023-06-29 DIAGNOSIS — Z79899 Other long term (current) drug therapy: Secondary | ICD-10-CM | POA: Insufficient documentation

## 2023-06-29 DIAGNOSIS — R29818 Other symptoms and signs involving the nervous system: Secondary | ICD-10-CM | POA: Diagnosis not present

## 2023-06-29 HISTORY — DX: Cerebral infarction, unspecified: I63.9

## 2023-06-29 LAB — COMPREHENSIVE METABOLIC PANEL
ALT: 18 U/L (ref 0–44)
AST: 23 U/L (ref 15–41)
Albumin: 3.7 g/dL (ref 3.5–5.0)
Alkaline Phosphatase: 79 U/L (ref 38–126)
Anion gap: 10 (ref 5–15)
BUN: 12 mg/dL (ref 8–23)
CO2: 24 mmol/L (ref 22–32)
Calcium: 9.3 mg/dL (ref 8.9–10.3)
Chloride: 104 mmol/L (ref 98–111)
Creatinine, Ser: 0.82 mg/dL (ref 0.44–1.00)
GFR, Estimated: >60 mL/min (ref >60)
Glucose, Bld: 78 mg/dL (ref 70–99)
Potassium: 3.9 mmol/L (ref 3.5–5.1)
Sodium: 138 mmol/L (ref 135–145)
Total Bilirubin: 0.9 mg/dL (ref 0.3–1.2)
Total Protein: 6.7 g/dL (ref 6.5–8.1)

## 2023-06-29 LAB — CBC
HCT: 41.4 % (ref 36.0–46.0)
Hemoglobin: 13.6 g/dL (ref 12.0–15.0)
MCH: 32.3 pg (ref 26.0–34.0)
MCHC: 32.9 g/dL (ref 30.0–36.0)
MCV: 98.3 fL (ref 80.0–100.0)
Platelets: 292 10*3/uL (ref 150–400)
RBC: 4.21 MIL/uL (ref 3.87–5.11)
RDW: 12.8 % (ref 11.5–15.5)
WBC: 8.9 10*3/uL (ref 4.0–10.5)
nRBC: 0 % (ref 0.0–0.2)

## 2023-06-29 LAB — DIFFERENTIAL
Abs Immature Granulocytes: 0.02 10*3/uL (ref 0.00–0.07)
Basophils Absolute: 0.1 10*3/uL (ref 0.0–0.1)
Basophils Relative: 1 %
Eosinophils Absolute: 0.2 10*3/uL (ref 0.0–0.5)
Eosinophils Relative: 2 %
Immature Granulocytes: 0 %
Lymphocytes Relative: 22 %
Lymphs Abs: 2 10*3/uL (ref 0.7–4.0)
Monocytes Absolute: 0.9 10*3/uL (ref 0.1–1.0)
Monocytes Relative: 10 %
Neutro Abs: 5.8 10*3/uL (ref 1.7–7.7)
Neutrophils Relative %: 65 %

## 2023-06-29 LAB — HEMOGLOBIN A1C
Hgb A1c MFr Bld: 5.6 % (ref 4.8–5.6)
Mean Plasma Glucose: 114.02 mg/dL

## 2023-06-29 LAB — RAPID URINE DRUG SCREEN, HOSP PERFORMED
Amphetamines: NOT DETECTED
Barbiturates: NOT DETECTED
Benzodiazepines: NOT DETECTED
Cocaine: NOT DETECTED
Opiates: NOT DETECTED
Tetrahydrocannabinol: NOT DETECTED

## 2023-06-29 LAB — ETHANOL: Alcohol, Ethyl (B): <10 mg/dL (ref <10)

## 2023-06-29 LAB — TSH: TSH: 0.355 u[IU]/mL (ref 0.350–4.500)

## 2023-06-29 MED ORDER — ENOXAPARIN SODIUM 40 MG/0.4ML IJ SOSY
40.0000 mg | PREFILLED_SYRINGE | INTRAMUSCULAR | Status: DC
  Administered 2023-06-30 – 2023-07-01 (×2): 40 mg via SUBCUTANEOUS
  Filled 2023-06-29 (×2): qty 0.4

## 2023-06-29 MED ORDER — IOHEXOL 350 MG/ML SOLN
75.0000 mL | Freq: Once | INTRAVENOUS | Status: AC | PRN
  Administered 2023-06-29: 75 mL via INTRAVENOUS

## 2023-06-29 MED ORDER — STROKE: EARLY STAGES OF RECOVERY BOOK
Freq: Once | Status: AC
  Filled 2023-06-29 (×2): qty 1

## 2023-06-29 MED ORDER — CLOPIDOGREL BISULFATE 75 MG PO TABS
75.0000 mg | ORAL_TABLET | Freq: Every day | ORAL | Status: DC
  Administered 2023-06-30 – 2023-07-01 (×2): 75 mg via ORAL
  Filled 2023-06-29 (×2): qty 1

## 2023-06-29 MED ORDER — LEVOTHYROXINE SODIUM 100 MCG PO TABS
100.0000 ug | ORAL_TABLET | Freq: Every day | ORAL | Status: DC
  Administered 2023-06-30 – 2023-07-01 (×2): 100 ug via ORAL
  Filled 2023-06-29 (×2): qty 1

## 2023-06-29 MED ORDER — CLOPIDOGREL BISULFATE 300 MG PO TABS
300.0000 mg | ORAL_TABLET | Freq: Once | ORAL | Status: AC
  Administered 2023-06-29: 300 mg via ORAL
  Filled 2023-06-29: qty 1

## 2023-06-29 MED ORDER — ASPIRIN 325 MG PO TABS: 325.0000 mg | ORAL_TABLET | Freq: Every day | ORAL | Status: DC

## 2023-06-29 MED ORDER — ASPIRIN 325 MG PO TABS
650.0000 mg | ORAL_TABLET | Freq: Once | ORAL | Status: AC
  Administered 2023-06-29: 650 mg via ORAL
  Filled 2023-06-29: qty 2

## 2023-06-29 MED ORDER — ATORVASTATIN CALCIUM 80 MG PO TABS
80.0000 mg | ORAL_TABLET | Freq: Every day | ORAL | Status: DC
  Administered 2023-06-30 – 2023-07-01 (×2): 80 mg via ORAL
  Filled 2023-06-29 (×2): qty 1
  Filled 2023-06-29: qty 2

## 2023-06-29 NOTE — Consult Note (Signed)
Neurology Consultation  Reason for Consult: stroke Referring Physician: Dr Rhunette Croft  CC: Difficulty with speech  History is obtained from: Patient, chart  HPI: Amber Allen is a 82 y.o. female past medical history of hypertension, hyperlipidemia, alcohol abuse in the past, vitamin D deficiency, hypothyroidism who is off of all her medications for about a year except for her thyroid meds, presented to the emergency department for evaluation of sudden onset of speech difficulty.  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  She was having trouble with finding words.  She denies any tingling numbness or weakness.  Initial ED report mentions leaning to the right and left-sided facial droop but she denies any discernible facial weakness.  Symptoms lasted about 45 minutes and resolved prior to arrival to the emergency department.  She was seen and evaluated by the ED.  MRI imaging of the brain ordered which revealed a stroke-details below. Neurology was consulted.  She reports that she had taken herself off of all her medications about a year ago after her brother passed away with the exception of her thyroid medications.  She does have a primary care and follows with them regularly.  She has an esophageal diverticulum and has issues with choking for which she is supposed to see ENT.  She reports a prior history of stroke that was discovered on a CT head-she did not have any symptoms.  LKW: 12 PM IV thrombolysis given?: no, symptoms resolved on arrival-code stroke was not activated. Premorbid modified Rankin scale (mRS): 0   ROS: Full ROS was performed and is negative except as noted in the HPI. 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   Cholecystolithiasis    De Quervain's tenosynovitis    De Quervain's tenosynovitis, left 09/09/2012   Depression    DIVERTICULITIS, HX OF 06/27/2008    Diverticulosis    Hyperlipidemia    Hypertension    Substance abuse (HCC)    Alcohol    Thyroid disease    Unspecified vitamin D deficiency 12/12/2008    Family History  Problem Relation Age of Onset   Hepatitis Brother        hep C   Hearing loss Brother    Arthritis Brother    Heart failure Father        MI-65   Hypertension Father    Alcoholism Father    Hyperlipidemia Father    Heart disease Father    Alcohol abuse Father    Stroke Father    Heart failure Mother        51   Alcoholism Mother    Heart disease Mother    Alcohol abuse Mother     Social History:   reports that she has never smoked. She has never used smokeless tobacco. She reports that she does not currently use alcohol after a past usage of about 21.0 standard drinks of alcohol per week. She reports that she does not use drugs.  Medications No current facility-administered medications for this encounter.  Current Outpatient Medications:    aspirin EC 81 MG tablet, Take 1 tablet (81 mg total) by mouth daily. Swallow whole. (Patient not taking: Reported on 03/12/2023), Disp: 30 tablet, Rfl: 12   atorvastatin (LIPITOR) 40 MG tablet, TAKE 1 TABLET EVERY DAY, Disp: 90 tablet, Rfl: 3   levothyroxine (SYNTHROID) 100 MCG tablet, TAKE 1 TABLET EVERY  DAY BEFORE BREAKFAST, Disp: 30 tablet, Rfl: 0   Multiple Vitamin (MULTIVITAMIN WITH MINERALS) TABS tablet, Take 1 tablet by mouth daily., Disp: , Rfl:    Multiple Vitamins-Minerals (PRESERVISION AREDS 2 PO), Take by mouth., Disp: , Rfl:   Exam: Current vital signs: BP 130/68   Pulse 74   Temp 98.3 F (36.8 C) (Oral)   Resp 13   Wt 72.6 kg   SpO2 99%   BMI 25.82 kg/m  Vital signs in last 24 hours: Temp:  [98.3 F (36.8 C)] 98.3 F (36.8 C) (08/20 1437) Pulse Rate:  [73-79] 74 (08/20 1849) Resp:  [13-29] 13 (08/20 1730) BP: (109-140)/(68-88) 130/68 (08/20 1849) SpO2:  [95 %-99 %] 99 % (08/20 1849) Weight:  [72.6 kg] 72.6 kg (08/20 1438) General: Awake  alert in no distress HEENT: Normocephalic atraumatic Lungs: Clear Cardiovascular: Regular rhythm Abdomen nondistended nontender Neurological exam She is awake alert oriented x 3 Speech is not dysarthric She does not have a gross aphasia but upon having longer conversations she does have an off-and-on word finding difficulty with a few words which is very subtle. Cranial nerves II to XII intact Motor examination with no drift in any of the 4 extremities Sensation intact to light touch without extinction.  No visual extinction to double simultaneous stimulation. Coordination intact NIH stroke scale-1 for aphasia  Labs I have reviewed labs in epic and the results pertinent to this consultation are: CBC    Component Value Date/Time   WBC 8.9 06/29/2023 1620   RBC 4.21 06/29/2023 1620   HGB 13.6 06/29/2023 1620   HGB 13.1 02/16/2023 1111   HCT 41.4 06/29/2023 1620   HCT 39.7 02/16/2023 1111   PLT 292 06/29/2023 1620   PLT 305 02/16/2023 1111   MCV 98.3 06/29/2023 1620   MCV 97 02/16/2023 1111   MCH 32.3 06/29/2023 1620   MCHC 32.9 06/29/2023 1620   RDW 12.8 06/29/2023 1620   RDW 10.9 (L) 02/16/2023 1111   LYMPHSABS 2.0 06/29/2023 1620   MONOABS 0.9 06/29/2023 1620   EOSABS 0.2 06/29/2023 1620   BASOSABS 0.1 06/29/2023 1620    CMP     Component Value Date/Time   NA 138 06/29/2023 1620   NA 138 02/16/2023 1111   K 3.9 06/29/2023 1620   CL 104 06/29/2023 1620   CO2 24 06/29/2023 1620   GLUCOSE 78 06/29/2023 1620   BUN 12 06/29/2023 1620   BUN 9 02/16/2023 1111   CREATININE 0.82 06/29/2023 1620   CREATININE 0.75 01/22/2017 1029   CALCIUM 9.3 06/29/2023 1620   PROT 6.7 06/29/2023 1620   PROT 6.6 02/16/2023 1111   ALBUMIN 3.7 06/29/2023 1620   ALBUMIN 4.1 02/16/2023 1111   AST 23 06/29/2023 1620   ALT 18 06/29/2023 1620   ALKPHOS 79 06/29/2023 1620   BILITOT 0.9 06/29/2023 1620   BILITOT 0.5 02/16/2023 1111   GFRNONAA >60 06/29/2023 1620   GFRNONAA 78 01/22/2017  1029   GFRAA 71 07/12/2019 1256   GFRAA >89 01/22/2017 1029    Lipid Panel     Component Value Date/Time   CHOL 273 (H) 04/16/2021 0915   TRIG 171 (H) 04/16/2021 0915   HDL 79 04/16/2021 0915   CHOLHDL 3.5 04/16/2021 0915   CHOLHDL 2.8 01/22/2017 1029   VLDL 23 01/22/2017 1029   LDLCALC 164 (H) 04/16/2021 0915   LDLDIRECT 114 (H) 05/18/2011 1034    Lab Results  Component Value Date   HGBA1C 5.2 01/17/2016  Imaging I have reviewed the images obtained:  CT-head and CT angiography head and neck: No hemorrhage or CT evidence of acute cortical infarct.  Chronic appearing right cerebellar infarct.  Moderate to severe focal narrowing in the left M3 segment.  Chronically occluded distal right PICA.  No hemodynamically significant stenosis in the neck.  MRI of the brain without contrast-multiple foci of acute infarction along the left central sulcus notably in the right hand motor cortex area.  No acute hemorrhage or significant mass effect.  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  Assessment:  82 year old with above past medical history who is only on her thyroid medications presented for evaluation of sudden onset of word finding difficulty and possible leaning to the right and possible facial droop that lasted about 45 minutes and nearly resolved completely upon arrival.  Code stroke not activated due to resolution of symptoms. MRI of the brain revealed scattered foci of infarction along, motor cortex area.  CT angiography shows focal narrowing in the left M3 segment which might be the culprit vessel.  Cardioembolic source should also be evaluated.  Impression: Acute ischemic stroke-etiology under investigation.  Recommendations: Admit to hospitalist Frequent neurochecks Telemetry Aspirin 650+ Plavix 300 load x 1 From tomorrow start aspirin 325 + Plavix 75 daily for 3 months Atorvastatin 80 now and daily 2D echo A1c Lipid panel PT OT Speech therapy She has passed bedside  swallow screen-can get diet-heart healthy carb modified Permissive hypertension-allow for blood pressure to be as high as 220 for another day or 2 before normalizing blood pressures.  Treat only if systolic is greater than 220 on a as needed basis. Plan discussed with Dr. Silverio Lay in the ED. Plan also discussed with the patient. -- Milon Dikes, MD Neurologist [facility] Pager: [phone]

## 2023-06-29 NOTE — Assessment & Plan Note (Signed)
-   continue synthroid - TSH (most recent 1.77)

## 2023-06-29 NOTE — ED Notes (Signed)
Clarified mg ASA dose with MD. Dorette Grate to give the dose.

## 2023-06-29 NOTE — Assessment & Plan Note (Signed)
Pt was taking 40mg  daily at home. - increase statin to 80mg  - lipid panel

## 2023-06-29 NOTE — ED Provider Notes (Signed)
[place] EMERGENCY DEPARTMENT AT [facility] Provider Note   CSN: [id number] Arrival date & time: 06/29/23  1412     History {Add pertinent medical, surgical, social history, OB history to HPI:1} Chief Complaint  Patient presents with   Facial Droop    Onset around 1230 with left sided facial droop slurred speech and leaning to the right. Pt symptoms resolved within 45 minuytes and prior to arrival. NIHSS upon arrival is zero. No thinners. No ASA. Hx of strokes. On synthroid. Stopped HTN meds 4 months due to bp normalizing. Ems reports pt was hypertensive pta. 170s systolic     Amber Allen is a 82 y.o. female.  HPI    82 year old female comes in with chief complaint of facial droop Home Medications Prior to Admission medications   Medication Sig Start Date End Date Taking? Authorizing Provider  aspirin EC 81 MG tablet Take 1 tablet (81 mg total) by mouth daily. Swallow whole. Patient not taking: Reported on 03/12/2023 02/16/23   Levin Erp, MD  atorvastatin (LIPITOR) 40 MG tablet TAKE 1 TABLET EVERY DAY 05/06/23   Levin Erp, MD  levothyroxine (SYNTHROID) 100 MCG tablet TAKE 1 TABLET EVERY DAY BEFORE BREAKFAST 06/03/23   Levin Erp, MD  Multiple Vitamin (MULTIVITAMIN WITH MINERALS) TABS tablet Take 1 tablet by mouth daily.    [provider]  Multiple Vitamins-Minerals (PRESERVISION AREDS 2 PO) Take by mouth.    [provider]      Allergies    Brimonidine tartrate    Review of Systems   Review of Systems  Physical Exam Updated Vital Signs BP 109/88   Pulse 77   Temp 98.3 F (36.8 C) (Oral)   Resp (!) 21   Wt 72.6 kg   SpO2 95%   BMI 25.82 kg/m  Physical Exam  ED Results / Procedures / Treatments   Labs (all labs ordered are listed, but only abnormal results are displayed) Labs Reviewed  CBC  DIFFERENTIAL  COMPREHENSIVE METABOLIC PANEL  ETHANOL  RAPID URINE DRUG SCREEN, HOSP PERFORMED    EKG EKG  Interpretation Date/Time:  Tuesday June 29 2023 14:41:14 EDT Ventricular Rate:  80 PR Interval:  162 QRS Duration:  102 QT Interval:  360 QTC Calculation: 416 R Axis:   71  Text Interpretation: Sinus rhythm No acute changes No significant change since last tracing Confirmed by Derwood Kaplan [phone]) on 06/29/2023 4:10:47 PM  Radiology No results found.  Procedures Procedures  {Document cardiac monitor, telemetry assessment procedure when appropriate:1}  Medications Ordered in ED Medications - No data to display  ED Course/ Medical Decision Making/ A&P   {   Click here for ABCD2, HEART and other calculatorsREFRESH Note before signing :1}                              Medical Decision Making Amount and/or Complexity of Data Reviewed Labs: ordered. Radiology: ordered.   ***  {Document critical care time when appropriate:1} {Document review of labs and clinical decision tools ie heart score, Chads2Vasc2 etc:1}  {Document your independent review of radiology images, and any outside records:1} {Document your discussion with family members, caretakers, and with consultants:1} {Document social determinants of health affecting pt's care:1} {Document your decision making why or why not admission, treatments were needed:1} Final Clinical Impression(s) / ED Diagnoses Final diagnoses:  None    Rx / DC Orders ED Discharge Orders     None

## 2023-06-29 NOTE — ED Provider Notes (Signed)
  Physical Exam  BP 130/68   Pulse 74   Temp 98.3 F (36.8 C) (Oral)   Resp 13   Wt 72.6 kg   SpO2 99%   BMI 25.82 kg/m   Physical Exam  Procedures  Procedures  ED Course / MDM    Medical Decision Making Care assumed at 4 PM.  Patient is here with trouble speaking.  Neurology was consulted and recommended CTA and MRI.  7:44 PM MRI showed acute infarct on the left central sulcus.  Neurology recommend admission for stroke workup.  Family practice to admit   Problems Addressed: Cerebrovascular accident (CVA), unspecified mechanism (HCC): acute illness or injury  Amount and/or Complexity of Data Reviewed Labs: ordered. Decision-making details documented in ED Course. Radiology: ordered and independent interpretation performed. Decision-making details documented in ED Course. ECG/medicine tests: ordered and independent interpretation performed. Decision-making details documented in ED Course.  Risk Prescription drug management. Decision regarding hospitalization.          Charlynne Pander, MD 06/29/23 [phone]

## 2023-06-29 NOTE — ED Notes (Signed)
Pt ambulated to unit with NAD with off going RN; Pt lying in bed with unlabored breathing-Monique,RN

## 2023-06-29 NOTE — Assessment & Plan Note (Addendum)
Presented with word finding difficulty that began 8/20 at 12 PM. MRI with multiple foci of acute infarction along the left central sulcus, notably in the right hand motor cortex area, no acute hemorrhage or significant mass effect. Subjective right hand neglect and mild weakness on exam. S/p ASA 650, plavix 300 load. A1c 5.6. -MedSurg, observation status -Appreciate ongoing neurology recommendations -Statin as below -Plavix 75 mg and aspirin 325mg  daily -Echocardiogram -LE DVT ultrasound -Lipid panel -Cardiac telemetry -Neurochecks every 4 hours -PT/OT eval -SLP eval

## 2023-06-29 NOTE — H&P (Cosign Needed Addendum)
Hospital Admission History and Physical Service Pager: [phone]  Patient name: PA COLMENERO Medical record number: [id number] Date of Birth: 23-Jul-1941 Age: 82 y.o. Gender: female  Primary Care Provider: Levin Erp, MD Consultants: neurology Code Status: DNR  Chief Complaint: speech difficulty (word finding)  Assessment and Plan: Amber Allen is a 82 y.o. female presenting with word finding difficulty and transient facial droop, now resolved. Found to have acute CVA on MRI. No other cognitive or neurologic deficits noted on exam. Pt's daughter at bedside reports that pt is at baseline cognitive function.  She is quite healthy and independent at baseline. Lives alone. Only home meds are atorvastatin and levothyroxine.   -      Hospital     * (Principal) Stroke [facility])     Presented with word finding difficulty that began 8/20 at 12 PM. MRI  with multiple foci of acute infarction along the left central sulcus,  notably in the right hand motor cortex area, no acute hemorrhage or  significant mass effect. No correlated right hand weakness to my exam.  -MedSurg, observation status -Appreciate neurology recommendations -ASA 650, plavix 300 load x1 -atorvastatin 80mg  now and daily -8/21 start Plavix 75 mg daily, aspirin 325mg  daily -Echocardiogram -A1c, lipid panel, TSH -Cardiac telemetry -Neurochecks every 4 hours -PT/OT eval -SLP eval        Hypothyroidism     - continue synthroid - TSH (most recent 1.77)        Hypercholesterolemia     Pt was taking atorvastatin 40mg  daily at home. - increase atorvastatin to 80mg  - lipid panel pending        Chronic and Stable Problems:  HTN: pt is not taking any medications for this Alcohol use disorder: previously in remission with AA, now drinks 1 bottle wine on weekends  FEN/GI: regular VTE Prophylaxis: lovenox  Disposition: med-surg, observation  History of Present Illness:  Amber Allen is a 82 y.o.  female presenting with sudden onset of speech difficulty, LKN 12pm 06/29/23. 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 States her symptoms lasted around 45 minutes and improved prior to arriving to the ED. Pt denies focal weakness, LOC, chest pain, shortness of breath. Denies any other neurological deficits. Pt notes hx of prior stroke that was incidentally found on a CT scan, states that she was not evaluated and did not know about it.  States that she does not take aspirin or blood thinners. She stopped her HTN medications around 4 months ago due to her BP normalizing.   In the ED, pt had left sided facial droop and was reported to be leaning to the right. Pt was not given TPA because her symptoms reportedly improved on arrival. MRI brain showed Multiple foci of acute infarction along the left central sulcus, notably in the right hand motor cortex area. No acute hemorrhage or significant mass effect. 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 CTA showed Moderate to severe focal narrowing in an M3 segment of the left MCA, Chronically occluded distal right PICA, no intracranial large vessel occlusion, and no hemodynamically significant stenosis in the neck. Neurology was consulted and evaluated pt. Recommended admission for stroke work up.   Review Of Systems: Per HPI   Pertinent Past Medical History: HTN, HLD, hypothyroidism, alcohol use disorder Remainder reviewed in history tab.   Pertinent Past Surgical History: Appendectomy, breast biopsy, hysterectomy  Remainder reviewed in history tab.   Pertinent Social  History: Tobacco use: none Alcohol use: 1 bottle of wine on the weekends Other Substance use: none Lives alone  Pertinent Family History: Mother - heart failure, alcoholism Father - heart failure, HTN, HLD, stroke  Remainder reviewed in history tab.   Important Outpatient Medications: Atorvastatin 40mg  daily Levothyroxine daily Remainder reviewed  in medication history.   Objective: BP 126/61   Pulse 76   Temp 98.3 F (36.8 C) (Oral)   Resp 13   Wt 72.6 kg   SpO2 96%   BMI 25.82 kg/m  Exam: General: Well appearing, no acute distress, using phone Eyes: EOMI, pupils appear equal ENTM: MMM, no facial asymmetry noted Neck: full ROM Cardiovascular: RRR, no murmurs Respiratory: CTAB, no increased work of breathing MSK: Pt able to stand up, walk across the room, and dance independently. No focal weakness noted on exam.  Neuro: A&Ox3. Pt does have intermittent word finding difficulty. No facial droop. No focal weakness noted. Psych: Normal affect.  Labs:  CBC BMET  Recent Labs  Lab 06/29/23 1620  WBC 8.9  HGB 13.6  HCT 41.4  PLT 292   Recent Labs  Lab 06/29/23 1620  NA 138  K 3.9  CL 104  CO2 24  BUN 12  CREATININE 0.82  GLUCOSE 78  CALCIUM 9.3     EKG: Normal sinus rhythm. Qtc 416 ms.    Imaging Studies Performed:  MR Brain IMPRESSION: 1. Multiple foci of acute infarction along the left central sulcus, notably in the right hand motor cortex area. No acute hemorrhage or significant mass effect. 2. 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  CT Angio Head Neck IMPRESSION: 1. No hemorrhage or CT evidence of an acute cortical infarct. Chronic appearing right cerebellar infarct. 2. Moderate to severe focal narrowing in an M3 segment of the left MCA. 3. Chronically occluded distal right PICA. 4. No intracranial large vessel occlusion. 5. No hemodynamically significant stenosis in the neck.  CT Head IMPRESSION: 1. No hemorrhage or CT evidence of an acute cortical infarct. Chronic appearing right cerebellar infarct. 2. Moderate to severe focal narrowing in an M3 segment of the left MCA. 3. Chronically occluded distal right PICA. 4. No intracranial large vessel occlusion. 5. No hemodynamically significant stenosis in the neck.   I personally reviewed and agree with radiologist interpretation.    Carlene Coria, DO 06/29/2023, 8:57 PM PGY-1, [facility]  FPTS Intern pager: [phone], text pages welcome Secure chat group [facility] Teaching Service

## 2023-06-29 NOTE — ED Notes (Signed)
Pt up to the bathroom at this time. Pt was able to ambulate to the bathroom. Upon return to pts ed room pt became dizzy and was placed back into the hospital stretcher.

## 2023-06-29 NOTE — Assessment & Plan Note (Addendum)
Presented with word finding difficulty that began 8/20 at 12 PM. MRI with multiple foci of acute infarction along the left central sulcus, notably in the right hand motor cortex area, no acute hemorrhage or significant mass effect.  -Admitted to MedSurg, observation -Appreciate neurology recommendations -ASA 650, plavix 300 load x1 -atorvastatin 80mg  now and daily -8/21 start Plavix 75 mg daily, aspirin 325mg  daily -Echocardiogram -A1c, lipid panel, TSH -Cardiac telemetry -Neurochecks every 4 hours -PT/OT eval -SLP eval

## 2023-06-29 NOTE — Assessment & Plan Note (Signed)
Pt was taking atorvastatin 40mg  daily at home. - increase atorvastatin to 80mg  - lipid panel pending

## 2023-06-30 ENCOUNTER — Observation Stay (HOSPITAL_BASED_OUTPATIENT_CLINIC_OR_DEPARTMENT_OTHER): Payer: PPO

## 2023-06-30 ENCOUNTER — Encounter (HOSPITAL_COMMUNITY): Payer: Self-pay | Admitting: Family Medicine

## 2023-06-30 ENCOUNTER — Observation Stay (HOSPITAL_COMMUNITY): Payer: PPO

## 2023-06-30 DIAGNOSIS — I5030 Unspecified diastolic (congestive) heart failure: Secondary | ICD-10-CM

## 2023-06-30 DIAGNOSIS — I63412 Cerebral infarction due to embolism of left middle cerebral artery: Secondary | ICD-10-CM | POA: Diagnosis not present

## 2023-06-30 DIAGNOSIS — I639 Cerebral infarction, unspecified: Secondary | ICD-10-CM

## 2023-06-30 LAB — ECHOCARDIOGRAM COMPLETE BUBBLE STUDY
AR max vel: 2.78 cm2
AV Area VTI: 2.61 cm2
AV Area mean vel: 2.51 cm2
AV Mean grad: 5 mmHg
AV Peak grad: 9.2 mmHg
Ao pk vel: 1.52 m/s
Area-P 1/2: 3.89 cm2
S' Lateral: 2.8 cm
Weight: 2560 [oz_av]

## 2023-06-30 LAB — LIPID PANEL
Cholesterol: 127 mg/dL (ref 0–200)
HDL: 51 mg/dL (ref >40)
LDL Cholesterol: 65 mg/dL (ref 0–99)
Total CHOL/HDL Ratio: 2.5 ratio
Triglycerides: 53 mg/dL (ref <150)
VLDL: 11 mg/dL (ref 0–40)

## 2023-06-30 MED ORDER — ASPIRIN 81 MG PO TBEC
81.0000 mg | DELAYED_RELEASE_TABLET | Freq: Every day | ORAL | Status: DC
  Administered 2023-06-30 – 2023-07-01 (×2): 81 mg via ORAL
  Filled 2023-06-30 (×2): qty 1

## 2023-06-30 NOTE — TOC Initial Note (Signed)
Transition of Care [facility]) - Initial/Assessment Note    Patient Details  Name: Amber Allen MRN: [id number] Date of Birth: Jul 01, 1941  Transition of Care [facility]) CM/SW Contact:    Kermit Balo, RN Phone Number: 06/30/2023, 4:05 PM  Clinical Narrative:                 Pt is from home alone but states her neighbors and daughter can check on her.  No DME at home.  She drives herself and manages her own medications without any issues.  No f/u per PT/OT.  TOC following.  Expected Discharge Plan: Home/Self Care Barriers to Discharge: Continued Medical Work up   Patient Goals and CMS Choice            Expected Discharge Plan and Services   Discharge Planning Services: CM Consult   Living arrangements for the past 2 months: Apartment                                      Prior Living Arrangements/Services Living arrangements for the past 2 months: Apartment Lives with:: Self Patient language and need for interpreter reviewed:: Yes Do you feel safe going back to the place where you live?: Yes            Criminal Activity/Legal Involvement Pertinent to Current Situation/Hospitalization: No - Comment as needed  Activities of Daily Living Home Assistive Devices/Equipment: Eyeglasses ADL Screening (condition at time of admission) Patient's cognitive ability adequate to safely complete daily activities?: Yes Is the patient deaf or have difficulty hearing?: No Does the patient have difficulty seeing, even when wearing glasses/contacts?: Yes Does the patient have difficulty concentrating, remembering, or making decisions?: No Patient able to express need for assistance with ADLs?: Yes Does the patient have difficulty dressing or bathing?: No Independently performs ADLs?: Yes (appropriate for developmental age) Does the patient have difficulty walking or climbing stairs?: No Weakness of Legs: None Weakness of Arms/Hands: Right  Permission Sought/Granted                   Emotional Assessment Appearance:: Appears stated age Attitude/Demeanor/Rapport: Engaged Affect (typically observed): Accepting Orientation: : Oriented to Self, Oriented to Place, Oriented to  Time, Oriented to Situation   Psych Involvement: No (comment)  Admission diagnosis:  Hypercholesterolemia [E78.00] Stroke 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 [place] [address] Phone: [phone] Fax: [phone]  [place] [facility] [address] [place] [address] Phone: [phone] Fax: [phone]  [facility] [phone] - [address], [place] - [address] [place] [address]  Phone: [phone] Fax: [phone]     Social Determinants of Health (SDOH) Social History: SDOH Screenings   Food Insecurity: No Food Insecurity (06/30/2023)  Housing: Low Risk  (06/30/2023)  Transportation Needs: No Transportation Needs (06/30/2023)  Utilities: Not At Risk (06/30/2023)  Alcohol Screen: Low Risk  (03/12/2023)  Depression  (PHQ2-9): Low Risk  (03/12/2023)  Financial Resource Strain: Low Risk  (03/12/2023)  Physical Activity: Inactive (03/12/2023)  Social Connections: Moderately Integrated (03/12/2023)  Stress: No Stress Concern Present (03/12/2023)  Tobacco Use: Low Risk  (06/30/2023)   SDOH Interventions:     Readmission Risk Interventions     No data to display

## 2023-06-30 NOTE — Care Management Obs Status (Signed)
MEDICARE OBSERVATION STATUS NOTIFICATION   Patient Details  Name: Amber Allen MRN: [id number] Date of Birth: [birth year]   Medicare Observation Status Notification Given:  Yes    Kermit Balo, RN 06/30/2023, 3:59 PM

## 2023-06-30 NOTE — Hospital Course (Addendum)
Amber Allen is a 82 y.o.female with a history of hypothyroidism, HLD, HTN (no meds) who was admitted to the [facility] Medicine Teaching Service at [facility] for speech difficulty. 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 MRI showed multiple foci of acute infarction along the left central sulcus, notably in the right hand motor cortex area, no acute hemorrhage or significant mass effect.  CTA was notable for moderate to severe focal narrowing in an M3 segment of the left MCA and chronically occluded distal right PICA. Pt was admitted to our service and started on aspirin 325mg , plavix 75mg , atorvastatin 80mg  daily. Cardioembolic source suspected, echo and DVT studies were normal.  Loop recorder was placed prior to discharge. Word finding difficulty resolved by second day of admission and R arm neglect/weakness had resolved by the date of discharge.  Per neuro, will continue Plavix 75mg  for 3 weeks and take aspirin 81 mg daily.   Hyperlipidemia Pt reported taking 40mg  atorvastatin at home. Dose was increased to 80mg  during admission. Decision was made to return to home dose of 40mg  on discharge based on lipid panel with LDL 65.  Other chronic conditions were medically managed with home medications and formulary alternatives as necessary (hypothyroidism)  PCP Follow-up Recommendations: Plavix ending around September 12 Continue aspirin 81 mg daily and atorvastatin 40 mg daily Cardiology is to follow up on loop recorder

## 2023-06-30 NOTE — Assessment & Plan Note (Signed)
-   continue synthroid - TSH (most recent 1.77)

## 2023-06-30 NOTE — ED Notes (Signed)
ED TO INPATIENT HANDOFF REPORT  ED Nurse Name and Phone #: Marcelino Duster [phone]  S Name/Age/Gender Amber Allen 82 y.o. female Room/Bed: 051C/051C  Code Status   Code Status: DNR  Home/SNF/Other Home Patient oriented to: self, place, time, and situation Is this baseline? Yes   Triage Complete: Triage complete  Chief Complaint Stroke [facility]) [I63.9]  Triage Note No notes on file   Allergies Allergies  Allergen Reactions   Brimonidine Tartrate Other (See Comments)    Level of Care/Admitting Diagnosis ED Disposition     ED Disposition  Admit   Condition  --   Comment  Hospital Area: [facility] [[id number]]  Level of Care: Med-Surg [16]  May place patient in observation at [facility] or [facility] if equivalent level of care is available:: No  Covid Evaluation: Confirmed COVID Negative  Diagnosis: Stroke [facility]) [[id number]]  Admitting Physician: Para March [[id number]]  Attending Physician: Doreene Eland [[id number]]          B Medical/Surgery History 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   Cholecystolithiasis    De Quervain's tenosynovitis    De Quervain's tenosynovitis, left 09/09/2012   Depression    DIVERTICULITIS, HX OF 06/27/2008   Diverticulosis    Hyperlipidemia    Hypertension    Substance abuse (HCC)    Alcohol    Thyroid disease    Unspecified vitamin D deficiency 12/12/2008   Past Surgical History:  Procedure Laterality Date   APPENDECTOMY  1952   BREAST BIOPSY  1994   no malignancy   BREAST EXCISIONAL BIOPSY Left 1994   TONSILLECTOMY     VAGINAL HYSTERECTOMY  1974   including cervix     A IV Location/Drains/Wounds Patient Lines/Drains/Airways Status     Active Line/Drains/Airways     Name Placement date Placement time Site Days   Peripheral IV 06/29/23 20 G Left Antecubital 06/29/23  1440  Antecubital  1            Intake/Output Last 24 hours No intake or output data in the 24  hours ending 06/30/23 0827  Labs/Imaging Results for orders placed or performed during the hospital encounter of 06/29/23 (from the past 48 hour(s))  CBC     Status: None   Collection Time: 06/29/23  4:20 PM  Result Value Ref Range   WBC 8.9 4.0 - 10.5 K/uL   RBC 4.21 3.87 - 5.11 MIL/uL   Hemoglobin 13.6 12.0 - 15.0 g/dL   HCT 13.2 44.0 - 10.2 %   MCV 98.3 80.0 - 100.0 fL   MCH 32.3 26.0 - 34.0 pg   MCHC 32.9 30.0 - 36.0 g/dL   RDW 72.5 36.6 - 44.0 %   Platelets 292 150 - 400 K/uL   nRBC 0.0 0.0 - 0.2 %    Comment: Performed at [facility], [address] [place], [place] [address]  Differential     Status: None   Collection Time: 06/29/23  4:20 PM  Result Value Ref Range   Neutrophils Relative % 65 %   Neutro Abs 5.8 1.7 - 7.7 K/uL   Lymphocytes Relative 22 %   Lymphs Abs 2.0 0.7 - 4.0 K/uL   Monocytes Relative 10 %   Monocytes Absolute 0.9 0.1 - 1.0 K/uL   Eosinophils Relative 2 %   Eosinophils Absolute 0.2 0.0 - 0.5 K/uL   Basophils Relative 1 %   Basophils Absolute 0.1 0.0 -  0.1 K/uL   Immature Granulocytes 0 %   Abs Immature Granulocytes 0.02 0.00 - 0.07 K/uL    Comment: Performed at [facility], [address] [place], [place] [address]  Comprehensive metabolic panel     Status: None   Collection Time: 06/29/23  4:20 PM  Result Value Ref Range   Sodium 138 135 - 145 mmol/L   Potassium 3.9 3.5 - 5.1 mmol/L   Chloride 104 98 - 111 mmol/L   CO2 24 22 - 32 mmol/L   Glucose, Bld 78 70 - 99 mg/dL    Comment: Glucose reference range applies only to samples taken after fasting for at least 8 hours.   BUN 12 8 - 23 mg/dL   Creatinine, Ser 6.04 0.44 - 1.00 mg/dL   Calcium 9.3 8.9 - 54.0 mg/dL   Total Protein 6.7 6.5 - 8.1 g/dL   Albumin 3.7 3.5 - 5.0 g/dL   AST 23 15 - 41 U/L   ALT 18 0 - 44 U/L   Alkaline Phosphatase 79 38 - 126 U/L   Total Bilirubin 0.9 0.3 - 1.2 mg/dL   GFR, Estimated >98 >11 mL/min    Comment: (NOTE) Calculated using the CKD-EPI  Creatinine Equation (2021)    Anion gap 10 5 - 15    Comment: Performed at [facility], [address] [place], [place] [address]  Ethanol     Status: None   Collection Time: 06/29/23  4:20 PM  Result Value Ref Range   Alcohol, Ethyl (B) <10 <10 mg/dL    Comment: (NOTE) Lowest detectable limit for serum alcohol is 10 mg/dL.  For medical purposes only. Performed at [facility], [address], [place], [place] [address]   Hemoglobin A1c     Status: None   Collection Time: 06/29/23  7:51 PM  Result Value Ref Range   Hgb A1c MFr Bld 5.6 4.8 - 5.6 %    Comment: (NOTE) Pre diabetes:          5.7%-6.4%  Diabetes:              >6.4%  Glycemic control for   <7.0% adults with diabetes    Mean Plasma Glucose 114.02 mg/dL    Comment: Performed at [facility], [address], [place], [place] [address]  TSH     Status: None   Collection Time: 06/29/23  8:13 PM  Result Value Ref Range   TSH 0.355 0.350 - 4.500 uIU/mL    Comment: Performed by a 3rd Generation assay with a functional sensitivity of <=0.01 uIU/mL. Performed at [facility], [address] [place], [place] [address]   Urine rapid drug screen (hosp performed)     Status: None   Collection Time: 06/29/23  9:22 PM  Result Value Ref Range   Opiates NONE DETECTED NONE DETECTED   Cocaine NONE DETECTED NONE DETECTED   Benzodiazepines NONE DETECTED NONE DETECTED   Amphetamines NONE DETECTED NONE DETECTED   Tetrahydrocannabinol NONE DETECTED NONE DETECTED   Barbiturates NONE DETECTED NONE DETECTED    Comment: (NOTE) DRUG SCREEN FOR MEDICAL PURPOSES ONLY.  IF CONFIRMATION IS NEEDED FOR ANY PURPOSE, NOTIFY LAB WITHIN 5 DAYS.  LOWEST DETECTABLE LIMITS FOR URINE DRUG SCREEN Drug Class                     Cutoff (ng/mL) Amphetamine and metabolites    1000 Barbiturate and metabolites  200 Benzodiazepine                 200 Opiates and metabolites        300 Cocaine and metabolites         300 THC                            50 Performed at [facility], [address] [place], [place] [address]    MR BRAIN WO CONTRAST  Result Date: 06/29/2023 CLINICAL DATA:  Neuro deficit, acute, stroke suspected. EXAM: MRI HEAD WITHOUT CONTRAST TECHNIQUE: Multiplanar, multiecho pulse sequences of the brain and surrounding structures were obtained without intravenous contrast. COMPARISON:  Head CT and CTA head/neck 06/29/2023. FINDINGS: Brain: Multiple foci of acute infarction along the left central sulcus, notably in the right hand motor cortex area. No acute hemorrhage or significant mass effect. 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 No hydrocephalus or extra-axial collection. No mass effect or midline shift. Vascular: Normal flow voids. Skull and upper cervical spine: Normal marrow signal. Sinuses/Orbits: No acute findings. Other: None. IMPRESSION: 1. Multiple foci of acute infarction along the left central sulcus, notably in the right hand motor cortex area. No acute hemorrhage or significant mass effect. 2. 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 Electronically Signed   By: Orvan Falconer M.D.   On: 06/29/2023 18:43   CT HEAD WO CONTRAST  Result Date: 06/29/2023 CLINICAL DATA:  Neuro deficit, acute, stroke suspected EXAM: CT HEAD WITHOUT CONTRAST CT ANGIOGRAPHY HEAD AND NECK TECHNIQUE: Contiguous axial images were obtained from the base of the skull through the vertex without intravenous contrast. Multidetector CT imaging of the head and neck was performed using the standard protocol during bolus administration of intravenous contrast. Multiplanar CT image reconstructions and MIPs were obtained to evaluate the vascular anatomy. Carotid stenosis measurements (when applicable) are obtained utilizing NASCET criteria, using the distal internal carotid diameter as the denominator. RADIATION DOSE REDUCTION: This exam was performed according to the departmental  dose-optimization program which includes automated exposure control, adjustment of the mA and/or kV according to patient size and/or use of iterative reconstruction technique. CONTRAST:  75mL OMNIPAQUE IOHEXOL 350 MG/ML SOLN COMPARISON:  CT Head 10/17/21 FINDINGS: CT HEAD FINDINGS Brain: Chronic appearing right cerebellar infarct. No hemorrhage. No hydrocephalus. No extra-axial fluid collection. No CT evidence of an acute cortical infarct. No mass effect. No mass lesion. Vascular: No hyperdense vessel or unexpected calcification. Skull: Normal. Negative for fracture or focal lesion. Sinuses/Orbits: Trace bilateral mastoid effusions. No middle ear effusion. Paranasal sinuses are clear. Bilateral lens replacement. Orbits are otherwise unremarkable. Other: None. Review of the MIP images confirms the above findings CTA NECK FINDINGS Aortic arch: Standard branching. Imaged portion shows no evidence of aneurysm or dissection. No significant stenosis of the major arch vessel origins. Right carotid system: No evidence of dissection, stenosis (50% or greater), or occlusion. Left carotid system: No evidence of dissection, stenosis (50% or greater), or occlusion. Vertebral arteries: Codominant. No evidence of dissection, stenosis (50% or greater), or occlusion. Skeleton: Negative. Other neck: Negative. Upper chest: Negative. Review of the MIP images confirms the above findings CTA HEAD FINDINGS Anterior circulation: Moderate to severe focal narrowing in an M3 segment of the left MCA (series 9, image 142). Otherwise no significant stenosis, proximal occlusion, aneurysm, or vascular malformation. Posterior circulation: The right PICA is chronically occluded distally. Otherwise no significant stenosis, proximal occlusion, aneurysm, or vascular malformation  Venous sinuses: As permitted by contrast timing, patent. Anatomic variants: Fetal PCA on the left. Review of the MIP images confirms the above findings IMPRESSION: 1. No  hemorrhage or CT evidence of an acute cortical infarct. Chronic appearing right cerebellar infarct. 2. Moderate to severe focal narrowing in an M3 segment of the left MCA. 3. Chronically occluded distal right PICA. 4. No intracranial large vessel occlusion. 5. No hemodynamically significant stenosis in the neck. Electronically Signed   By: Lorenza Cambridge M.D.   On: 06/29/2023 18:38   CT ANGIO HEAD NECK W WO CM  Result Date: 06/29/2023 CLINICAL DATA:  Neuro deficit, acute, stroke suspected EXAM: CT HEAD WITHOUT CONTRAST CT ANGIOGRAPHY HEAD AND NECK TECHNIQUE: Contiguous axial images were obtained from the base of the skull through the vertex without intravenous contrast. Multidetector CT imaging of the head and neck was performed using the standard protocol during bolus administration of intravenous contrast. Multiplanar CT image reconstructions and MIPs were obtained to evaluate the vascular anatomy. Carotid stenosis measurements (when applicable) are obtained utilizing NASCET criteria, using the distal internal carotid diameter as the denominator. RADIATION DOSE REDUCTION: This exam was performed according to the departmental dose-optimization program which includes automated exposure control, adjustment of the mA and/or kV according to patient size and/or use of iterative reconstruction technique. CONTRAST:  75mL OMNIPAQUE IOHEXOL 350 MG/ML SOLN COMPARISON:  CT Head 10/17/21 FINDINGS: CT HEAD FINDINGS Brain: Chronic appearing right cerebellar infarct. No hemorrhage. No hydrocephalus. No extra-axial fluid collection. No CT evidence of an acute cortical infarct. No mass effect. No mass lesion. Vascular: No hyperdense vessel or unexpected calcification. Skull: Normal. Negative for fracture or focal lesion. Sinuses/Orbits: Trace bilateral mastoid effusions. No middle ear effusion. Paranasal sinuses are clear. Bilateral lens replacement. Orbits are otherwise unremarkable. Other: None. Review of the MIP images  confirms the above findings CTA NECK FINDINGS Aortic arch: Standard branching. Imaged portion shows no evidence of aneurysm or dissection. No significant stenosis of the major arch vessel origins. Right carotid system: No evidence of dissection, stenosis (50% or greater), or occlusion. Left carotid system: No evidence of dissection, stenosis (50% or greater), or occlusion. Vertebral arteries: Codominant. No evidence of dissection, stenosis (50% or greater), or occlusion. Skeleton: Negative. Other neck: Negative. Upper chest: Negative. Review of the MIP images confirms the above findings CTA HEAD FINDINGS Anterior circulation: Moderate to severe focal narrowing in an M3 segment of the left MCA (series 9, image 142). Otherwise no significant stenosis, proximal occlusion, aneurysm, or vascular malformation. Posterior circulation: The right PICA is chronically occluded distally. Otherwise no significant stenosis, proximal occlusion, aneurysm, or vascular malformation Venous sinuses: As permitted by contrast timing, patent. Anatomic variants: Fetal PCA on the left. Review of the MIP images confirms the above findings IMPRESSION: 1. No hemorrhage or CT evidence of an acute cortical infarct. Chronic appearing right cerebellar infarct. 2. Moderate to severe focal narrowing in an M3 segment of the left MCA. 3. Chronically occluded distal right PICA. 4. No intracranial large vessel occlusion. 5. No hemodynamically significant stenosis in the neck. Electronically Signed   By: Lorenza Cambridge M.D.   On: 06/29/2023 18:38    Pending Labs Unresulted Labs (From admission, onward)     Start     Ordered   06/30/23 0500  Lipid panel  (Labs)  Tomorrow morning,   R       Comments: Fasting    06/29/23 1942            Vitals/Pain Today's Vitals  06/30/23 0412 06/30/23 0419 06/30/23 0515 06/30/23 0822  BP: (!) 115/55   116/61  Pulse: 66     Resp: 16   16  Temp: 97.8 F (36.6 C)   98.3 F (36.8 C)  TempSrc: Oral    Oral  SpO2: 98%   100%  Weight:      PainSc:  0-No pain 0-No pain 0-No pain    Isolation Precautions No active isolations  Medications Medications   stroke: early stages of recovery book (has no administration in time range)  aspirin tablet 325 mg (has no administration in time range)  clopidogrel (PLAVIX) tablet 75 mg (has no administration in time range)  atorvastatin (LIPITOR) tablet 80 mg (80 mg Oral Not Given 06/29/23 2155)  enoxaparin (LOVENOX) injection 40 mg (has no administration in time range)  levothyroxine (SYNTHROID) tablet 100 mcg (has no administration in time range)  iohexol (OMNIPAQUE) 350 MG/ML injection 75 mL (75 mLs Intravenous Contrast Given 06/29/23 1756)  aspirin tablet 650 mg (650 mg Oral Given 06/29/23 2157)  clopidogrel (PLAVIX) tablet 300 mg (300 mg Oral Given 06/29/23 2158)    Mobility walks     Focused Assessments Neuro Assessment Handoff:  Swallow screen pass? Yes    NIH Stroke Scale  Dizziness Present: No Headache Present: No Interval: Shift assessment Level of Consciousness (1a.)   : Alert, keenly responsive LOC Questions (1b. )   : Answers both questions correctly LOC Commands (1c. )   : Performs both tasks correctly Best Gaze (2. )  : Normal Visual (3. )  : No visual loss Facial Palsy (4. )    : Normal symmetrical movements Motor Arm, Left (5a. )   : No drift Motor Arm, Right (5b. ) : No drift Motor Leg, Left (6a. )  : No drift Motor Leg, Right (6b. ) : No drift Limb Ataxia (7. ): Absent Sensory (8. )  : Normal, no sensory loss Best Language (9. )  : No aphasia Dysarthria (10. ): Normal Extinction/Inattention (11.)   : Visual/tactile/auditory/spatial/personal inattention (pt reports when she closes her eyes she doesn't know where her arm is.) Complete NIHSS TOTAL: 1     Neuro Assessment: Within Defined Limits Neuro Checks:   Shift assessment (06/29/23 2252)  Has TPA been given? No If patient is a Neuro Trauma and patient is going  to OR before floor call report to 4N Charge nurse: [phone] or [phone]   R Recommendations: See Admitting Provider Note  Report given to:   Additional Notes:

## 2023-06-30 NOTE — Progress Notes (Addendum)
STROKE TEAM PROGRESS NOTE   BRIEF HPI [name] is a 82 y.o. female with history of hypertension, hyperlipidemia, previous alcohol abuse, vitamin D deficiency and hypothyroidism presenting with acute onset speech difficulty and right-sided weakness.  Patient states that most symptoms lasted about 45 minutes and then resolved, with some residual feelings that her right arm is feeling into her and does not feel the way he normally does.  MRI demonstrates multiple small strokes along left central sulcus.  SIGNIFICANT HOSPITAL EVENTS   INTERIM HISTORY/SUBJECTIVE Patient has been hemodynamically stable and states that she feels like her voice has improved.  However, her right arm still feels odd to her.  Encouraged patient to do fine motor activities and use right arm is much as possible.  OBJECTIVE  CBC    Component Value Date/Time   WBC 8.9 06/29/2023 1620   RBC 4.21 06/29/2023 1620   HGB 13.6 06/29/2023 1620   HGB 13.1 02/16/2023 1111   HCT 41.4 06/29/2023 1620   HCT 39.7 02/16/2023 1111   PLT 292 06/29/2023 1620   PLT 305 02/16/2023 1111   MCV 98.3 06/29/2023 1620   MCV 97 02/16/2023 1111   MCH 32.3 06/29/2023 1620   MCHC 32.9 06/29/2023 1620   RDW 12.8 06/29/2023 1620   RDW 10.9 (L) 02/16/2023 1111   LYMPHSABS 2.0 06/29/2023 1620   MONOABS 0.9 06/29/2023 1620   EOSABS 0.2 06/29/2023 1620   BASOSABS 0.1 06/29/2023 1620    BMET    Component Value Date/Time   NA 138 06/29/2023 1620   NA 138 02/16/2023 1111   K 3.9 06/29/2023 1620   CL 104 06/29/2023 1620   CO2 24 06/29/2023 1620   GLUCOSE 78 06/29/2023 1620   BUN 12 06/29/2023 1620   BUN 9 02/16/2023 1111   CREATININE 0.82 06/29/2023 1620   CREATININE 0.75 01/22/2017 1029   CALCIUM 9.3 06/29/2023 1620   EGFR 68 02/16/2023 1111   GFRNONAA >60 06/29/2023 1620   GFRNONAA 78 01/22/2017 1029    IMAGING past 24 hours VAS [place] LOWER EXTREMITY VENOUS (DVT)  Result Date: 06/30/2023  Lower Venous DVT Study Patient  Name:  Amber Allen  Date of Exam:   06/30/2023 Medical Rec #: [id number]       Accession #:    [id number] Date of Birth: 08/21/1941       Patient Gender: F Patient Age:   70 years Exam Location:  [facility] Procedure:      VAS [place] LOWER EXTREMITY VENOUS (DVT) Referring Phys: Scheryl Marten Verbena Boeding --------------------------------------------------------------------------------  Indications: Stroke.  Comparison Study: No previous exams Performing Technologist: Jody Hill RVT, RDMS  Examination Guidelines: A complete evaluation includes B-mode imaging, spectral Doppler, color Doppler, and power Doppler as needed of all accessible portions of each vessel. Bilateral testing is considered an integral part of a complete examination. Limited examinations for reoccurring indications may be performed as noted. The reflux portion of the exam is performed with the patient in reverse Trendelenburg.  +---------+---------------+---------+-----------+----------+--------------+ RIGHT    CompressibilityPhasicitySpontaneityPropertiesThrombus Aging +---------+---------------+---------+-----------+----------+--------------+ CFV      Full           Yes      Yes                                 +---------+---------------+---------+-----------+----------+--------------+ SFJ      Full                                                        +---------+---------------+---------+-----------+----------+--------------+  FV Prox  Full           Yes      Yes                                 +---------+---------------+---------+-----------+----------+--------------+ FV Mid   Full           Yes      Yes                                 +---------+---------------+---------+-----------+----------+--------------+ FV DistalFull           Yes      Yes                                 +---------+---------------+---------+-----------+----------+--------------+ PFV      Full                                                         +---------+---------------+---------+-----------+----------+--------------+ POP      Full           Yes      Yes                                 +---------+---------------+---------+-----------+----------+--------------+ PTV      Full                                                        +---------+---------------+---------+-----------+----------+--------------+ PERO     Full                                                        +---------+---------------+---------+-----------+----------+--------------+   +---------+---------------+---------+-----------+----------+--------------+ LEFT     CompressibilityPhasicitySpontaneityPropertiesThrombus Aging +---------+---------------+---------+-----------+----------+--------------+ CFV      Full           Yes      Yes                                 +---------+---------------+---------+-----------+----------+--------------+ SFJ      Full                                                        +---------+---------------+---------+-----------+----------+--------------+ FV Prox  Full           Yes      Yes                                 +---------+---------------+---------+-----------+----------+--------------+ FV Mid   Full  Yes      Yes                                 +---------+---------------+---------+-----------+----------+--------------+ FV DistalFull           Yes      Yes                                 +---------+---------------+---------+-----------+----------+--------------+ PFV      Full                                                        +---------+---------------+---------+-----------+----------+--------------+ POP      Full           Yes      Yes                                 +---------+---------------+---------+-----------+----------+--------------+ PTV      Full                                                         +---------+---------------+---------+-----------+----------+--------------+ PERO     Full                                                        +---------+---------------+---------+-----------+----------+--------------+     Summary: BILATERAL: - No evidence of deep vein thrombosis seen in the lower extremities, bilaterally. -No evidence of popliteal cyst, bilaterally.   *See table(s) above for measurements and observations.    Preliminary    MR BRAIN WO CONTRAST  Result Date: 06/29/2023 CLINICAL DATA:  Neuro deficit, acute, stroke suspected. EXAM: MRI HEAD WITHOUT CONTRAST TECHNIQUE: Multiplanar, multiecho pulse sequences of the brain and surrounding structures were obtained without intravenous contrast. COMPARISON:  Head CT and CTA head/neck 06/29/2023. FINDINGS: Brain: Multiple foci of acute infarction along the left central sulcus, notably in the right hand motor cortex area. No acute hemorrhage or significant mass effect. 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 No hydrocephalus or extra-axial collection. No mass effect or midline shift. Vascular: Normal flow voids. Skull and upper cervical spine: Normal marrow signal. Sinuses/Orbits: No acute findings. Other: None. IMPRESSION: 1. Multiple foci of acute infarction along the left central sulcus, notably in the right hand motor cortex area. No acute hemorrhage or significant mass effect. 2. 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 Electronically Signed   By: Orvan Falconer M.D.   On: 06/29/2023 18:43   CT HEAD WO CONTRAST  Result Date: 06/29/2023 CLINICAL DATA:  Neuro deficit, acute, stroke suspected EXAM: CT HEAD WITHOUT CONTRAST CT ANGIOGRAPHY HEAD AND NECK TECHNIQUE: Contiguous axial images were obtained from the base of the skull through the vertex without intravenous contrast. Multidetector CT imaging of the head and neck was performed using the standard protocol during bolus administration of  intravenous contrast.  Multiplanar CT image reconstructions and MIPs were obtained to evaluate the vascular anatomy. Carotid stenosis measurements (when applicable) are obtained utilizing NASCET criteria, using the distal internal carotid diameter as the denominator. RADIATION DOSE REDUCTION: This exam was performed according to the departmental dose-optimization program which includes automated exposure control, adjustment of the mA and/or kV according to patient size and/or use of iterative reconstruction technique. CONTRAST:  75mL OMNIPAQUE IOHEXOL 350 MG/ML SOLN COMPARISON:  CT Head 10/17/21 FINDINGS: CT HEAD FINDINGS Brain: Chronic appearing right cerebellar infarct. No hemorrhage. No hydrocephalus. No extra-axial fluid collection. No CT evidence of an acute cortical infarct. No mass effect. No mass lesion. Vascular: No hyperdense vessel or unexpected calcification. Skull: Normal. Negative for fracture or focal lesion. Sinuses/Orbits: Trace bilateral mastoid effusions. No middle ear effusion. Paranasal sinuses are clear. Bilateral lens replacement. Orbits are otherwise unremarkable. Other: None. Review of the MIP images confirms the above findings CTA NECK FINDINGS Aortic arch: Standard branching. Imaged portion shows no evidence of aneurysm or dissection. No significant stenosis of the major arch vessel origins. Right carotid system: No evidence of dissection, stenosis (50% or greater), or occlusion. Left carotid system: No evidence of dissection, stenosis (50% or greater), or occlusion. Vertebral arteries: Codominant. No evidence of dissection, stenosis (50% or greater), or occlusion. Skeleton: Negative. Other neck: Negative. Upper chest: Negative. Review of the MIP images confirms the above findings CTA HEAD FINDINGS Anterior circulation: Moderate to severe focal narrowing in an M3 segment of the left MCA (series 9, image 142). Otherwise no significant stenosis, proximal occlusion, aneurysm, or vascular malformation. Posterior  circulation: The right PICA is chronically occluded distally. Otherwise no significant stenosis, proximal occlusion, aneurysm, or vascular malformation Venous sinuses: As permitted by contrast timing, patent. Anatomic variants: Fetal PCA on the left. Review of the MIP images confirms the above findings IMPRESSION: 1. No hemorrhage or CT evidence of an acute cortical infarct. Chronic appearing right cerebellar infarct. 2. Moderate to severe focal narrowing in an M3 segment of the left MCA. 3. Chronically occluded distal right PICA. 4. No intracranial large vessel occlusion. 5. No hemodynamically significant stenosis in the neck. Electronically Signed   By: Lorenza Cambridge M.D.   On: 06/29/2023 18:38   CT ANGIO HEAD NECK W WO CM  Result Date: 06/29/2023 CLINICAL DATA:  Neuro deficit, acute, stroke suspected EXAM: CT HEAD WITHOUT CONTRAST CT ANGIOGRAPHY HEAD AND NECK TECHNIQUE: Contiguous axial images were obtained from the base of the skull through the vertex without intravenous contrast. Multidetector CT imaging of the head and neck was performed using the standard protocol during bolus administration of intravenous contrast. Multiplanar CT image reconstructions and MIPs were obtained to evaluate the vascular anatomy. Carotid stenosis measurements (when applicable) are obtained utilizing NASCET criteria, using the distal internal carotid diameter as the denominator. RADIATION DOSE REDUCTION: This exam was performed according to the departmental dose-optimization program which includes automated exposure control, adjustment of the mA and/or kV according to patient size and/or use of iterative reconstruction technique. CONTRAST:  75mL OMNIPAQUE IOHEXOL 350 MG/ML SOLN COMPARISON:  CT Head 10/17/21 FINDINGS: CT HEAD FINDINGS Brain: Chronic appearing right cerebellar infarct. No hemorrhage. No hydrocephalus. No extra-axial fluid collection. No CT evidence of an acute cortical infarct. No mass effect. No mass lesion.  Vascular: No hyperdense vessel or unexpected calcification. Skull: Normal. Negative for fracture or focal lesion. Sinuses/Orbits: Trace bilateral mastoid effusions. No middle ear effusion. Paranasal sinuses are clear. Bilateral lens replacement. Orbits are otherwise unremarkable. Other: None.  Review of the MIP images confirms the above findings CTA NECK FINDINGS Aortic arch: Standard branching. Imaged portion shows no evidence of aneurysm or dissection. No significant stenosis of the major arch vessel origins. Right carotid system: No evidence of dissection, stenosis (50% or greater), or occlusion. Left carotid system: No evidence of dissection, stenosis (50% or greater), or occlusion. Vertebral arteries: Codominant. No evidence of dissection, stenosis (50% or greater), or occlusion. Skeleton: Negative. Other neck: Negative. Upper chest: Negative. Review of the MIP images confirms the above findings CTA HEAD FINDINGS Anterior circulation: Moderate to severe focal narrowing in an M3 segment of the left MCA (series 9, image 142). Otherwise no significant stenosis, proximal occlusion, aneurysm, or vascular malformation. Posterior circulation: The right PICA is chronically occluded distally. Otherwise no significant stenosis, proximal occlusion, aneurysm, or vascular malformation Venous sinuses: As permitted by contrast timing, patent. Anatomic variants: Fetal PCA on the left. Review of the MIP images confirms the above findings IMPRESSION: 1. No hemorrhage or CT evidence of an acute cortical infarct. Chronic appearing right cerebellar infarct. 2. Moderate to severe focal narrowing in an M3 segment of the left MCA. 3. Chronically occluded distal right PICA. 4. No intracranial large vessel occlusion. 5. No hemodynamically significant stenosis in the neck. Electronically Signed   By: Lorenza Cambridge M.D.   On: 06/29/2023 18:38    Vitals:   06/29/23 2230 06/30/23 0412 06/30/23 0822 06/30/23 1248  BP: (!) 124/55 (!)  115/55 116/61 (!) 125/99  Pulse: 80 66  74  Resp:  16 16 16   Temp:  97.8 F (36.6 C) 98.3 F (36.8 C) 98.6 F (37 C)  TempSrc:  Oral Oral Oral  SpO2: 95% 98% 100% 99%  Weight:         PHYSICAL EXAM General:  Alert, well-nourished, well-developed patient in no acute distress Psych:  Mood and affect appropriate for situation CV: Regular rate and rhythm on monitor Respiratory:  Regular, unlabored respirations on room air   NEURO:  Mental Status: AA&Ox3, patient is able to give clear and coherent history Speech/Language: speech is without dysarthria or aphasia.    Cranial Nerves:  II: PERRL. Visual fields full.  III, IV, VI: EOMI. Eyelids elevate symmetrically.  V: Sensation is intact to light touch and symmetrical to face.  VII: Face is symmetrical resting and smiling VIII: hearing intact to voice. IX, X: Phonation is normal.  WU:[id number] shrug 5/5. XII: tongue is midline without fasciculations. Motor: 5/5 strength to all muscle groups tested.  Tone: is normal and bulk is normal Sensation- Intact to light touch bilaterally.  Coordination: FTN intact bilaterally.No drift.  Gait- deferred   ASSESSMENT/PLAN  Acute Ischemic Infarct:  left scattered infarcts along left central sulcus Etiology: Uncertain at this time, possibly cardioembolic CT head No acute abnormality.  Chronic right cerebellar infarct  CTA head & neck Moderate to severe focal narrowing in an M3 segment of left MCA, chronically occluded distal right PICA  MRI multiple foci of infarction along the left central sulcus 2D Echo EF 65-70% Bilateral LE venous doppler negative for evidence of DVT Recommend loop recorder placement prior to discharge LDL 65 HgbA1c 5.6 VTE prophylaxis - Lovenox No antithrombotic prior to admission, now on aspirin 81 mg daily and clopidogrel 75 mg daily for 3 weeks and then aspirin alone.  Discussed with pt and her granddaughter about OCEANIC trial, she is considering Therapy  recommendations: Pending Disposition: Home  Hx of Stroke/TIA Patient has chronic right cerebellar infarct seen on head CT,  but has no history of this being diagnosed, asymptomatic  Hypertension Home meds: None Stable Long term BP goal normotensive  Hyperlipidemia Home meds: lipitor 40 LDL 65, goal < 70 On atorvastatin 80 mg daily Continue statin at discharge  Other Stroke Risk Factors Hx of alcohol use  Other Active Problems Hypothyroidism-continue home Synthroid  Hospital day # 0 Patient seen by NP and then by MD, MD to edit note as needed. Cortney E Ernestina Columbia , MSN, AGACNP-BC [facility] See Amion for schedule and pager information 06/30/2023 2:43 PM   ATTENDING NOTE: I reviewed above note and agree with the assessment and plan. Pt was seen and examined.   Granddaughter at bedside.  Patient sitting in bed, neuro intact on exam, however, still feel slight right hand clumsy subjectively.  MRI showed left frontal cluster of small infarcts, concerning for cardioembolic source.  Recommend loop recorder prior to discharge.  On DAPT for 3 weeks and then aspirin alone.  Patient was given information for oceanic trial, she is considering.  On Lipitor 80.  Stroke risk factor modification.  Will follow  For detailed assessment and plan, please refer to above/below as I have made changes wherever appropriate.   Marvel Plan, MD PhD Stroke Neurology 06/30/2023 6:43 PM    To contact Stroke Continuity provider, please refer to [email or web address]. After hours, contact General Neurology

## 2023-06-30 NOTE — Evaluation (Signed)
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   Hemorrhoid 02/16/2023   Medication nonadherence due to psychosocial problem 02/12/2022   Urge incontinence 10/21/2021   Systolic murmur 01/19/2020   Osteopenia 03/25/2018   Hyperplastic colon polyp 01/29/2017   Diverticulosis of colon 01/22/2017   Hypertension 02/18/2013   History of alcohol use disorder 02/18/2013   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   Chronic rhinitis 03/09/2007   Hypothyroidism 01/06/2007   Hypercholesterolemia 01/06/2007   Depression, recurrent (HCC) 01/06/2007   Past Medical History:  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09/09/2012   Depression    DIVERTICULITIS, HX OF 06/27/2008   Diverticulosis    Hyperlipidemia    Hypertension    Substance abuse (HCC)    Alcohol    Thyroid disease    Unspecified vitamin D deficiency 12/12/2008   Past Surgical History:  Past Surgical History:  Procedure Laterality Date   APPENDECTOMY  1952   BREAST BIOPSY  1994   no malignancy   BREAST EXCISIONAL BIOPSY Left 1994   TONSILLECTOMY     VAGINAL HYSTERECTOMY  1974   including cervix   HPI:  Amber Allen is an 82 yo female presenting to ED 8/20 with word finding difficulties and transient facial droop. MRI Brain with multiple foci of acute infarction along L central sulcus, notably in the R hand motor cortex area without any acute hemorrhage or significant mass effect. 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 She states she has no acute concerns with cognition or speech. Pt scored a 25/30 on the SLUMS (a score of 27 or above is considered WFL) characterized by difficulty with the delayed recall portion. She reports experiencing some memory difficulty PTA and does not consider this different from baseline. Provided education regarding increased assistance at d/c with medication and finances PRN. No further SLP f/u is necessary at this time. Will s/o.    SLP Assessment  SLP Recommendation/Assessment: Patient does not need any further Speech Lanaguage Pathology Services SLP Visit Diagnosis: Cognitive communication deficit (R41.841)    Recommendations for follow up therapy are one component of a multi-disciplinary discharge planning process, led by the attending physician.  Recommendations may be updated based on patient status, additional functional criteria and insurance authorization.    Follow Up Recommendations  No SLP follow up    Assistance Recommended at Discharge  PRN  Functional Status Assessment Patient has not had a recent decline in their functional status  Frequency and Duration           SLP Evaluation Cognition  Overall Cognitive Status: Impaired/Different from baseline Arousal/Alertness: Awake/alert Orientation Level: Oriented X4 Attention: Selective Selective Attention: Appears intact Memory: Impaired Memory Impairment: Retrieval deficit Awareness: Appears intact Problem Solving: Appears intact       Comprehension  Auditory Comprehension Overall Auditory Comprehension: Appears within functional  limits for tasks assessed    Expression Expression Primary Mode of Expression: Verbal Verbal Expression Overall Verbal Expression: Appears within functional limits  for tasks assessed Written Expression Dominant Hand: Right   Oral / Motor  Oral Motor/Sensory Function Overall Oral Motor/Sensory Function: Within functional limits Motor Speech Overall Motor Speech: Appears within functional limits for tasks assessed            Gwynneth Aliment, M.A., CF-SLP Speech Language Pathology, Acute Rehabilitation Services  Secure Chat preferred [phone]  06/30/2023, 5:19 PM

## 2023-06-30 NOTE — Progress Notes (Signed)
BLE venous duplex has been completed.    Results can be found under chart review under CV PROC. 06/30/2023 12:03 PM Malala Trenkamp RVT, RDMS

## 2023-06-30 NOTE — ED Notes (Signed)
Admitting at bedside 

## 2023-06-30 NOTE — Progress Notes (Signed)
     Daily Progress Note Intern Pager: [phone]  Patient name: Amber Allen Medical record number: [id number] Date of birth: [birth year] Age: 82 y.o. Gender: female  Primary Care Provider: Levin Erp, MD Consultants: neurology Code Status: DNR  Pt Overview and Major Events to Date:  8/20: admitted, acute CVA found on MRI  Assessment and Plan: Amber Allen is a 82 y.o. female presenting with word finding difficulty and transient facial droop, now resolved. Found to have acute CVA on MRI. Still having some R arm neglect and mild weakness. Pending cardiac workup to investigate for embolic source of stroke.  Assessment & Plan Cerebrovascular accident (CVA), unspecified mechanism (HCC) Presented with word finding difficulty that began 8/20 at 12 PM. MRI with multiple foci of acute infarction along the left central sulcus, notably in the right hand motor cortex area, no acute hemorrhage or significant mass effect. Subjective right hand neglect and mild weakness on exam. S/p ASA 650, plavix 300 load. A1c 5.6. -MedSurg, observation status -Appreciate ongoing neurology recommendations -Statin as below -Plavix 75 mg and aspirin 325mg  daily -Echocardiogram -LE DVT ultrasound -Lipid panel -Cardiac telemetry -Neurochecks every 4 hours -PT/OT eval -SLP eval Hypercholesterolemia Pt was taking atorvastatin 40mg  daily at home. - increase atorvastatin to 80mg  per neuro - lipid panel pending Hypothyroidism, unspecified type Has been compliant with medications. TSH WNL. - continue synthroid    FEN/GI: heart healthy PPx: lovenox Dispo: pending clinical stability  Subjective:  Spoke with patient this morning in her room in the ED.  Patient feels she is doing much better, denies word finding difficulty.  States she is still experiencing some neglect on the right side, states it is hard for her to use her right arm.  Reports she has been taking her levothyroxine and statin  recently, filled these prescriptions in June.  Objective: Temp:  [97.8 F (36.6 C)-98.5 F (36.9 C)] 97.8 F (36.6 C) (08/21 0412) Pulse Rate:  [66-94] 66 (08/21 0412) Resp:  [13-29] 16 (08/21 0412) BP: (103-145)/(55-88) 115/55 (08/21 0412) SpO2:  [95 %-99 %] 98 % (08/21 0412) Weight:  [72.6 kg] 72.6 kg (08/20 1438) Physical Exam: General: Elderly female resting comfortably in bed, in no acute distress Cardiovascular: Regular rate and rhythm, normal S1/S2 no murmurs rubs or gallops Respiratory: Clear to auscultation bilaterally Abdomen: Soft, nontender, nondistended, bowel sounds present Extremities: No edema to bilateral lower extremities Neurologic: AAOx4, CN II through XII intact, 4+ strength in RUE, 5 in other extremities, mild right arm drift noted with extension  Laboratory: Most recent CBC Lab Results  Component Value Date   WBC 8.9 06/29/2023   HGB 13.6 06/29/2023   HCT 41.4 06/29/2023   MCV 98.3 06/29/2023   PLT 292 06/29/2023   Most recent BMP    Latest Ref Rng & Units 06/29/2023    4:20 PM  BMP  Glucose 70 - 99 mg/dL 78   BUN 8 - 23 mg/dL 12   Creatinine 5.36 - 1.00 mg/dL 6.44   Sodium 034 - 742 mmol/L 138   Potassium 3.5 - 5.1 mmol/L 3.9   Chloride 98 - 111 mmol/L 104   CO2 22 - 32 mmol/L 24   Calcium 8.9 - 10.3 mg/dL 9.3     Other pertinent labs  TSH- 0.355  UDS negative Imaging/Diagnostic Tests: none Lorayne Bender, MD 06/30/2023, 7:30 AM  PGY-1, [facility] FPTS Intern pager: [phone], text pages welcome Secure chat group [facility] Teaching Service

## 2023-06-30 NOTE — Progress Notes (Addendum)
PT Cancellation Note  Patient Details Name: ACQUANETTA PRASKA MRN: [id number] DOB: Mar 26, 1941   Cancelled Treatment:    Reason Eval/Treat Not Completed: PT screened, no needs identified, will sign off   Kate Sable 06/30/2023, 11:45 AM 06/30/2023  Jacinto Halim., PT Acute Rehabilitation Services [phone]  (office)

## 2023-06-30 NOTE — ED Notes (Signed)
Patient transported to Vascular 

## 2023-06-30 NOTE — Evaluation (Signed)
Occupational Therapy Evaluation Patient Details Name: Amber Allen MRN: [id number] DOB: 10/23/1941 Today's Date: 06/30/2023   History of Present Illness Amber Allen is a 82 y.o. female presenting with acute onset speech difficulty and right-sided weakness.   MRI positive for left central sulcus infarct.  Noted older history of right cerebellar infarct.  PMH: hypertension, hyperlipidemia, previous alcohol abuse, vitamin D deficiency and hypothyroidism.   Clinical Impression   Pt with slight decreased RUE FM and gross motor coordination compared to baseline but not affecting functional use for selfcare tasks, self feeding, ect.  Overall independent for transfers, mobility, and selfcare tasks.  Provided education on FM coordination exercises to complete daily and encouraged continued normal functional use with all activities.  No further OT needs at this time acute or post acute.            Functional Status Assessment  Patient has not had a recent decline in their functional status  Equipment Recommendations  None recommended by OT       Precautions / Restrictions Precautions Precautions: None Restrictions Weight Bearing Restrictions: No      Mobility Bed Mobility Overal bed mobility: Independent                  Transfers Overall transfer level: Independent                        Balance Overall balance assessment: No apparent balance deficits (not formally assessed)                                         ADL either performed or assessed with clinical judgement   ADL Overall ADL's : Modified independent                                       General ADL Comments: Pt currently close to baseline for functional completion of ADLs.  Balance WFLs for selfcare tasks, mobility, and transfers.  Slight decreased gross and FM coordination noted from baseline but feel it does not warrant further OT services at this time.   Provided handout to pt's grandaughter with review of FM coordination tasks that can be performed daily at home as pt was leaving to go to vascular study.  Pt's granddaughter demonstrates understanding and can review with the patient.  No further OT needs at this time.     Vision Baseline Vision/History: 1 Wears glasses (reading only) Patient Visual Report: No change from baseline Vision Assessment?: Yes;No apparent visual deficits Eye Alignment: Within Functional Limits Ocular Range of Motion: Within Functional Limits Alignment/Gaze Preference: Within Defined Limits Tracking/Visual Pursuits: Able to track stimulus in all quads without difficulty Saccades: Within functional limits Convergence: Within functional limits Visual Fields: No apparent deficits     Perception Perception: Within Functional Limits       Praxis Praxis: WFL       Pertinent Vitals/Pain Pain Assessment Pain Assessment: No/denies pain     Extremity/Trunk Assessment Upper Extremity Assessment Upper Extremity Assessment: RUE deficits/detail RUE Deficits / Details: AROM and strength WFLs, slight decreased speed noted with finger to nose compared to the LUE.  Slight decreased gross and FM coordination but did not affect functional use for donning socks and tying gown. RUE Sensation: decreased proprioception (in  digits only, intact at shoulder, elbow, and wrist) RUE Coordination: decreased fine motor;decreased gross motor   Lower Extremity Assessment Lower Extremity Assessment: Overall WFL for tasks assessed   Cervical / Trunk Assessment Cervical / Trunk Assessment: Normal   Communication Communication Communication: No apparent difficulties   Cognition Arousal: Alert Behavior During Therapy: WFL for tasks assessed/performed Overall Cognitive Status: Within Functional Limits for tasks assessed                                                  Home Living Family/patient expects to be  discharged to:: Private residence Living Arrangements: Alone   Type of Home: Apartment Home Access: Stairs to enter [employer] of Steps: 4 Entrance Stairs-Rails: Left Home Layout: One level     Bathroom Shower/Tub: Tub/shower unit (pt likes to take tub baths)   Bathroom Toilet: Standard Bathroom Accessibility: Yes   Home Equipment: Grab bars - tub/shower;None          Prior Functioning/Environment Prior Level of Function : Independent/Modified Independent                         AM-PAC OT "6 Clicks" Daily Activity     Outcome Measure Help from another person eating meals?: None Help from another person taking care of personal grooming?: None Help from another person toileting, which includes using toliet, bedpan, or urinal?: None Help from another person bathing (including washing, rinsing, drying)?: None Help from another person to put on and taking off regular upper body clothing?: None Help from another person to put on and taking off regular lower body clothing?: None 6 Click Score: 24   End of Session Equipment Utilized During Treatment: Gait belt Nurse Communication: Mobility status  Activity Tolerance: Patient tolerated treatment well Patient left: in bed;with call bell/phone within reach;with family/visitor present                   Time: 4098-1191 OT Time Calculation (min): 27 min Charges:  OT General Charges $OT Visit: 1 Visit OT Evaluation $OT Eval Low Complexity: 1 Low OT Treatments $Self Care/Home Management : 8-22 mins Amber Allen, OTR/L Acute Rehabilitation Services  Office [phone] 06/30/2023

## 2023-06-30 NOTE — Assessment & Plan Note (Signed)
Has been compliant with medications. TSH WNL. - continue synthroid 

## 2023-07-01 ENCOUNTER — Encounter (HOSPITAL_COMMUNITY)
Admission: EM | Disposition: A | Discharge status: Home / Self Care | Payer: Self-pay | Source: Home / Self Care | Attending: Family Medicine

## 2023-07-01 ENCOUNTER — Other Ambulatory Visit (HOSPITAL_COMMUNITY): Payer: Self-pay

## 2023-07-01 DIAGNOSIS — E039 Hypothyroidism, unspecified: Secondary | ICD-10-CM | POA: Diagnosis not present

## 2023-07-01 DIAGNOSIS — E7800 Pure hypercholesterolemia, unspecified: Secondary | ICD-10-CM | POA: Diagnosis not present

## 2023-07-01 DIAGNOSIS — I63412 Cerebral infarction due to embolism of left middle cerebral artery: Secondary | ICD-10-CM

## 2023-07-01 DIAGNOSIS — I639 Cerebral infarction, unspecified: Secondary | ICD-10-CM | POA: Diagnosis not present

## 2023-07-01 HISTORY — PX: LOOP RECORDER INSERTION: EP1214

## 2023-07-01 SURGERY — LOOP RECORDER INSERTION

## 2023-07-01 MED ORDER — LIDOCAINE-EPINEPHRINE 1 %-1:100000 IJ SOLN
INTRAMUSCULAR | Status: AC
  Filled 2023-07-01: qty 1

## 2023-07-01 MED ORDER — STUDY - OCEANIC-STROKE - ASUNDEXIAN 50 MG OR PLACEBO TABLET (PI-SETHI)
1.0000 | ORAL_TABLET | Freq: Every day | ORAL | Status: DC
  Administered 2023-07-01: 50 mg via ORAL
  Filled 2023-07-01: qty 1

## 2023-07-01 MED ORDER — STUDY - OCEANIC-STROKE - ASUNDEXIAN 50 MG OR PLACEBO TABLET (PI-SETHI): 1.0000 | ORAL_TABLET | Freq: Every day | ORAL | 0 refills | Status: DC

## 2023-07-01 MED ORDER — CLOPIDOGREL BISULFATE 75 MG PO TABS
75.0000 mg | ORAL_TABLET | Freq: Every day | ORAL | 0 refills | Status: AC
  Filled 2023-07-01: qty 18, 18d supply, fill #0

## 2023-07-01 MED ORDER — LIDOCAINE-EPINEPHRINE 1 %-1:100000 IJ SOLN
INTRAMUSCULAR | Status: DC | PRN
  Administered 2023-07-01: 10 mL via INTRADERMAL

## 2023-07-01 SURGICAL SUPPLY — 2 items
MONITOR CARDIAC ASSERT IQ EL (Prosthesis & Implant Heart) IMPLANT
PACK LOOP INSERTION (CUSTOM PROCEDURE TRAY) ×1 IMPLANT

## 2023-07-01 NOTE — Plan of Care (Signed)
Patient discharged home with self care. Loop recorder completed and site clean, dry and intact. AVS printed and reviewed. Patient transported to main entrance via wheelchair with tec. New medications with patient.     Problem: Education: Goal: Knowledge of disease or condition will improve Outcome: Adequate for Discharge Goal: Knowledge of secondary prevention will improve (MUST DOCUMENT ALL) Outcome: Adequate for Discharge Goal: Knowledge of patient specific risk factors will improve Loraine Leriche N/A or DELETE if not current risk factor) Outcome: Adequate for Discharge   Problem: Ischemic Stroke/TIA Tissue Perfusion: Goal: Complications of ischemic stroke/TIA will be minimized Outcome: Adequate for Discharge   Problem: Coping: Goal: Will verbalize positive feelings about self Outcome: Adequate for Discharge Goal: Will identify appropriate support needs Outcome: Adequate for Discharge   Problem: Health Behavior/Discharge Planning: Goal: Ability to manage health-related needs will improve Outcome: Adequate for Discharge Goal: Goals will be collaboratively established with patient/family Outcome: Adequate for Discharge   Problem: Self-Care: Goal: Ability to participate in self-care as condition permits will improve Outcome: Adequate for Discharge Goal: Verbalization of feelings and concerns over difficulty with self-care will improve Outcome: Adequate for Discharge Goal: Ability to communicate needs accurately will improve Outcome: Adequate for Discharge   Problem: Nutrition: Goal: Risk of aspiration will decrease Outcome: Adequate for Discharge Goal: Dietary intake will improve Outcome: Adequate for Discharge   Problem: Education: Goal: Knowledge of General Education information will improve Description: Including pain rating scale, medication(s)/side effects and non-pharmacologic comfort measures Outcome: Adequate for Discharge   Problem: Health Behavior/Discharge  Planning: Goal: Ability to manage health-related needs will improve Outcome: Adequate for Discharge   Problem: Clinical Measurements: Goal: Ability to maintain clinical measurements within normal limits will improve Outcome: Adequate for Discharge Goal: Will remain free from infection Outcome: Adequate for Discharge Goal: Diagnostic test results will improve Outcome: Adequate for Discharge Goal: Respiratory complications will improve Outcome: Adequate for Discharge Goal: Cardiovascular complication will be avoided Outcome: Adequate for Discharge   Problem: Activity: Goal: Risk for activity intolerance will decrease Outcome: Adequate for Discharge   Problem: Nutrition: Goal: Adequate nutrition will be maintained Outcome: Adequate for Discharge   Problem: Coping: Goal: Level of anxiety will decrease Outcome: Adequate for Discharge   Problem: Elimination: Goal: Will not experience complications related to bowel motility Outcome: Adequate for Discharge Goal: Will not experience complications related to urinary retention Outcome: Adequate for Discharge   Problem: Pain Managment: Goal: General experience of comfort will improve Outcome: Adequate for Discharge   Problem: Safety: Goal: Ability to remain free from injury will improve Outcome: Adequate for Discharge   Problem: Skin Integrity: Goal: Risk for impaired skin integrity will decrease Outcome: Adequate for Discharge

## 2023-07-01 NOTE — Progress Notes (Signed)
STROKE TEAM PROGRESS NOTE   BRIEF HPI [name] is a 82 y.o. female with history of hypertension, hyperlipidemia, previous alcohol abuse, vitamin D deficiency and hypothyroidism presenting with acute onset speech difficulty and right-sided weakness.  Patient states that most symptoms lasted about 45 minutes and then resolved, with some residual feelings that her right arm is feeling into her and does not feel the way he normally does.  MRI demonstrates multiple small strokes along left central sulcus.  SIGNIFICANT HOSPITAL EVENTS   INTERIM HISTORY/SUBJECTIVE No family is at the bedside. Pt neuro intact no complains. Pending LOOP recorder today before discharge. Pt enrolled into OCEANIC trial  OBJECTIVE  CBC    Component Value Date/Time   WBC 8.9 06/29/2023 1620   RBC 4.21 06/29/2023 1620   HGB 13.6 06/29/2023 1620   HGB 13.1 02/16/2023 1111   HCT 41.4 06/29/2023 1620   HCT 39.7 02/16/2023 1111   PLT 292 06/29/2023 1620   PLT 305 02/16/2023 1111   MCV 98.3 06/29/2023 1620   MCV 97 02/16/2023 1111   MCH 32.3 06/29/2023 1620   MCHC 32.9 06/29/2023 1620   RDW 12.8 06/29/2023 1620   RDW 10.9 (L) 02/16/2023 1111   LYMPHSABS 2.0 06/29/2023 1620   MONOABS 0.9 06/29/2023 1620   EOSABS 0.2 06/29/2023 1620   BASOSABS 0.1 06/29/2023 1620    BMET    Component Value Date/Time   NA 138 06/29/2023 1620   NA 138 02/16/2023 1111   K 3.9 06/29/2023 1620   CL 104 06/29/2023 1620   CO2 24 06/29/2023 1620   GLUCOSE 78 06/29/2023 1620   BUN 12 06/29/2023 1620   BUN 9 02/16/2023 1111   CREATININE 0.82 06/29/2023 1620   CREATININE 0.75 01/22/2017 1029   CALCIUM 9.3 06/29/2023 1620   EGFR 68 02/16/2023 1111   GFRNONAA >60 06/29/2023 1620   GFRNONAA 78 01/22/2017 1029    IMAGING past 24 hours No results found.  Vitals:   06/30/23 2351 07/01/23 0350 07/01/23 0838 07/01/23 1253  BP: (!) 130/57 138/69 (!) 152/74 111/61  Pulse: 67 (!) 57 (!) 59 71  Resp: 18 18 20 16   Temp:  99.8 F (37.7 C) 97.9 F (36.6 C) (!) 97.5 F (36.4 C) 98.5 F (36.9 C)  TempSrc: Oral Oral Oral Oral  SpO2: 98% 97% 96% 98%  Weight:      Height:         PHYSICAL EXAM General:  Alert, well-nourished, well-developed patient in no acute distress Psych:  Mood and affect appropriate for situation CV: Regular rate and rhythm on monitor Respiratory:  Regular, unlabored respirations on room air   NEURO:  Mental Status: AA&Ox3, patient is able to give clear and coherent history Speech/Language: speech is without dysarthria or aphasia.    Cranial Nerves:  II: PERRL. Visual fields full.  III, IV, VI: EOMI. Eyelids elevate symmetrically.  V: Sensation is intact to light touch and symmetrical to face.  VII: Face is symmetrical resting and smiling VIII: hearing intact to voice. IX, X: Phonation is normal.  ZO:[id number] shrug 5/5. XII: tongue is midline without fasciculations. Motor: 5/5 strength to all muscle groups tested.  Tone: is normal and bulk is normal Sensation- Intact to light touch bilaterally.  Coordination: FTN intact bilaterally.No drift.  Gait- deferred   ASSESSMENT/PLAN  Acute Ischemic Infarct:  left scattered infarcts along left central sulcus Etiology: Uncertain at this time, possibly cardioembolic CT head No acute abnormality.  Chronic right cerebellar infarct  CTA  head & neck Moderate to severe focal narrowing in an M3 segment of left MCA, chronically occluded distal right PICA  MRI multiple foci of infarction along the left central sulcus 2D Echo EF 65-70% Bilateral LE venous doppler negative for evidence of DVT Recommend loop recorder placement prior to discharge LDL 65 HgbA1c 5.6 VTE prophylaxis - Lovenox No antithrombotic prior to admission, now on aspirin 81 mg daily and clopidogrel 75 mg daily for 3 weeks and then aspirin alone.  Pt has enrolled into the [facility] trial Therapy recommendations:  none Disposition: Home  Hx of Stroke/TIA Patient has  chronic right cerebellar infarct seen on head CT, but has no history of this being diagnosed, asymptomatic  Hypertension Home meds: None Stable Long term BP goal normotensive  Hyperlipidemia Home meds: lipitor 40 LDL 65, goal < 70 On atorvastatin 80 mg daily Continue statin at discharge  Other Stroke Risk Factors Hx of alcohol use  Other Active Problems Hypothyroidism-continue home Synthroid  Hospital day # 0  Neurology will sign off. Please call with questions. Pt will follow up with stroke clinic NP at [facility] in about 4 weeks. Thanks for the consult.   Marvel Plan, MD PhD Stroke Neurology 07/01/2023 1:29 PM    To contact Stroke Continuity provider, please refer to [email or web address]. After hours, contact General Neurology

## 2023-07-01 NOTE — Discharge Instructions (Addendum)
Care After Your Loop Recorder     Monitor your cardiac device site for redness, swelling, and drainage. Call the device clinic at [phone] if you experience these symptoms or fever/chills.  If you notice bleeding from your site, hold firm, but gently pressure with two fingers for 5 minutes. Dried blood on the steri-strips when removing the outer bandage is normal.   Keep the large square bandage on your site for 24 hours and then you may remove it yourself. Keep the steri-strips underneath in place.   You may shower after 72 hours / 3 days from your procedure with the steri-strips in place. They will usually fall off on their own, or may be removed after 10 days. Pat dry.   Avoid lotions, ointments, or perfumes over your incision until it is well-healed.  Please do not submerge in water until your site is completely healed.   Your device is MRI compatible.   Remote monitoring is used to monitor your cardiac device from home. This monitoring is scheduled every month by our office. It allows [place] to keep an eye on the function of your device to ensure it is working properly.       Dear Amber Allen,   Thank you for letting us participate in your care! In this section, you will find a brief hospital admission summary of why you were admitted to the hospital, what happened during your admission, your diagnosis/diagnoses, and recommended follow up.  Primary diagnosis: Stroke Treatment plan: you were started on aspirin and plavix. Please also continue taking atorvastatin daily. Please follow the medication instructions. You have been setup for a loop recorder to monitor your heart rate for any abnormal rhythms.   POST-HOSPITAL & CARE INSTRUCTIONS We recommend following up with your PCP within 1 week from being discharged from the hospital. Please let PCP/Specialists know of any changes in medications that were made which you will be able to see in the medications section of this  packet.  DOCTOR'S APPOINTMENTS & FOLLOW UP Future Appointments  Date Time Provider Department Center  04/11/2024  8:30 AM [facility] ANNUAL WELLNESS VISIT [facility]   Thank you for choosing [facility]! Take care and be well!  Family Medicine Teaching Service Inpatient Team [place]  [facility]  [address] [place], [place] [address] [phone]

## 2023-07-01 NOTE — Progress Notes (Signed)
     Daily Progress Note Intern Pager: [phone]  Patient name: Amber Allen Medical record number: [id number] Date of birth: 1941/09/07 Age: 82 y.o. Gender: female  Primary Care Provider: Levin Erp, MD Consultants: neurology Code Status: full code   Assessment and Plan: Amber Allen is a 82 y.o. female presenting with word finding difficulty and transient facial droop, now resolved. Found to have acute CVA on MRI. Still having some R arm neglect and mild weakness. Pending cardiac workup to investigate for embolic source of stroke.  Pertinent PMH/PSH includes HTN, hypothyroidism, HLD.  Assessment & Plan Cerebrovascular accident (CVA) [facility]) Presented with word finding difficulty that began 8/20 at 12 PM. MRI with multiple foci of acute infarction along the left central sulcus, notably in the right hand motor cortex area, no acute hemorrhage or significant mass effect. Subjective right hand neglect and mild weakness on exam. S/p ASA 650, plavix 300 load. A1c 5.6. Embolic source suspected, echo showed normal EF and no intraatrial shunt, DVT study negative. No needs per PT/OT/SLP. -MedSurg, observation status -Appreciate ongoing neurology recommendations -Loop recorder placement today -Statin as below -Plavix 75 mg for 3 weeks -Aspirin 81mg  daily (long term) -Cardiac telemetry -Neurochecks every 4 hours  Hypothyroidism - continue synthroid - TSH (most recent 1.77) Hypercholesterolemia Pt was taking atorvastatin 40mg  daily at home. LDL 65, goal <70 per neuro - atorvastatin 80mg  daily   FEN/GI: heart healthy PPx: lovenox Dispo:pending clinical improvement  Subjective:  Patient states she is feeling much better this morning.  Was speaking with neurologist about enrolling in study. Reviewed normal echo and negative DVT study results and plan to get loop recorder placed today.  Patient denies right arm weakness/neglect today.  Feels ready to go home later  today.  Objective: Temp:  [97.9 F (36.6 C)-99.8 F (37.7 C)] 97.9 F (36.6 C) (08/22 0350) Pulse Rate:  [57-74] 57 (08/22 0350) Resp:  [15-18] 18 (08/22 0350) BP: (116-153)/(54-99) 138/69 (08/22 0350) SpO2:  [96 %-100 %] 97 % (08/22 0350) Physical Exam: General: Elderly female sitting in bed comfortably, in no acute distress Cardiovascular: Regular rate and rhythm, normal S1/S2, no murmurs, rubs, gallops Respiratory: Clear to auscultation bilaterally, no wheezes, rales, rhonchi Abdomen: Soft, nontender, nondistended, bowel sounds present Extremities: No drift noted with upper extremity extension, no swelling to bilateral lower extremities  Laboratory: Most recent CBC Lab Results  Component Value Date   WBC 8.9 06/29/2023   HGB 13.6 06/29/2023   HCT 41.4 06/29/2023   MCV 98.3 06/29/2023   PLT 292 06/29/2023   Most recent BMP    Latest Ref Rng & Units 06/29/2023    4:20 PM  BMP  Glucose 70 - 99 mg/dL 78   BUN 8 - 23 mg/dL 12   Creatinine 2.84 - 1.00 mg/dL 1.32   Sodium 440 - 102 mmol/L 138   Potassium 3.5 - 5.1 mmol/L 3.9   Chloride 98 - 111 mmol/L 104   CO2 22 - 32 mmol/L 24   Calcium 8.9 - 10.3 mg/dL 9.3     Lipid panel: cholesterol 127, TG 53, HDL 51, LDL 65  Imaging/Diagnostic Tests: none Lorayne Bender, MD 07/01/2023, 7:06 AM  PGY-1, [facility] [facility] FPTS Intern pager: [phone], text pages welcome Secure chat group [facility] Teaching Service

## 2023-07-01 NOTE — Assessment & Plan Note (Addendum)
Presented with word finding difficulty that began 8/20 at 12 PM. MRI with multiple foci of acute infarction along the left central sulcus, notably in the right hand motor cortex area, no acute hemorrhage or significant mass effect. Subjective right hand neglect and mild weakness on exam. S/p ASA 650, plavix 300 load. A1c 5.6. Embolic source suspected, echo showed normal EF and no intraatrial shunt, DVT study negative. No needs per PT/OT/SLP. -MedSurg, observation status -Appreciate ongoing neurology recommendations -Loop recorder placement today -Statin as below -Plavix 75 mg for 3 weeks -Aspirin 81mg  daily (long term) -Cardiac telemetry -Neurochecks every 4 hours

## 2023-07-01 NOTE — TOC Transition Note (Signed)
Transition of Care [facility]) - CM/SW Discharge Note   Patient Details  Name: Amber Allen MRN: [id number] Date of Birth: [birth year]  Transition of Care [facility]) CM/SW Contact:  Kermit Balo, RN Phone Number: 07/01/2023, 10:35 AM   Clinical Narrative:     Pt is discharging home with self care. No f/u per PT/OT.  Pt has transportation home.  Final next level of care: Home/Self Care Barriers to Discharge: No Barriers Identified   Patient Goals and CMS Choice      Discharge Placement                         Discharge Plan and Services Additional resources added to the After Visit Summary for     Discharge Planning Services: CM Consult                                 Social Determinants of Health (SDOH) Interventions SDOH Screenings   Food Insecurity: No Food Insecurity (06/30/2023)  Housing: Low Risk  (06/30/2023)  Transportation Needs: No Transportation Needs (06/30/2023)  Utilities: Not At Risk (06/30/2023)  Alcohol Screen: Low Risk  (03/12/2023)  Depression (PHQ2-9): Low Risk  (03/12/2023)  Financial Resource Strain: Low Risk  (03/12/2023)  Physical Activity: Inactive (03/12/2023)  Social Connections: Moderately Integrated (03/12/2023)  Stress: No Stress Concern Present (03/12/2023)  Tobacco Use: Low Risk  (06/30/2023)     Readmission Risk Interventions     No data to display

## 2023-07-01 NOTE — Plan of Care (Signed)
No neuro symptoms overnight and pt reports right arm/hand feeling normal. Ambulating in room independently with steady gait.   Problem: Education: Goal: Knowledge of disease or condition will improve Outcome: Progressing Goal: Knowledge of secondary prevention will improve (MUST DOCUMENT ALL) Outcome: Progressing Goal: Knowledge of patient specific risk factors will improve Loraine Leriche N/A or DELETE if not current risk factor) Outcome: Progressing   Problem: Self-Care: Goal: Ability to participate in self-care as condition permits will improve Outcome: Progressing Goal: Verbalization of feelings and concerns over difficulty with self-care will improve Outcome: Progressing Goal: Ability to communicate needs accurately will improve Outcome: Progressing   Problem: Clinical Measurements: Goal: Ability to maintain clinical measurements within normal limits will improve Outcome: Progressing Goal: Will remain free from infection Outcome: Progressing Goal: Diagnostic test results will improve Outcome: Progressing Goal: Respiratory complications will improve Outcome: Progressing Goal: Cardiovascular complication will be avoided Outcome: Progressing

## 2023-07-01 NOTE — Consult Note (Signed)
ELECTROPHYSIOLOGY CONSULT NOTE  Patient ID: Amber Allen MRN: [id number], DOB/AGE: December 25, 1940   Admit date: 06/29/2023 Date of Consult: 07/01/2023  Primary Physician: Levin Erp, MD Primary Cardiologist: Reason for Consultation: Cryptogenic stroke ; recommendations regarding Implantable Loop Recorder, requested by Dr. Roda Shutters  History of Present Illness Amber Allen was admitted on 06/29/2023 with speech difficulty and R sided weakness, found with acute CVA.    PMHx includes: HTN, HLD, hx of ETOH, hypothyroid, Vid D def  Neurology notes: left scattered infarcts along left central sulcus Etiology: Uncertain at this time, possibly cardioembolic.  she has undergone workup for stroke including echocardiogram and carotid angio.  The patient has been monitored on telemetry which has demonstrated sinus rhythm with only one brief PAT noted.    Neurology has deferred TEE  Echocardiogram this admission demonstrated   1. Left ventricular ejection fraction, by estimation, is 65 to 70%. The  left ventricle has normal function. The left ventricle has no regional  wall motion abnormalities. Left ventricular diastolic parameters are  consistent with Grade I diastolic  dysfunction (impaired relaxation).   2. Right ventricular systolic function is normal. The right ventricular  size is normal. Tricuspid regurgitation signal is inadequate for assessing  PA pressure.   3. The mitral valve is grossly normal. Trivial mitral valve  regurgitation. No evidence of mitral stenosis.   4. The aortic valve is tricuspid. Aortic valve regurgitation is not  visualized. Aortic valve sclerosis is present, with no evidence of aortic  valve stenosis.   5. The inferior vena cava is normal in size with greater than 50%  respiratory variability, suggesting right atrial pressure of 3 mmHg.   6. Agitated saline contrast bubble study was negative, with no evidence  of any interatrial shunt.    Lab work is  reviewed   Prior to admission, the patient denies chest pain, shortness of breath, dizziness, palpitations, or syncope.  They are recovering from their stroke with plans to home at discharge.   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82   Depression    DIVERTICULITIS, HX OF 06/27/2008   Diverticulosis    Hyperlipidemia    Hypertension    Substance abuse (HCC)    Alcohol    Thyroid disease    Unspecified vitamin D deficiency 12/12/2008     Surgical History:  Past Surgical History:  Procedure Laterality Date   APPENDECTOMY  82   BREAST BIOPSY  82   no malignancy   BREAST EXCISIONAL BIOPSY Left 82   TONSILLECTOMY     VAGINAL HYSTERECTOMY  82   including cervix     Medications Prior to Admission  Medication Sig Dispense Refill Last Dose   atorvastatin (LIPITOR) 40 MG tablet TAKE 1 TABLET EVERY DAY (Patient taking differently: Take 40 mg by mouth daily.) 90 tablet 3 06/29/2023   levothyroxine (SYNTHROID) 100 MCG tablet TAKE 1 TABLET EVERY DAY BEFORE BREAKFAST (Patient taking differently: Take 100 mcg by mouth daily before breakfast.) 30 tablet 0 06/29/2023   Multiple Vitamin (MULTIVITAMIN WITH MINERALS) TABS tablet Take 1 tablet by mouth daily.   Past Week   Multiple Vitamins-Minerals (PRESERVISION AREDS 2 PO) Take 1 tablet by mouth daily.   Past Week   aspirin EC 81 MG tablet Take 1 tablet (81 mg total) by mouth daily.  Swallow whole. (Patient not taking: Reported on 03/12/2023) 30 tablet 12 Not Taking    Inpatient Medications:   aspirin EC  81 mg Oral Daily   atorvastatin  80 mg Oral Daily   clopidogrel  75 mg Oral Daily   enoxaparin (LOVENOX) injection  40 mg Subcutaneous Q24H   levothyroxine  100 mcg Oral Q0600   OCEANIC-STROKE asundexian or placebo  1 tablet Oral Daily    Allergies:  Allergies  Allergen  Reactions   Brimonidine Tartrate Other (See Comments)   Pork-Derived Products Diarrhea    Describes diarrhea when ingesting pork product in addition to stating "but I can have bacon and ham."     Social History   Socioeconomic History   Marital status: Divorced    Spouse name: Not on file   Number of children: 2   Years of education: 20   Highest education level: Master's degree (e.g., MA, MS, MEng, MEd, MSW, MBA)  Occupational History   Occupation: Runner, broadcasting/film/video   Occupation: Retired  Tobacco Use   Smoking status: Never   Smokeless tobacco: Never  Vaping Use   Vaping status: Never Used  Substance and Sexual Activity   Alcohol use: Yes    Alcohol/week: 1.0 standard drink of alcohol    Types: 1 Glasses of wine per week    Comment: 1 botte per week   Drug use: No   Sexual activity: Not Currently  Other Topics Concern   Not on file  Social History Narrative   Lives by herself with cat here in [place].    Teaches fall prevention class soon. No recent falls.    Divorced with 2 kids (daughter in [place])   Not exercising regularly.    Retired from working with [employer]. 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    Social Determinants of Health   Financial Resource Strain: Low Risk  (03/12/2023)   Overall Financial Resource Strain (CARDIA)    Difficulty of Paying Living Expenses: Not hard at all  Food Insecurity: No Food Insecurity (06/30/2023)   Hunger Vital Sign    Worried About Running Out of Food in the Last Year: Never true    Ran Out of Food in the Last Year: Never true  Transportation Needs: No Transportation Needs (06/30/2023)   PRAPARE - Administrator, Civil Service (Medical): No    Lack of Transportation (Non-Medical): No  Physical Activity: Inactive (03/12/2023)   Exercise Vital Sign    Days of Exercise per Week: 0 days    Minutes of Exercise per Session: 0 min  Stress: No Stress Concern Present (03/12/2023)   [employer] of Occupational Health -  Occupational Stress Questionnaire    Feeling of Stress : Not at all  Social Connections: Moderately Integrated (03/12/2023)   Social Connection and Isolation Panel [NHANES]    Frequency of Communication with Friends and Family: More than three times a week    Frequency of Social Gatherings with Friends and Family: Once a week    Attends Religious Services: More than 4 times per year    Active Member of [employer] or Organizations: Yes    Attends Banker Meetings: More than 4 times per year    Marital Status: Divorced  Intimate Partner Violence: Not At Risk (06/30/2023)   Humiliation, Afraid, Rape, and Kick questionnaire    Fear of Current or Ex-Partner: No    Emotionally Abused: No    Physically Abused: No    Sexually Abused: No  Family History  Problem Relation Age of Onset   Hepatitis Brother        hep C   Hearing loss Brother    Arthritis Brother    Heart failure Father        MI-65   Hypertension Father    Alcoholism Father    Hyperlipidemia Father    Heart disease Father    Alcohol abuse Father    Stroke Father    Heart failure Mother        34   Alcoholism Mother    Heart disease Mother    Alcohol abuse Mother       Review of Systems: All other systems reviewed and are otherwise negative except as noted above.  Physical Exam: Vitals:   06/30/23 1953 06/30/23 2351 07/01/23 0350 07/01/23 0838  BP: (!) 142/54 (!) 130/57 138/69 (!) 152/74  Pulse: 66 67 (!) 57 (!) 59  Resp: 18 18 18 20   Temp: 98.4 F (36.9 C) 99.8 F (37.7 C) 97.9 F (36.6 C) (!) 97.5 F (36.4 C)  TempSrc: Oral Oral Oral Oral  SpO2: 96% 98% 97% 96%  Weight:      Height:        GEN- The patient is well appearing, alert and oriented x 3 today.   Head- normocephalic, atraumatic Eyes-  Sclera clear, conjunctiva pink Ears- hearing intact Oropharynx- clear Neck- supple Lungs- CTA b/l, normal work of breathing Heart- RRR, no murmurs, rubs or gallops  GI- soft, NT,  ND Extremities- no clubbing, cyanosis, or edema MS- no significant deformity or atrophy Skin- no rash or lesion Psych- euthymic mood, full affect   Labs:   Lab Results  Component Value Date   WBC 8.9 06/29/2023   HGB 13.6 06/29/2023   HCT 41.4 06/29/2023   MCV 98.3 06/29/2023   PLT 292 06/29/2023    Recent Labs  Lab 06/29/23 1620  NA 138  K 3.9  CL 104  CO2 24  BUN 12  CREATININE 0.82  CALCIUM 9.3  PROT 6.7  BILITOT 0.9  ALKPHOS 79  ALT 18  AST 23  GLUCOSE 78   No results found for: "CKTOTAL", "CKMB", "CKMBINDEX", "TROPONINI" Lab Results  Component Value Date   CHOL 127 06/30/2023   CHOL 273 (H) 04/16/2021   CHOL 201 (H) 07/12/2019   Lab Results  Component Value Date   HDL 51 06/30/2023   HDL 79 04/16/2021   HDL 74 07/12/2019   Lab Results  Component Value Date   LDLCALC 65 06/30/2023   LDLCALC 164 (H) 04/16/2021   LDLCALC 105 (H) 07/12/2019   Lab Results  Component Value Date   TRIG 53 06/30/2023   TRIG 171 (H) 04/16/2021   TRIG 125 07/12/2019   Lab Results  Component Value Date   CHOLHDL 2.5 06/30/2023   CHOLHDL 3.5 04/16/2021   CHOLHDL 2.7 07/12/2019   Lab Results  Component Value Date   LDLDIRECT 114 (H) 05/18/2011   LDLDIRECT 118 (H) 09/17/2010    No results found for: "DDIMER"   Radiology/Studies:  VAS [place] LOWER EXTREMITY VENOUS (DVT) Result Date: 06/30/2023  Lower Venous DVT Study Patient Name:  SANAIA PINILLA  Date of Exam:   06/30/2023 Medical Rec #: [id number]       Accession #:    [id number] Date of Birth: Aug 05, 1941       Patient Gender: F Patient Age:   82 years Exam Location:  [facility] Procedure:  VAS [place] LOWER EXTREMITY VENOUS (DVT) Referring Phys: Scheryl Marten XU --------------------------------------------------------------------------------  Indications: Stroke.  Comparison Study: No previous exams Performing Technologist: Jody Hill RVT, RDMS  Examination Guidelines: A complete evaluation includes B-mode imaging,  spectral Doppler, color Doppler, and power Doppler as needed of all accessible portions of each vessel. Bilateral testing is considered an integral part of a complete examination. Limited examinations for reoccurring indications may be performed as noted. The reflux portion of the exam is performed with the patient in reverse Trendelenburg.   Summary: BILATERAL: - No evidence of deep vein thrombosis seen in the lower extremities, bilaterally. -No evidence of popliteal cyst, bilaterally.   *See table(s) above for measurements and observations. Electronically signed by Heath Lark on 06/30/2023 at 8:54:53 PM.    Final       MR BRAIN WO CONTRAST Result Date: 06/29/2023 CLINICAL DATA:  Neuro deficit, acute, stroke suspected. EXAM: MRI HEAD WITHOUT CONTRAST TECHNIQUE: Multiplanar, multiecho pulse sequences of the brain and surrounding structures were obtained without intravenous contrast. COMPARISON:  Head CT and CTA head/neck 06/29/2023. FINDINGS: Brain: Multiple foci of acute infarction along the left central sulcus, notably in the right hand motor cortex area. No acute hemorrhage or significant mass effect. 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 No hydrocephalus or extra-axial collection. No mass effect or midline shift. Vascular: Normal flow voids. Skull and upper cervical spine: Normal marrow signal. Sinuses/Orbits: No acute findings. Other: None. IMPRESSION: 1. Multiple foci of acute infarction along the left central sulcus, notably in the right hand motor cortex area. No acute hemorrhage or significant mass effect. 2. 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 Electronically Signed   By: Orvan Falconer M.D.   On: 06/29/2023 18:43   CT HEAD WO CONTRAST Result Date: 06/29/2023 CLINICAL DATA:  Neuro deficit, acute, stroke suspected EXAM: CT HEAD WITHOUT CONTRAST CT ANGIOGRAPHY HEAD AND NECK TECHNIQUE: Contiguous axial images were obtained from the base of the skull through the  vertex without intravenous contrast. Multidetector CT imaging of the head and neck was performed using the standard protocol during bolus administration of intravenous contrast. Multiplanar CT image reconstructions and MIPs were obtained to evaluate the vascular anatomy. Carotid stenosis measurements (when applicable) are obtained utilizing NASCET criteria, using the distal internal carotid diameter as the denominator. RADIATION DOSE REDUCTION: This exam was performed according to the departmental dose-optimization program which includes automated exposure control, adjustment of the mA and/or kV according to patient size and/or use of iterative reconstruction technique. CONTRAST:  75mL OMNIPAQUE IOHEXOL 350 MG/ML SOLN COMPARISON:  CT Head 10/17/21 FINDINGS: CT HEAD FINDINGS Brain: Chronic appearing right cerebellar infarct. No hemorrhage. No hydrocephalus. No extra-axial fluid collection. No CT evidence of an acute cortical infarct. No mass effect. No mass lesion. Vascular: No hyperdense vessel or unexpected calcification. Skull: Normal. Negative for fracture or focal lesion. Sinuses/Orbits: Trace bilateral mastoid effusions. No middle ear effusion. Paranasal sinuses are clear. Bilateral lens replacement. Orbits are otherwise unremarkable. Other: None. Review of the MIP images confirms the above findings CTA NECK FINDINGS Aortic arch: Standard branching. Imaged portion shows no evidence of aneurysm or dissection. No significant stenosis of the major arch vessel origins. Right carotid system: No evidence of dissection, stenosis (50% or greater), or occlusion. Left carotid system: No evidence of dissection, stenosis (50% or greater), or occlusion. Vertebral arteries: Codominant. No evidence of dissection, stenosis (50% or greater), or occlusion. Skeleton: Negative. Other neck: Negative. Upper chest: Negative. Review of the MIP images confirms the above findings CTA  HEAD FINDINGS Anterior circulation: Moderate to severe  focal narrowing in an M3 segment of the left MCA (series 9, image 142). Otherwise no significant stenosis, proximal occlusion, aneurysm, or vascular malformation. Posterior circulation: The right PICA is chronically occluded distally. Otherwise no significant stenosis, proximal occlusion, aneurysm, or vascular malformation Venous sinuses: As permitted by contrast timing, patent. Anatomic variants: Fetal PCA on the left. Review of the MIP images confirms the above findings IMPRESSION: 1. No hemorrhage or CT evidence of an acute cortical infarct. Chronic appearing right cerebellar infarct. 2. Moderate to severe focal narrowing in an M3 segment of the left MCA. 3. Chronically occluded distal right PICA. 4. No intracranial large vessel occlusion. 5. No hemodynamically significant stenosis in the neck. Electronically Signed   By: Lorenza Cambridge M.D.   On: 06/29/2023 18:38     12-lead ECG SR All prior EKG's in EPIC reviewed with no documented atrial fibrillation  Telemetry SR, one very brief PAT  Assessment and Plan:  1. Cryptogenic stroke The patient presents with cryptogenic stroke. Dr. Lalla Brothers spoke at length with the patient about monitoring for afib with either a 30 day event monitor or an implantable loop recorder.  Risks, benefits, and alteratives to implantable loop recorder were discussed with the patient today. SHe had been given the information via neurology team ahead of time, and would like to proceed with implantable loop recorder.   Wound care was reviewed with the patient (keep incision clean and dry for 3 days).  Wound check follow up will be scheduled for the patient.  Please call with questions.   Shantel Helwig Norberto Sorenson, PA-C 07/01/2023

## 2023-07-01 NOTE — Research (Signed)
PATIENT: Amber Allen DOB: 19-Mar-1941  Amber Allen is a 82 y.o. female who meets inclusion and exclusion criteria and may be a potential candidate for OCEANIC study.  A brief description of the study's purpose and duration was provided to see if the patient was interested in participating. The patient was told that study participation is voluntary, and if she choose not to participate, she will continue receiving the same treatment as any other patient.   Since the patient seemed interested in the trial, a copy of the informed consent was provided to her at  11:30am yesterday and I said I would come back later to discuss the study in more detail after she had time to read the informed consent.  Detailed discussion  I returned at 9am today to discuss the study in detail.  I requested she ask any questions she may have as we reviewed the informed consent so I could answer them as we went along. I discussed the standard-of-care treatments, appropriate alternative treatments, procedures or devices that might be helpful to the patients, the clinical trial, and each option's relative risks and benefits and alternatives. We discussed the research participant's bill of rights. Then, we also reviewed the ICF page by page and discussed their responsibilities if they choose to participate, any compensation, and what medical treatment is available if injury occurs. Amber Allen was encouraged to discuss study participation with family, significant others, and primary care attending or physician to help reduce the possibility of undue influence by talking to the investigator alone.  The patient was reminded that the study participation is voluntary, and if she choose not to participate, she will continue receiving the same care as any other patient. Additionally, if she choose to participate and later decide she want to withdraw, she will continue to be treated as any other patient. Amber Allen was told that  she did not have to decide now.  Amber Allen answered questions appropriately to verbalize understanding of the informed consent.   The subject agreed to participate in the [facility] trial and signed the Research Participants Annette Stable of Rights and the informed consent at 11:50am today.  The informed consent was obtained prior to performance of any protocol-specific procedures for the subject.  A copy of the Research Participants Annette Stable of Rights and signed informed consent was given to the subject, and a copy placed in the subject's medical record.   Marvel Plan, MD PhD Stroke Neurology 07/01/2023 1:35 PM

## 2023-07-01 NOTE — Assessment & Plan Note (Signed)
Pt was taking atorvastatin 40mg  daily at home. - increase atorvastatin to 80mg  per neuro - lipid panel pending

## 2023-07-01 NOTE — Discharge Summary (Cosign Needed Addendum)
Family Medicine Teaching [facility] Discharge Summary  Patient name: Amber Allen Medical record number: [id number] Date of birth: 22-Sep-1941 Age: 82 y.o. Gender: female Date of Admission: 06/29/2023  Date of Discharge: 07/01/23 Admitting Physician: Para March, DO  Primary Care Provider: Levin Erp, MD Consultants: neurology  Indication for Hospitalization: speech difficulty  Discharge Diagnoses/Problem List:  Principal Problem for Admission: L MCA stroke Other Problems addressed during stay:  Principal Problem:   Cerebrovascular accident (CVA) [facility]) Active Problems:   Hypothyroidism   Hypercholesterolemia  Brief Hospital Course:  Amber Allen is a 82 y.o.female with a history of hypothyroidism, HLD, HTN (no meds) who was admitted to the [facility] Medicine Teaching Service at [facility] for speech difficulty. 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 MRI showed multiple foci of acute infarction along the left central sulcus, notably in the right hand motor cortex area, no acute hemorrhage or significant mass effect.  CTA was notable for moderate to severe focal narrowing in an M3 segment of the left MCA and chronically occluded distal right PICA. Pt was admitted to our service and started on aspirin 325mg , plavix 75mg , atorvastatin 80mg  daily. Cardioembolic source suspected, echo and DVT studies were normal.  Loop recorder was placed prior to discharge. Word finding difficulty resolved by second day of admission and R arm neglect/weakness had resolved by the date of discharge.  Per neuro, will continue Plavix 75mg  for 3 weeks and take aspirin 81 mg daily.   Hyperlipidemia Pt reported taking 40mg  atorvastatin at home. Dose was increased to 80mg  during admission. Decision was made to return to home dose of 40mg  on discharge based on lipid panel with LDL  65.  Other chronic conditions were medically managed with home medications and formulary alternatives as necessary (hypothyroidism)  PCP Follow-up Recommendations: Plavix ending around September 12 Continue aspirin 81 mg daily and atorvastatin 40 mg daily Loop recorder follow-up   Disposition: home  Discharge Condition: stable  Discharge Exam:  Vitals:   07/01/23 0350 07/01/23 0838  BP: 138/69 (!) 152/74  Pulse: (!) 57 (!) 59  Resp: 18 20  Temp: 97.9 F (36.6 C) (!) 97.5 F (36.4 C)  SpO2: 97% 96%   General: Elderly female sitting in bed comfortably, in no acute distress Cardiovascular: Regular rate and rhythm, normal S1/S2, no murmurs, rubs, gallops Respiratory: Clear to auscultation bilaterally, no wheezes, rales, rhonchi Abdomen: Soft, nontender, nondistended, bowel sounds present Extremities: No drift noted with upper extremity extension, no swelling to bilateral lower extremities  Significant Procedures: loop recorder placement 8/22  Significant Labs and Imaging:  Recent Labs  Lab 06/29/23 1620  WBC 8.9  HGB 13.6  HCT 41.4  PLT 292   Recent Labs  Lab 06/29/23 1620  NA 138  K 3.9  CL 104  CO2 24  GLUCOSE 78  BUN 12  CREATININE 0.82  CALCIUM 9.3  ALKPHOS 79  AST 23  ALT 18  ALBUMIN 3.7    CT head 8/20 1. No hemorrhage or CT evidence of an acute cortical infarct. Chronic appearing right cerebellar infarct. 2. Moderate to severe focal narrowing in an M3 segment of the left MCA. 3. Chronically occluded distal right PICA. 4. No intracranial large vessel occlusion. 5. No hemodynamically significant stenosis in the neck.  CT Angio head/neck 8/20 1. No hemorrhage or CT evidence of an acute cortical infarct. Chronic appearing right cerebellar infarct. 2. Moderate  to severe focal narrowing in an M3 segment of the left MCA. 3. Chronically occluded distal right PICA. 4. No intracranial large vessel occlusion. 5. No hemodynamically significant  stenosis in the neck.  MR brain 8/20 IMPRESSION: 1. Multiple foci of acute infarction along the left central sulcus, notably in the right hand motor cortex area. No acute hemorrhage or significant mass effect. 2. 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  Results/Tests Pending at Time of Discharge: none  Discharge Medications:  Allergies as of 07/01/2023       Reactions   Brimonidine Tartrate Other (See Comments)   Pork-derived Products Diarrhea   Describes diarrhea when ingesting pork product in addition to stating "but I can have bacon and ham."         Medication List     TAKE these medications    aspirin EC 81 MG tablet Take 1 tablet (81 mg total) by mouth daily. Swallow whole.   atorvastatin 40 MG tablet Commonly known as: LIPITOR TAKE 1 TABLET EVERY DAY What changed:  how much to take how to take this when to take this   clopidogrel 75 MG tablet Commonly known as: PLAVIX Take 1 tablet (75 mg total) by mouth daily for 18 days. Start taking on: July 02, 2023   levothyroxine 100 MCG tablet Commonly known as: SYNTHROID TAKE 1 TABLET EVERY DAY BEFORE BREAKFAST What changed:  how much to take how to take this when to take this   multivitamin with minerals Tabs tablet Take 1 tablet by mouth daily.   PRESERVISION AREDS 2 PO Take 1 tablet by mouth daily.        Discharge Instructions: Please refer to Patient Instructions section of EMR for full details.  Patient was counseled important signs and symptoms that should prompt return to medical care, changes in medications, dietary instructions, activity restrictions, and follow up appointments.   Follow-Up Appointments: Dr. Tiffany Kocher [facility]) on 07/06/23  Lorayne Bender, MD 07/01/2023, 11:05 AM PGY-1, [facility] Family [facility]  I was personally present and performed medical decision making activities of this service and have verified that the service and findings are accurately documented in the student's  note.  Shelby Mattocks, DO                  07/02/2023, 1:18 PM

## 2023-07-02 ENCOUNTER — Encounter (HOSPITAL_COMMUNITY): Payer: Self-pay | Admitting: Cardiology

## 2023-07-02 ENCOUNTER — Telehealth: Payer: Self-pay

## 2023-07-02 NOTE — Telephone Encounter (Signed)
  Loop Recorder Follow up   Is patient connected to Carelink/[employer]? Yes   Have steri-strips fallen off or been removed? No   Does the patient need in office follow up? No   Please continue to monitor your cardiac device site for redness, swelling, and drainage. Call the device clinic at [phone] if you experience these symptoms, fever/chills, or have questions about your device.   Remote monitoring is used to monitor your cardiac device from home. This monitoring is scheduled every month by our office. It allows us to keep an eye on the functioning of your device to ensure it is working properly.  

## 2023-07-02 NOTE — Telephone Encounter (Signed)
Attempted to contact patient about Post ILR Implant. No answer, LMTCB.   Melin monitor is UTD as of 07/02/23.

## 2023-07-02 NOTE — Telephone Encounter (Signed)
-----   Message from Sheilah Pigeon sent at 07/01/2023  3:40 PM EDT ----- Loop implant  [employer]  Cryptogenic stroke

## 2023-07-02 NOTE — Telephone Encounter (Signed)
Pt called back returning call.

## 2023-07-06 ENCOUNTER — Encounter: Payer: Self-pay | Admitting: Student

## 2023-07-06 ENCOUNTER — Ambulatory Visit (INDEPENDENT_AMBULATORY_CARE_PROVIDER_SITE_OTHER): Payer: PPO | Admitting: Student

## 2023-07-06 VITALS — BP 120/70 | HR 78 | Ht 65.0 in | Wt 164.0 lb

## 2023-07-06 DIAGNOSIS — I63412 Cerebral infarction due to embolism of left middle cerebral artery: Secondary | ICD-10-CM | POA: Diagnosis not present

## 2023-07-06 NOTE — Assessment & Plan Note (Addendum)
Asymptomatic, no residual deficits.  All questions were answered.  She will follow-up with neurology and cardiology, numbers provided for patient to follow-up.  Patient plans to participate in the [facility] trial, which is a study to test Asundexian for preventing a stroke caused by clot. - Continue Plavix, discontinuation date 9/12 - Continue aspirin 81 mg and Lipitor mg - Recommend follow-up with PCP after she has seen cardiology and neurology

## 2023-07-06 NOTE — Progress Notes (Signed)
    SUBJECTIVE:   CHIEF COMPLAINT / HPI:   Hospital follow-up Patient was discharged from the hospital on 07/01/2023 after having a CVA.  She initially presented with speech difficulty.  MRI showed multiple foci of acute infarction along the left central sulcus notably on the right hand motor cortex area, no acute hemorrhage or significant mass effect.  CTA was notable for moderate to severe focal narrowing and an M3 segment of the left MCA and chronically occluded distal right PICA.  She was seen by neurology and cardiology.  Per neurology she is on Plavix 75 mg for 3 weeks and will take aspirin 81 mg daily after.  Cardiology placed loop recorder.  Both neurology and cardiology wanted to follow-up with her outpatient, but she is unsure of her outpatient appointments.  She had complete resolution of her symptoms prior to discharge.  Today she has no symptoms.  She presents with her daughter who has multiple questions.  I answered their questions.  PERTINENT  PMH / PSH: Hypertension, hypothyroidism, hypercholesterolemia  OBJECTIVE:   BP 120/70   Pulse 78   Ht 5\' 5"  (1.651 m)   Wt 164 lb (74.4 kg)   SpO2 98%   BMI 27.29 kg/m    General: NAD, pleasant Cardio: RRR, no MRG. Respiratory: CTAB, normal wob on RA Skin: Warm and dry Neuro: CN II: PERRL CN III, IV,VI: EOMI CV V: Normal sensation in V1, V2, V3 CVII: Symmetric smile and brow raise CN VIII: Normal hearing CN IX,X: Symmetric palate raise  CN XI: 5/5 shoulder shrug CN XII: Symmetric tongue protrusion  UE and LE strength 5/5 Normal sensation in UE and LE bilaterally  No ataxia with finger to nose, normal heel to shin    ASSESSMENT/PLAN:   Cerebrovascular accident (CVA) (HCC) Asymptomatic, no residual deficits.  All questions were answered.  She will follow-up with neurology and cardiology, numbers provided for patient to follow-up.  Patient plans to participate in the [facility] trial, which is a study to test Asundexian for  preventing a stroke caused by clot. - Continue Plavix, discontinuation date 9/12 - Continue aspirin 81 mg and Lipitor mg - Recommend follow-up with PCP after she has seen cardiology and neurology   Tiffany Kocher, DO [facility]

## 2023-07-06 NOTE — Patient Instructions (Addendum)
It was great to see you! Thank you for allowing me to participate in your care!   I recommend that you always bring your medications to each appointment as this makes it easy to ensure we are on the correct medications and helps [place] not miss when refills are needed.  Our plans for today:  - Please call Neurology to make sure you have an appointment for September - Please call [facility] to make sure you have an appointment to remove your monitor in September - If you experience any similar symptoms as before, please go to Emergency Room.  You can use the mnemonic to look out for stroke symptoms Balance changes Eye sight changes Face droop Arm weaknes Speech trouble Time to call 911   Take care and seek immediate care sooner if you develop any concerns. Please remember to show up 15 minutes before your scheduled appointment time!  Tiffany Kocher, DO [facility]

## 2023-08-01 ENCOUNTER — Other Ambulatory Visit: Payer: Self-pay | Admitting: Student

## 2023-08-04 ENCOUNTER — Ambulatory Visit: Payer: PPO

## 2023-08-04 DIAGNOSIS — I63412 Cerebral infarction due to embolism of left middle cerebral artery: Secondary | ICD-10-CM | POA: Diagnosis not present

## 2023-08-06 LAB — CUP PACEART REMOTE DEVICE CHECK
Date Time Interrogation Session: 20240926080056
Implantable Pulse Generator Implant Date: 20240822
Pulse Gen Serial Number: 511044745

## 2023-08-20 NOTE — Progress Notes (Signed)
Carelink Summary Report / Loop Recorder 

## 2023-08-25 DIAGNOSIS — K225 Diverticulum of esophagus, acquired: Secondary | ICD-10-CM | POA: Diagnosis not present

## 2023-08-26 DIAGNOSIS — H52223 Regular astigmatism, bilateral: Secondary | ICD-10-CM | POA: Diagnosis not present

## 2023-08-26 DIAGNOSIS — Z961 Presence of intraocular lens: Secondary | ICD-10-CM | POA: Diagnosis not present

## 2023-08-26 DIAGNOSIS — H353132 Nonexudative age-related macular degeneration, bilateral, intermediate dry stage: Secondary | ICD-10-CM | POA: Diagnosis not present

## 2023-09-06 ENCOUNTER — Ambulatory Visit (INDEPENDENT_AMBULATORY_CARE_PROVIDER_SITE_OTHER): Payer: PPO

## 2023-09-06 DIAGNOSIS — I63412 Cerebral infarction due to embolism of left middle cerebral artery: Secondary | ICD-10-CM

## 2023-09-06 LAB — CUP PACEART REMOTE DEVICE CHECK
Date Time Interrogation Session: 20241028020556
Implantable Pulse Generator Implant Date: 20240822
Pulse Gen Serial Number: 511044745

## 2023-09-11 ENCOUNTER — Encounter: Payer: Self-pay | Admitting: Student

## 2023-09-13 ENCOUNTER — Telehealth: Payer: Self-pay

## 2023-09-13 NOTE — Telephone Encounter (Signed)
Alert received from [employer] for tachy event, narrow regular rhythm with ventricular ectopy, 4 beat run of NSVT. Event occurred 11/2 @ 13:48, duration 18sec, mean HR 222. Does appear to have noise at beginning of event.

## 2023-09-13 NOTE — Telephone Encounter (Signed)
Attempted to contact patient. No answer,LMTCB 

## 2023-09-15 NOTE — Telephone Encounter (Signed)
Spoke w/ patient. Asymptomatic at time of alert. Did notate that she had trouble sleeping later that evening.

## 2023-09-22 ENCOUNTER — Telehealth: Payer: Self-pay | Admitting: Student

## 2023-09-22 NOTE — Telephone Encounter (Signed)
Patient called stating she was contact about new medication that she was advised to start.  She wants to know what it is and why she needs to start it. Please advise.

## 2023-09-27 ENCOUNTER — Telehealth: Payer: Self-pay | Admitting: Student

## 2023-09-27 MED ORDER — METOPROLOL SUCCINATE ER 25 MG PO TB24: 25.0000 mg | ORAL_TABLET | Freq: Every day | ORAL | 1 refills | Status: DC

## 2023-09-27 NOTE — Telephone Encounter (Signed)
Patient states someone had discussed with her starting a new medication but it had not been called in for her.  Per Otilio Saber, PA-C (from phone note 09/13/23): 09/17/23 10:53 AM I dont think it's convincing enough for AF, but could use a little BB for NSVT/SVT.  Can we please start her on 25 mg of toprol at bedtime daily and make her follow up with me to re-assess and discuss?   Toprol XL 25mg  daily at bedtime [facility] in [place] per patient request.  Will forward message to scheduling team for them to reach out and schedule an appt for patient to F/U.

## 2023-09-27 NOTE — Progress Notes (Signed)
Merlin Loop Recorder  

## 2023-09-27 NOTE — Telephone Encounter (Signed)
Patient calling in about medication that suppose to be order for her. Please advise

## 2023-09-30 ENCOUNTER — Other Ambulatory Visit (HOSPITAL_COMMUNITY): Payer: Self-pay | Admitting: Neurology

## 2023-09-30 MED ORDER — STUDY - OCEANIC-STROKE - ASUNDEXIAN 50 MG OR PLACEBO TABLET (PI-SETHI): 1.0000 | ORAL_TABLET | Freq: Every day | ORAL | 0 refills | Status: DC

## 2023-10-08 LAB — CUP PACEART REMOTE DEVICE CHECK
Date Time Interrogation Session: 20241129080103
Implantable Pulse Generator Implant Date: 20240822
Pulse Gen Serial Number: 511044745

## 2023-10-11 ENCOUNTER — Ambulatory Visit (INDEPENDENT_AMBULATORY_CARE_PROVIDER_SITE_OTHER): Payer: PPO

## 2023-10-11 DIAGNOSIS — I63412 Cerebral infarction due to embolism of left middle cerebral artery: Secondary | ICD-10-CM

## 2023-11-09 NOTE — Progress Notes (Signed)
   Electrophysiology Office Note:   Date:  11/11/2023  ID:  LAQUETTA RACEY, DOB 24-Apr-1941, MRN [id number]  Primary Cardiologist: None Electrophysiologist: OLE ONEIDA HOLTS, MD      History of Present Illness:   Amber Allen is a 82 y.o. female with h/o cryptogenic stroke and tachycardia seen today for routine electrophysiology followup.   Since last being seen in our clinic the patient reports doing very well. Overall,  she denies chest pain, palpitations, dyspnea, PND, orthopnea, nausea, vomiting, dizziness, syncope, edema, weight gain, or early satiety.   Review of systems complete and found to be negative unless listed in HPI.   Device History: Abbott loop recorder implanted 07/01/2023 for Cryptogenic Stroke  Studies Reviewed:    EKG is not ordered today. EKG from 06/29/2023 reviewed which showed NSR at   80 bpm  Echo 06/2023 LVEF 65-70%, grade 1 DD    Physical Exam:   VS:  BP (!) 148/80   Pulse 78   Ht 5' 5 (1.651 m)   Wt 172 lb (78 kg)   SpO2 97%   BMI 28.62 kg/m    Wt Readings from Last 3 Encounters:  11/11/23 172 lb (78 kg)  07/06/23 164 lb (74.4 kg)  06/29/23 160 lb (72.6 kg)     GEN: Well nourished, well developed in no acute distress NECK: No JVD; No carotid bruits CARDIAC: Regular rate and rhythm, no murmurs, rubs, gallops RESPIRATORY:  Clear to auscultation without rales, wheezing or rhonchi  ABDOMEN: Soft, non-tender, non-distended EXTREMITIES:  No edema; No deformity   ILR Interrogation- reviewed in detail today,  See PACEART report  ASSESSMENT AND PLAN:    Cryptogenic Stroke s/p Abbott Loop recorder Normal device function See Pace Art report No changes today  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

## 2023-11-11 ENCOUNTER — Other Ambulatory Visit: Payer: Self-pay | Admitting: Student

## 2023-11-11 ENCOUNTER — Encounter: Payer: Self-pay | Admitting: Student

## 2023-11-11 ENCOUNTER — Ambulatory Visit: Payer: PPO | Attending: Student | Admitting: Student

## 2023-11-11 VITALS — BP 148/80 | HR 78 | Ht 65.0 in | Wt 172.0 lb

## 2023-11-11 DIAGNOSIS — I63412 Cerebral infarction due to embolism of left middle cerebral artery: Secondary | ICD-10-CM | POA: Diagnosis not present

## 2023-11-11 DIAGNOSIS — I10 Essential (primary) hypertension: Secondary | ICD-10-CM

## 2023-11-11 NOTE — Patient Instructions (Signed)
 Medication Instructions:  Your physician recommends that you continue on your current medications as directed. Please refer to the Current Medication list given to you today.  *If you need a refill on your cardiac medications before your next appointment, please call your pharmacy*   Lab Work: None ordered.  If you have labs (blood work) drawn today and your tests are completely normal, you will receive your results only by: MyChart Message (if you have MyChart) OR A paper copy in the mail If you have any lab test that is abnormal or we need to change your treatment, we will call you to review the results.   Testing/Procedures: None ordered.    Follow-Up: At [facility], you and your health needs are our priority.  As part of our continuing mission to provide you with exceptional heart care, we have created designated Provider Care Teams.  These Care Teams include your primary Cardiologist (physician) and Advanced Practice Providers (APPs -  Physician Assistants and Nurse Practitioners) who all work together to provide you with the care you need, when you need it.  We recommend signing up for the patient portal called MyChart.  Sign up information is provided on this After Visit Summary.  MyChart is used to connect with patients for Virtual Visits (Telemedicine).  Patients are able to view lab/test results, encounter notes, upcoming appointments, etc.  Non-urgent messages can be sent to your provider as well.   To learn more about what you can do with MyChart, go to [email or web address].    Your next appointment:   12 months with Jodie Passey, PA-C

## 2023-11-15 ENCOUNTER — Ambulatory Visit (INDEPENDENT_AMBULATORY_CARE_PROVIDER_SITE_OTHER): Payer: PPO

## 2023-11-15 DIAGNOSIS — I63412 Cerebral infarction due to embolism of left middle cerebral artery: Secondary | ICD-10-CM | POA: Diagnosis not present

## 2023-11-17 LAB — CUP PACEART REMOTE DEVICE CHECK
Date Time Interrogation Session: 20250106080057
Implantable Pulse Generator Implant Date: 20240822
Pulse Gen Serial Number: 511044745

## 2023-11-22 ENCOUNTER — Other Ambulatory Visit: Payer: Self-pay | Admitting: Otolaryngology

## 2023-11-22 ENCOUNTER — Telehealth: Payer: Self-pay | Admitting: *Deleted

## 2023-11-22 ENCOUNTER — Encounter: Payer: Self-pay | Admitting: Cardiology

## 2023-11-22 NOTE — Telephone Encounter (Signed)
   Pre-operative Risk Assessment    Patient Name: Amber Allen  DOB: 11/20/1940 MRN: [id number]   Date of last office visit: 11/11/2023 Date of next office visit: None Pt has device   Request for Surgical Clearance    Procedure:   Zenker's Diverticuleotomy endoscopic  Date of Surgery:  Clearance 12/15/23                                 Surgeon:  Dr. Vaughan Ricker Surgeon's Group or Practice Name:  [facility] Phone number:  [phone] Fax number:  [phone]   Type of Clearance Requested:   - Medical  - Pharmacy:  Hold Aspirin  Not Indicated   Type of Anesthesia:  Not Indicated   Additional requests/questions:    Signed, Edsel Grayce Sanders   11/22/2023, 3:12 PM

## 2023-11-22 NOTE — Telephone Encounter (Signed)
   Name: Amber Allen  DOB: 1941/05/01  MRN: [id number]  Primary Cardiologist: Lesia    Preoperative team, please contact this patient and set up a phone call appointment for further preoperative risk assessment. Please obtain consent and complete medication review. Thank you for your help.  I confirm that guidance regarding antiplatelet and oral anticoagulation therapy has been completed and, if necessary, noted below. Patient on ASA but no request to hold it for endoscopy.  I also confirmed the patient resides in the state of [place] . As per [facility] Medical Board telemedicine laws, the patient must reside in the state in which the provider is licensed.   Lamarr Satterfield, NP 11/22/2023, 4:34 PM [facility]

## 2023-11-22 NOTE — Progress Notes (Signed)
 PERIOPERATIVE PRESCRIPTION FOR IMPLANTED CARDIAC DEVICE PROGRAMMING   Patient Information:  Patient: Amber Allen  MRN: [id number]  Date of Birth: 1941-02-09    Surgeon:  Dr. Vaughan Ricker Surgeon's Group or Practice Name:  [facility] Phone number:  [phone] Fax number:  [phone] Planned Procedure:  Zenker's Diverticuleotomy endoscopic   Date of Procedure:  12/15/2023    Device Information:   Clinic EP Physician:   Dr. Ole Holts Device Type:  Loop Recorder Manufacturer and Phone #:  [employer]/Abbott: [phone] Pacemaker Dependent?:  N/A Date of Last Device Check:  11/22/2023        Normal Device Function?:  Yes     Electrophysiologist's Recommendations:   Have magnet available. Provide continuous ECG monitoring when magnet is used or reprogramming is to be performed.  Procedure should not interfere with device function.  No device programming or magnet placement needed.  Per Device Clinic Standing Orders, Amber Allen  11/22/2023 3:16 PM

## 2023-11-23 ENCOUNTER — Telehealth: Payer: Self-pay

## 2023-11-23 NOTE — Telephone Encounter (Signed)
 Called and spoke to patient scheduled telephone visit for preop clearance consent and med rec done

## 2023-11-23 NOTE — Telephone Encounter (Signed)
 Patient is scheduled for preop clearance telephone visit med rec and consent done     Patient Consent for Virtual Visit         Amber Allen has provided verbal consent on 11/23/2023 for a virtual visit (video or telephone).   CONSENT FOR VIRTUAL VISIT FOR:  Amber Allen  By participating in this virtual visit I agree to the following:  I hereby voluntarily request, consent and authorize [facility] and its employed or contracted physicians, physician assistants, nurse practitioners or other licensed health care professionals (the Practitioner), to provide me with telemedicine health care services (the "Services) as deemed necessary by the treating Practitioner. I acknowledge and consent to receive the Services by the Practitioner via telemedicine. I understand that the telemedicine visit will involve communicating with the Practitioner through live audiovisual communication technology and the disclosure of certain medical information by electronic transmission. I acknowledge that I have been given the opportunity to request an in-person assessment or other available alternative prior to the telemedicine visit and am voluntarily participating in the telemedicine visit.  I understand that I have the right to withhold or withdraw my consent to the use of telemedicine in the course of my care at any time, without affecting my right to future care or treatment, and that the Practitioner or I may terminate the telemedicine visit at any time. I understand that I have the right to inspect all information obtained and/or recorded in the course of the telemedicine visit and may receive copies of available information for a reasonable fee.  I understand that some of the potential risks of receiving the Services via telemedicine include:  Delay or interruption in medical evaluation due to technological equipment failure or disruption; Information transmitted may not be sufficient (e.g. poor  resolution of images) to allow for appropriate medical decision making by the Practitioner; and/or  In rare instances, security protocols could fail, causing a breach of personal health information.  Furthermore, I acknowledge that it is my responsibility to provide information about my medical history, conditions and care that is complete and accurate to the best of my ability. I acknowledge that Practitioner's advice, recommendations, and/or decision may be based on factors not within their control, such as incomplete or inaccurate data provided by me or distortions of diagnostic images or specimens that may result from electronic transmissions. I understand that the practice of medicine is not an exact science and that Practitioner makes no warranties or guarantees regarding treatment outcomes. I acknowledge that a copy of this consent can be made available to me via my patient portal [facility] MyChart), or I can request a printed copy by calling the office of [facility].    I understand that my insurance will be billed for this visit.   I have read or had this consent read to me. I understand the contents of this consent, which adequately explains the benefits and risks of the Services being provided via telemedicine.  I have been provided ample opportunity to ask questions regarding this consent and the Services and have had my questions answered to my satisfaction. I give my informed consent for the services to be provided through the use of telemedicine in my medical care

## 2023-11-23 NOTE — Telephone Encounter (Signed)
 Patient is scheduled for preop clearance telephone visit consent and med rec done

## 2023-11-29 ENCOUNTER — Ambulatory Visit: Payer: PPO | Attending: Cardiovascular Disease | Admitting: Emergency Medicine

## 2023-11-29 DIAGNOSIS — Z01810 Encounter for preprocedural cardiovascular examination: Secondary | ICD-10-CM

## 2023-11-29 NOTE — Progress Notes (Signed)
Virtual Visit via Telephone Note   Because of LABIBA TRYBA co-morbid illnesses, she is at least at moderate risk for complications without adequate follow up.  This format is felt to be most appropriate for this patient at this time.  The patient did not have access to video technology/had technical difficulties with video requiring transitioning to audio format only (telephone).  All issues noted in this document were discussed and addressed.  No physical exam could be performed with this format.  Please refer to the patient's chart for her consent to telehealth for Amber Allen.  Evaluation Performed:  Preoperative cardiovascular risk assessment _____________   Date:  11/29/2023   Patient ID:  Amber Allen, DOB 04/27/1941, MRN [id number] Patient Location:  Home Provider location:   Office  Primary Care Provider:  Levin Erp, MD Primary Cardiologist:  None  Chief Complaint / Patient Profile   83 y.o. y/o female with a h/o cryptogenic stroke and tachycardia who is pending Zenker's Diverticuleotomy on 12/15/2023 with Dr. Jenne Pane and presents today for telephonic preoperative cardiovascular risk assessment.  History of Present Illness    Amber Allen is a 83 y.o. female who presents via audio/video conferencing for a telehealth visit today.  Pt was last seen in cardiology clinic on 11/11/2023 by Maxine Glenn.  At that time Amber Allen was doing well.  The patient is now pending procedure as outlined above. Since her last visit, she denies chest pain, shortness of breath, lower extremity edema, fatigue, palpitations, melena, hematuria, hemoptysis, diaphoresis, weakness, presyncope, syncope, orthopnea, and PND.  Past Medical History    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09/09/2012   Depression     DIVERTICULITIS, HX OF 06/27/2008   Diverticulosis    Hyperlipidemia    Hypertension    Substance abuse (HCC)    Alcohol    Thyroid disease    Unspecified vitamin D deficiency 12/12/2008   Past Surgical History:  Procedure Laterality Date   APPENDECTOMY  1952   BREAST BIOPSY  1994   no malignancy   BREAST EXCISIONAL BIOPSY Left 1994   LOOP RECORDER INSERTION N/A 07/01/2023   Procedure: LOOP RECORDER INSERTION;  Surgeon: Lanier Prude, MD;  Location: [facility];  Service: Cardiovascular;  Laterality: N/A;   TONSILLECTOMY     VAGINAL HYSTERECTOMY  1974   including cervix    Allergies  Allergies  Allergen Reactions   Brimonidine Tartrate Other (See Comments)   Pork-Derived Products Diarrhea    Describes diarrhea when ingesting pork product in addition to stating "but I can have bacon and ham."     Home Medications    Prior to Admission medications   Medication Sig Start Date End Date Taking? Authorizing Provider  aspirin EC 81 MG tablet Take 1 tablet (81 mg total) by mouth daily. Swallow whole. Patient not taking: Reported on 03/12/2023 02/16/23   Levin Erp, MD  atorvastatin (LIPITOR) 40 MG tablet TAKE 1 TABLET EVERY DAY Patient taking differently: Take 40 mg by mouth daily. 05/06/23   Levin Erp, MD  levothyroxine (SYNTHROID) 100 MCG tablet TAKE 1 TABLET BY MOUTH DAILY BEFORE BREAKFAST. 11/11/23   Levin Erp, MD  metoprolol succinate (TOPROL-XL) 25 MG 24 hr tablet Take 1 tablet (25 mg total) by mouth at bedtime. 09/27/23   Tillery,  Mariam Dollar, PA-C  Multiple Vitamins-Minerals (PRESERVISION AREDS 2 PO) Take 1 tablet by mouth daily.    [provider]  Study - OCEANIC-STROKE - asundexian 50 mg or placebo tablet (PI-Sethi) Take 1 tablet (50 mg total) by mouth daily. 09/30/23   Micki Riley, MD    Physical Exam    Vital Signs:  Su Hoff Roher does not have vital signs available for review today.  Given telephonic nature of  communication, physical exam is limited. AAOx3. NAD. Normal affect.  Speech and respirations are unlabored.  Accessory Clinical Findings    None  Assessment & Plan    1.  Preoperative Cardiovascular Risk Assessment: According to the Revised Cardiac Risk Index (RCRI), her Perioperative Risk of Major Cardiac Event is (%): 0.9. Her Functional Capacity in METs is: 7.04 according to the Duke Activity Status Index (DASI).   Therefore, based on ACC/AHA guidelines, patient would be at acceptable risk for the planned procedure without further cardiovascular testing.  The patient was advised that if she develops new symptoms prior to surgery to contact our office to arrange for a follow-up visit, and she verbalized understanding.  Patient takes Aspirin 81mg  daily for history of stroke. This is not managed by cardiology. Recommendations for holding Aspirin prior to surgery should come from managing provider (PCP/Neurology).   A copy of this note will be routed to requesting surgeon.  Time:   Today, I have spent 8 minutes with the patient with telehealth technology discussing medical history, symptoms, and management plan.     Denyce Robert, NP  11/29/2023, 9:14 AM

## 2023-12-14 ENCOUNTER — Encounter (HOSPITAL_COMMUNITY): Payer: Self-pay | Admitting: Otolaryngology

## 2023-12-14 ENCOUNTER — Other Ambulatory Visit: Payer: Self-pay

## 2023-12-14 ENCOUNTER — Telehealth: Payer: Self-pay | Admitting: Neurology

## 2023-12-14 ENCOUNTER — Encounter (HOSPITAL_COMMUNITY): Payer: Self-pay | Admitting: Physician Assistant

## 2023-12-14 NOTE — Progress Notes (Signed)
 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 Seen by Lum Louis, NP on 11/29/23 for preop eval. Per note, According to the Revised Cardiac Risk Index (RCRI), her Perioperative Risk of Major Cardiac Event is (%): 0.9. Her Functional Capacity in METs is: 7.04 according to the Duke Activity Status Index (DASI). Therefore, based on ACC/AHA guidelines, patient would be at acceptable risk for the planned procedure without further cardiovascular testing. The patient was advised that if she develops new symptoms prior to surgery to contact our office to arrange for a follow-up visit, and she verbalized understanding. Patient takes Aspirin  81mg  daily for history of stroke. This is not managed by cardiology. Recommendations for holding Aspirin  prior to surgery should come from managing provider (PCP/Neurology).  Pt is enrolled in the OCEANIC study comparing factor XI inhibitor asundexein to placebo. This is managed by Neurology.  Per notes, she was cleared to hold the medication for 3 days prior to surgery.  Dr. Ilona office advised they would reach out to the patient to advise last dose to be taken 12/16/2023.  Other pertinent history includes hypothyroidism, EtOH abuse, PONV.  Patient will need day of surgery labs and evaluation.  EKG 06/29/2023: Sinus rhythm.  Rate 80.  TTE 06/30/2023:  1. Left ventricular ejection fraction, by estimation, is 65 to 70%. The  left ventricle has normal function. The left ventricle has no regional  wall motion abnormalities. Left ventricular diastolic parameters are  consistent with Grade I diastolic  dysfunction (impaired relaxation).   2. Right ventricular systolic function is normal. The right ventricular  size is normal. Tricuspid regurgitation signal is inadequate for assessing  PA pressure.   3. The  mitral valve is grossly normal. Trivial mitral valve  regurgitation. No evidence of mitral stenosis.   4. The aortic valve is tricuspid. Aortic valve regurgitation is not  visualized. Aortic valve sclerosis is present, with no evidence of aortic  valve stenosis.   5. The inferior vena cava is normal in size with greater than 50%  respiratory variability, suggesting right atrial pressure of 3 mmHg.   6. Agitated saline contrast bubble study was negative, with no evidence  of any interatrial shunt.      Lynwood Geofm RIGGERS [facility]/Anesthesiology Phone [phone] 12/17/2023 12:43 PM

## 2023-12-14 NOTE — Progress Notes (Signed)
 SDW call  Patient was given pre-op instructions over the phone. Patient verbalized understanding of instructions provided.     PCP - Dr. Wendel Lesch EP Cardiologist: Dr. Ole Holts Pulmonary:    PPM/ICD - Loop recorder Device Orders - na Rep Notified - na   Chest x-ray - na EKG -  06/29/2023 Stress Test - ECHO - 06/30/2023 Cardiac Cath -   Sleep Study/sleep apnea/CPAP: denies  Non-diabetic   Blood Thinner Instructions: Patient is in a research trial for either Asundexian or a placebo.  She did not make Dr. Carlie aware she is in this research trial.  Message left with his surgery scheduler, Lela Hummer re: directions as to this medication vs. Re-scheduling.  Aspirin  Instructions: continue    ERAS Protcol - Clears until 0745   Anesthesia review: Yes. HTN, stroke, research participant possibly on and anti-coag Asundexian   Patient denies shortness of breath, fever, cough and chest pain over the phone call  Your procedure is scheduled on Wednesday December 15, 2023  Report to [facility] Main Entrance A at  0815  A.M., then check in with the Admitting office.  Call this number if you have problems the morning of surgery:  [phone]   If you have any questions prior to your surgery date call [phone]: Open Monday-Friday 8am-4pm If you experience any cold or flu symptoms such as cough, fever, chills, shortness of breath, etc. between now and your scheduled surgery, please notify us  at the above number    Remember:  Do not eat after midnight the night before your surgery  You may drink clear liquids until   0745  the morning of your surgery.   Clear liquids allowed are: Water, Non-Citrus Juices (without pulp), Carbonated Beverages, Clear Tea, Black Coffee ONLY (NO MILK, CREAM OR POWDERED CREAMER of any kind), and Gatorade   Take these medicines the morning of surgery with A SIP OF WATER:  ASA, atorvastatin , levotyroxine  As of today, STOP taking any Aleve,  Naproxen, Ibuprofen, Motrin, Advil, Goody's, BC's, all herbal medications, fish oil, and all vitamins.

## 2023-12-14 NOTE — Progress Notes (Addendum)
 Patient is in a research trial for either Asundexian (an anti-coag) or a placebo.  She did not make Dr. Carlie aware she is in this research trial.  Message left with his surgery scheduler, Lela Hummer re: directions as to this medication vs. re-scheduling surgery. Message also left with [facility], Dr. Eather Popp for guidance. Lynwood Hope, PA-C also made aware.   Spoke with Lela Hummer at Dr. Carlie' office, if patient has not taken the Asundexian then surgery will continue.  I called the patient back and she had taken the medication for the day.  Re-spoke to Dr. Carlie' office, they will cancel the surgery and re-schedule.

## 2023-12-14 NOTE — Telephone Encounter (Signed)
 Preadmission Testing/Patsy Pt is having surgery and wanted to know what to do in regards to the anticoagulation medication Asundexian patient taking for research. Would like a call back on to do with this medication or do we need to reschedule surgery. Surgeon is Dr Vaughan Ricker. Can contact at [phone].

## 2023-12-14 NOTE — Telephone Encounter (Signed)
LVM for Patsy from Preadmission testing to call back to discuss  Asundexian

## 2023-12-15 NOTE — Telephone Encounter (Signed)
 Spoke to [place] from Preadmission testing Per Dr Godwin Lat   Hold the research medication x 3 days and restart when hemodynamically stable  Alexandria Ida expressed understanding and thanked me for calling

## 2023-12-15 NOTE — Progress Notes (Signed)
 RN Rexene spoke with Lorenza and Tobias and informed per [facility], patient may hold the research medication Asundexian for 3 day's prior to surgery.  Tasha from Dr. Carlie office will reach back out to the patient and inform her the last dose of Asundexian should be taken on 12/16/23

## 2023-12-17 NOTE — Progress Notes (Signed)
 PCP - Dr. Wendel Lesch  Cardiologist - Dr. Ole Holts   PPM/ICD - Loop Recorder  Chest x-ray - 10/05/17 EKG - 06/29/23 ECHO - 06/30/23  Blood Thinner Instructions: Stop Asundexian 3 days prior to procedure. Last dose 12/16/23 Aspirin  Instructions: Stopped 12/16/23  Anesthesia review: Y  Patient verbally denies any shortness of breath, fever, cough and chest pain during phone call   -------------  SDW INSTRUCTIONS given:  Your procedure is scheduled on Monday, February 10th.  Report to [facility] Main Entrance A at 0730 A.M., and check in at the Admitting office.  Call this number if you have problems the morning of surgery:  [phone]   Remember:  Do not eat after midnight the night before your surgery  You may drink clear liquids until 0700 the morning of your surgery.   Clear liquids allowed are: Water, Non-Citrus Juices (without pulp), Carbonated Beverages, Clear Tea, Black Coffee Only, and Gatorade    Take these medicines the morning of surgery with A SIP OF WATER  levothyroxine  (SYNTHROID )  atorvastatin  (LIPITOR )   As of today, STOP taking any Aspirin  (unless otherwise instructed by your surgeon) Aleve, Naproxen, Ibuprofen, Motrin, Advil, Goody's, BC's, all herbal medications, fish oil, and all vitamins.                      Do not wear jewelry, make up, or nail polish            Do not wear lotions, powders, perfumes/colognes, or deodorant.            Do not shave 48 hours prior to surgery.  Men may shave face and neck.            Do not bring valuables to the hospital.            [facility] is not responsible for any belongings or valuables.  Do NOT Smoke (Tobacco/Vaping) 24 hours prior to your procedure If you use a CPAP at night, you may bring all equipment for your overnight stay.   Contacts, glasses, dentures or bridgework may not be worn into surgery.      For patients admitted to the hospital, discharge time will be determined by your  treatment team.   Patients discharged the day of surgery will not be allowed to drive home, and someone needs to stay with them for 24 hours.    Special instructions:   [place]- Preparing For Surgery  Before surgery, you can play an important role. Because skin is not sterile, your skin needs to be as free of germs as possible. You can reduce the number of germs on your skin by washing with CHG (chlorahexidine gluconate) Soap before surgery.  CHG is an antiseptic cleaner which kills germs and bonds with the skin to continue killing germs even after washing.    Oral Hygiene is also important to reduce your risk of infection.  Remember - BRUSH YOUR TEETH THE MORNING OF SURGERY WITH YOUR REGULAR TOOTHPASTE  Please do not use if you have an allergy to CHG or antibacterial soaps. If your skin becomes reddened/irritated stop using the CHG.  Do not shave (including legs and underarms) for at least 48 hours prior to first CHG shower. It is OK to shave your face.  Please follow these instructions carefully.   Shower the NIGHT BEFORE SURGERY and the MORNING OF SURGERY with DIAL Soap.   Pat yourself dry with a CLEAN TOWEL.  Wear CLEAN PAJAMAS  to bed the night before surgery  Place CLEAN SHEETS on your bed the night of your first shower and DO NOT SLEEP WITH PETS.   Day of Surgery: Please shower morning of surgery  Wear Clean/Comfortable clothing the morning of surgery Do not apply any deodorants/lotions.   Remember to brush your teeth WITH YOUR REGULAR TOOTHPASTE.   Questions were answered. Patient verbalized understanding of instructions.

## 2023-12-17 NOTE — Anesthesia Preprocedure Evaluation (Addendum)
 Anesthesia Evaluation  Patient identified by MRN, date of birth, ID band Patient awake    Reviewed: Allergy & Precautions, NPO status , Patient's Chart, lab work & pertinent test results, reviewed documented beta blocker date and time   History of Anesthesia Complications (+) history of anesthetic complications (aspiration w/ last cscope)  Airway Mallampati: I  TM Distance: >3 FB Neck ROM: Full    Dental  (+) Teeth Intact, Dental Advisory Given   Pulmonary neg pulmonary ROS   Pulmonary exam normal breath sounds clear to auscultation       Cardiovascular hypertension (141/78 preop), Pt. on medications and Pt. 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  Echo 2021 1. Left ventricular ejection fraction, by estimation, is 65 to 70%. The  left ventricle has normal function. The left ventricle has no regional  wall motion abnormalities. There is mild left ventricular hypertrophy.  Left ventricular diastolic parameters  are consistent with Grade I diastolic dysfunction (impaired relaxation).   2. Right ventricular systolic function is normal. The right ventricular  size is normal.   3. The mitral valve is grossly normal. Trivial mitral valve  regurgitation.   4. The aortic valve is tricuspid. Aortic valve regurgitation is not  visualized. Mild aortic valve sclerosis is present, with no evidence of  aortic valve stenosis.   5. The inferior vena cava is normal in size with greater than 50%  respiratory variability, suggesting right atrial pressure of 3 mmHg.     Neuro/Psych  PSYCHIATRIC DISORDERS  Depression    enrolled in the OCEANIC study comparing factor XI inhibitor asundexein to placebo. This is managed by Neurology.  Per notes, she was cleared to hold the  medication for 3 days prior to surgery.  CVA    GI/Hepatic negative GI ROS,,,(+)       alcohol use  Endo/Other  Hypothyroidism    Renal/GU negative Renal ROS  negative genitourinary   Musculoskeletal negative musculoskeletal ROS (+)    Abdominal   Peds  Hematology negative hematology ROS (+)   Anesthesia Other Findings   Reproductive/Obstetrics negative OB ROS                             Anesthesia Physical Anesthesia Plan  ASA: 2  Anesthesia Plan: General   Post-op Pain Management: Tylenol  PO (pre-op)*   Induction: Intravenous and Rapid sequence  PONV Risk Score and Plan: 4 or greater and Ondansetron , Dexamethasone  and Treatment may vary due to age or medical condition  Airway Management Planned: Oral ETT  Additional Equipment: None  Intra-op Plan:   Post-operative Plan: Extubation in OR  Informed Consent: I have reviewed the patients History and Physical, chart, labs and discussed the procedure including the risks, benefits and alternatives for the proposed anesthesia with the patient or authorized representative who has indicated his/her understanding and acceptance.     Dental advisory given  Plan Discussed with: CRNA  Anesthesia Plan Comments:         Anesthesia Quick Evaluation

## 2023-12-17 NOTE — Progress Notes (Signed)
 Patient voiced understanding of new arrival time of 0600, clear liquids okay until 0530.

## 2023-12-20 ENCOUNTER — Encounter (HOSPITAL_COMMUNITY)
Admission: RE | Disposition: A | Discharge status: Home / Self Care | Payer: Self-pay | Source: Ambulatory Visit | Attending: Otolaryngology

## 2023-12-20 ENCOUNTER — Other Ambulatory Visit: Payer: Self-pay

## 2023-12-20 ENCOUNTER — Ambulatory Visit (INDEPENDENT_AMBULATORY_CARE_PROVIDER_SITE_OTHER): Payer: PPO

## 2023-12-20 ENCOUNTER — Observation Stay (HOSPITAL_COMMUNITY)
Admission: RE | Admit: 2023-12-20 | Discharge: 2023-12-21 | Disposition: A | Discharge status: Home / Self Care | Payer: No Typology Code available for payment source | Source: Ambulatory Visit | Attending: Otolaryngology | Admitting: Otolaryngology

## 2023-12-20 ENCOUNTER — Ambulatory Visit (HOSPITAL_BASED_OUTPATIENT_CLINIC_OR_DEPARTMENT_OTHER): Payer: Self-pay | Admitting: Physician Assistant

## 2023-12-20 ENCOUNTER — Encounter (HOSPITAL_COMMUNITY): Payer: Self-pay | Admitting: Otolaryngology

## 2023-12-20 ENCOUNTER — Ambulatory Visit (HOSPITAL_COMMUNITY): Payer: Self-pay | Admitting: Physician Assistant

## 2023-12-20 DIAGNOSIS — Z7982 Long term (current) use of aspirin: Secondary | ICD-10-CM | POA: Diagnosis not present

## 2023-12-20 DIAGNOSIS — I10 Essential (primary) hypertension: Secondary | ICD-10-CM | POA: Insufficient documentation

## 2023-12-20 DIAGNOSIS — Z79899 Other long term (current) drug therapy: Secondary | ICD-10-CM | POA: Insufficient documentation

## 2023-12-20 DIAGNOSIS — Z85828 Personal history of other malignant neoplasm of skin: Secondary | ICD-10-CM | POA: Diagnosis not present

## 2023-12-20 DIAGNOSIS — Z8673 Personal history of transient ischemic attack (TIA), and cerebral infarction without residual deficits: Secondary | ICD-10-CM | POA: Diagnosis not present

## 2023-12-20 DIAGNOSIS — I63412 Cerebral infarction due to embolism of left middle cerebral artery: Secondary | ICD-10-CM | POA: Diagnosis not present

## 2023-12-20 DIAGNOSIS — E039 Hypothyroidism, unspecified: Secondary | ICD-10-CM | POA: Insufficient documentation

## 2023-12-20 DIAGNOSIS — R1314 Dysphagia, pharyngoesophageal phase: Secondary | ICD-10-CM | POA: Diagnosis not present

## 2023-12-20 DIAGNOSIS — K225 Diverticulum of esophagus, acquired: Principal | ICD-10-CM | POA: Diagnosis present

## 2023-12-20 HISTORY — DX: Other complications of anesthesia, initial encounter: T88.59XA

## 2023-12-20 HISTORY — DX: Cardiac arrhythmia, unspecified: I49.9

## 2023-12-20 HISTORY — DX: Other specified postprocedural states: R11.2

## 2023-12-20 HISTORY — PX: ZENKER'S DIVERTICULECTOMY ENDOSCOPIC: SHX5224

## 2023-12-20 HISTORY — DX: Other specified postprocedural states: Z98.890

## 2023-12-20 HISTORY — DX: Cerebral infarction, unspecified: I63.9

## 2023-12-20 HISTORY — DX: Hypothyroidism, unspecified: E03.9

## 2023-12-20 LAB — POCT I-STAT, CHEM 8
BUN: 12 mg/dL (ref 8–23)
Calcium, Ion: 1.27 mmol/L (ref 1.15–1.40)
Chloride: 106 mmol/L (ref 98–111)
Creatinine, Ser: 0.8 mg/dL (ref 0.44–1.00)
Glucose, Bld: 92 mg/dL (ref 70–99)
HCT: 44 % (ref 36.0–46.0)
Hemoglobin: 15 g/dL (ref 12.0–15.0)
Potassium: 4.2 mmol/L (ref 3.5–5.1)
Sodium: 141 mmol/L (ref 135–145)
TCO2: 24 mmol/L (ref 22–32)

## 2023-12-20 SURGERY — ZENKER'S DIVERTICULECTOMY ENDOSCOPIC
Anesthesia: General

## 2023-12-20 MED ORDER — FENTANYL CITRATE (PF) 100 MCG/2ML IJ SOLN
INTRAMUSCULAR | Status: AC
  Filled 2023-12-20: qty 2

## 2023-12-20 MED ORDER — ACETAMINOPHEN 500 MG PO TABS
1000.0000 mg | ORAL_TABLET | Freq: Once | ORAL | Status: AC
  Administered 2023-12-20: 1000 mg via ORAL
  Filled 2023-12-20: qty 2

## 2023-12-20 MED ORDER — CHLORHEXIDINE GLUCONATE 0.12 % MT SOLN
15.0000 mL | Freq: Once | OROMUCOSAL | Status: AC
  Administered 2023-12-20: 15 mL via OROMUCOSAL
  Filled 2023-12-20: qty 15

## 2023-12-20 MED ORDER — DEXAMETHASONE SODIUM PHOSPHATE 10 MG/ML IJ SOLN
INTRAMUSCULAR | Status: AC
  Filled 2023-12-20: qty 1

## 2023-12-20 MED ORDER — PHENYLEPHRINE 80 MCG/ML (10ML) SYRINGE FOR IV PUSH (FOR BLOOD PRESSURE SUPPORT)
PREFILLED_SYRINGE | INTRAVENOUS | Status: AC
  Filled 2023-12-20: qty 10

## 2023-12-20 MED ORDER — DEXMEDETOMIDINE HCL IN NACL 80 MCG/20ML IV SOLN
INTRAVENOUS | Status: DC | PRN
  Administered 2023-12-20: 4 ug via INTRAVENOUS

## 2023-12-20 MED ORDER — SUCCINYLCHOLINE CHLORIDE 200 MG/10ML IV SOSY
PREFILLED_SYRINGE | INTRAVENOUS | Status: DC | PRN
  Administered 2023-12-20: 120 mg via INTRAVENOUS

## 2023-12-20 MED ORDER — AMISULPRIDE (ANTIEMETIC) 5 MG/2ML IV SOLN: 10.0000 mg | Freq: Once | INTRAVENOUS | Status: DC | PRN

## 2023-12-20 MED ORDER — PROPOFOL 10 MG/ML IV BOLUS
INTRAVENOUS | Status: AC
  Filled 2023-12-20: qty 20

## 2023-12-20 MED ORDER — ONDANSETRON HCL 4 MG/2ML IJ SOLN
INTRAMUSCULAR | Status: AC
  Filled 2023-12-20: qty 2

## 2023-12-20 MED ORDER — LEVOTHYROXINE SODIUM 100 MCG PO TABS
100.0000 ug | ORAL_TABLET | Freq: Every day | ORAL | Status: DC
  Administered 2023-12-21: 100 ug via ORAL
  Filled 2023-12-20: qty 1

## 2023-12-20 MED ORDER — ESMOLOL HCL 100 MG/10ML IV SOLN
INTRAVENOUS | Status: AC
  Filled 2023-12-20: qty 10

## 2023-12-20 MED ORDER — ORAL CARE MOUTH RINSE: 15.0000 mL | Freq: Once | OROMUCOSAL | Status: AC

## 2023-12-20 MED ORDER — ASPIRIN 81 MG PO TBEC
81.0000 mg | DELAYED_RELEASE_TABLET | Freq: Every day | ORAL | Status: DC
  Administered 2023-12-20: 81 mg via ORAL
  Filled 2023-12-20 (×2): qty 1

## 2023-12-20 MED ORDER — GLYCOPYRROLATE PF 0.2 MG/ML IJ SOSY
PREFILLED_SYRINGE | INTRAMUSCULAR | Status: DC | PRN
  Administered 2023-12-20: .2 mg via INTRAVENOUS

## 2023-12-20 MED ORDER — MORPHINE SULFATE (PF) 2 MG/ML IV SOLN: 2.0000 mg | INTRAVENOUS | Status: DC | PRN

## 2023-12-20 MED ORDER — 0.9 % SODIUM CHLORIDE (POUR BTL) OPTIME
TOPICAL | Status: DC | PRN
  Administered 2023-12-20: 1000 mL

## 2023-12-20 MED ORDER — EPINEPHRINE HCL (NASAL) 0.1 % NA SOLN
NASAL | Status: AC
  Filled 2023-12-20: qty 60

## 2023-12-20 MED ORDER — PROPOFOL 10 MG/ML IV BOLUS
INTRAVENOUS | Status: DC | PRN
  Administered 2023-12-20: 150 mg via INTRAVENOUS
  Administered 2023-12-20: 50 mg via INTRAVENOUS

## 2023-12-20 MED ORDER — ROCURONIUM BROMIDE 100 MG/10ML IV SOLN
INTRAVENOUS | Status: DC | PRN
  Administered 2023-12-20: 40 mg via INTRAVENOUS

## 2023-12-20 MED ORDER — ESMOLOL HCL 100 MG/10ML IV SOLN
INTRAVENOUS | Status: DC | PRN
  Administered 2023-12-20: 35 mg via INTRAVENOUS

## 2023-12-20 MED ORDER — DEXAMETHASONE SODIUM PHOSPHATE 10 MG/ML IJ SOLN
INTRAMUSCULAR | Status: DC | PRN
  Administered 2023-12-20: 10 mg via INTRAVENOUS

## 2023-12-20 MED ORDER — ONDANSETRON HCL 4 MG/2ML IJ SOLN: 4.0000 mg | INTRAMUSCULAR | Status: DC | PRN

## 2023-12-20 MED ORDER — ONDANSETRON HCL 4 MG PO TABS: 4.0000 mg | ORAL_TABLET | ORAL | Status: DC | PRN

## 2023-12-20 MED ORDER — PHENOL 1.4 % MT LIQD
1.0000 | OROMUCOSAL | Status: DC | PRN
  Administered 2023-12-20: 1 via OROMUCOSAL
  Filled 2023-12-20 (×2): qty 177

## 2023-12-20 MED ORDER — KCL IN DEXTROSE-NACL 20-5-0.45 MEQ/L-%-% IV SOLN
INTRAVENOUS | Status: DC
  Filled 2023-12-20: qty 1000

## 2023-12-20 MED ORDER — ATORVASTATIN CALCIUM 40 MG PO TABS
40.0000 mg | ORAL_TABLET | Freq: Every day | ORAL | Status: DC
  Filled 2023-12-20: qty 1

## 2023-12-20 MED ORDER — ONDANSETRON HCL 4 MG/2ML IJ SOLN
INTRAMUSCULAR | Status: DC | PRN
  Administered 2023-12-20: 4 mg via INTRAVENOUS

## 2023-12-20 MED ORDER — ACETAMINOPHEN 160 MG/5ML PO SOLN: 650.0000 mg | ORAL | Status: DC | PRN

## 2023-12-20 MED ORDER — FENTANYL CITRATE (PF) 250 MCG/5ML IJ SOLN
INTRAMUSCULAR | Status: DC | PRN
  Administered 2023-12-20 (×2): 50 ug via INTRAVENOUS

## 2023-12-20 MED ORDER — LACTATED RINGERS IV SOLN: INTRAVENOUS | Status: DC

## 2023-12-20 MED ORDER — OXYCODONE HCL 5 MG/5ML PO SOLN: 5.0000 mg | Freq: Once | ORAL | Status: DC | PRN

## 2023-12-20 MED ORDER — ONDANSETRON HCL 4 MG/2ML IJ SOLN: 4.0000 mg | Freq: Once | INTRAMUSCULAR | Status: DC | PRN

## 2023-12-20 MED ORDER — OXYCODONE HCL 5 MG PO TABS: 5.0000 mg | ORAL_TABLET | Freq: Once | ORAL | Status: DC | PRN

## 2023-12-20 MED ORDER — ACETAMINOPHEN 650 MG RE SUPP: 650.0000 mg | RECTAL | Status: DC | PRN

## 2023-12-20 MED ORDER — OXYMETAZOLINE HCL 0.05 % NA SOLN
NASAL | Status: AC
  Filled 2023-12-20: qty 30

## 2023-12-20 MED ORDER — LIDOCAINE 2% (20 MG/ML) 5 ML SYRINGE
INTRAMUSCULAR | Status: AC
  Filled 2023-12-20: qty 5

## 2023-12-20 MED ORDER — ROCURONIUM BROMIDE 10 MG/ML (PF) SYRINGE
PREFILLED_SYRINGE | INTRAVENOUS | Status: AC
  Filled 2023-12-20: qty 10

## 2023-12-20 MED ORDER — LIDOCAINE 2% (20 MG/ML) 5 ML SYRINGE
INTRAMUSCULAR | Status: DC | PRN
  Administered 2023-12-20: 60 mg via INTRAVENOUS

## 2023-12-20 MED ORDER — FENTANYL CITRATE (PF) 100 MCG/2ML IJ SOLN: 25.0000 ug | INTRAMUSCULAR | Status: DC | PRN

## 2023-12-20 MED ORDER — SUGAMMADEX SODIUM 200 MG/2ML IV SOLN
INTRAVENOUS | Status: DC | PRN
  Administered 2023-12-20: 200 mg via INTRAVENOUS

## 2023-12-20 MED ORDER — METOPROLOL SUCCINATE ER 25 MG PO TB24
25.0000 mg | ORAL_TABLET | Freq: Every day | ORAL | Status: DC
  Administered 2023-12-20: 25 mg via ORAL
  Filled 2023-12-20: qty 1

## 2023-12-20 SURGICAL SUPPLY — 35 items
BAG COUNTER SPONGE SURGICOUNT (BAG) ×1 IMPLANT
BALLN PULM 15 16.5 18X75 (BALLOONS) IMPLANT
BALLN PULMONARY 10-12 (MISCELLANEOUS) IMPLANT
BALLN PULMONARY 8-10 OD75 (MISCELLANEOUS) IMPLANT
BALLOON DILATION PUL 12-15 3CM (BALLOONS) IMPLANT
BALLOON PULM 15 16.5 18X75 (BALLOONS) IMPLANT
CATH ROBINSON RED A/P 18FR (CATHETERS) IMPLANT
COVER BACK TABLE 60X90IN (DRAPES) ×1 IMPLANT
COVER MAYO STAND STRL (DRAPES) ×1 IMPLANT
CUTTER FLEX LINEAR 45M (STAPLE) IMPLANT
DRAPE HALF SHEET 40X57 (DRAPES) ×1 IMPLANT
GAUZE 4X4 16PLY ~~LOC~~+RFID DBL (SPONGE) ×1 IMPLANT
GLOVE BIO SURGEON STRL SZ7.5 (GLOVE) ×1 IMPLANT
GUARD TEETH (MISCELLANEOUS) IMPLANT
KIT BASIN OR (CUSTOM PROCEDURE TRAY) ×1 IMPLANT
KIT TURNOVER KIT B (KITS) ×1 IMPLANT
NS IRRIG 1000ML POUR BTL (IV SOLUTION) ×1 IMPLANT
PAD ARMBOARD 7.5X6 YLW CONV (MISCELLANEOUS) ×2 IMPLANT
POSITIONER HEAD DONUT 9IN (MISCELLANEOUS) ×1 IMPLANT
RELOAD STAPLE 45 3.5 BLU ETS (ENDOMECHANICALS) ×1 IMPLANT
RELOAD STAPLE TA45 3.5 REG BLU (ENDOMECHANICALS) ×2 IMPLANT
SOL ANTI FOG 6CC (MISCELLANEOUS) ×1 IMPLANT
SPONGE NEURO XRAY DETECT 1X3 (DISPOSABLE) ×1 IMPLANT
SURGILUBE 2OZ TUBE FLIPTOP (MISCELLANEOUS) ×1 IMPLANT
SUT SILK 2 0 SH (SUTURE) IMPLANT
SUT SILK 3-0 RB1 30XBRD (SUTURE) ×1 IMPLANT
SUT VIC AB 2-0 UR6 27 (SUTURE) IMPLANT
SUT VIC AB 4-0 TF 27 (SUTURE) ×1 IMPLANT
SUTURE SILK 3-0 RB1 30XBRD (SUTURE) IMPLANT
SYR GAUGE ALLIANCE SINGLE USE (MISCELLANEOUS) IMPLANT
TOWEL GREEN STERILE FF (TOWEL DISPOSABLE) ×2 IMPLANT
TUBE CONNECTING 12X1/4 (SUCTIONS) ×1 IMPLANT
TUBE SALEM SUMP 10F (TUBING) IMPLANT
TUBE SALEM SUMP 12F (TUBING) IMPLANT
TUBE SALEM SUMP 14F (TUBING) IMPLANT

## 2023-12-20 NOTE — Plan of Care (Signed)
  Problem: Education: Goal: Knowledge of the prescribed therapeutic regimen will improve Outcome: Progressing   Problem: Activity: Goal: Ability to tolerate increased activity will improve Outcome: Progressing   Problem: Health Behavior/Discharge Planning: Goal: Identification of resources available to assist in meeting health care needs will improve Outcome: Progressing   Problem: Nutrition: Goal: Maintenance of adequate nutrition will improve Outcome: Progressing   Problem: Clinical Measurements: Goal: Complications related to the disease process, condition or treatment will be avoided or minimized Outcome: Progressing   Problem: Respiratory: Goal: Will regain and/or maintain adequate ventilation Outcome: Progressing   Problem: Education: Goal: Knowledge of General Education information will improve Description: Including pain rating scale, medication(s)/side effects and non-pharmacologic comfort measures Outcome: Progressing   Problem: Skin Integrity: Goal: Demonstration of wound healing without infection will improve Outcome: Progressing

## 2023-12-20 NOTE — Transfer of Care (Signed)
 Immediate Anesthesia Transfer of Care Note  Patient: Amber Allen  Procedure(s) Performed: Evlyn Hoffmann DIVERTICULECTOMY ENDOSCOPIC  Patient Location: PACU  Anesthesia Type:General  Level of Consciousness: awake and drowsy  Airway & Oxygen Therapy: Patient Spontanous Breathing  Post-op Assessment: Report given to RN, Post -op Vital signs reviewed and stable, and Patient moving all extremities  Post vital signs: Reviewed and stable  Last Vitals:  Vitals Value Taken Time  BP 130/98 12/20/23 0937  Temp 98.1   Pulse 76 12/20/23 0939  Resp 23 12/20/23 0939  SpO2 99 % 12/20/23 0939  Vitals shown include unfiled device data.  Last Pain:  Vitals:   12/20/23 0749  TempSrc: Oral  PainSc: 0-No pain      Patients Stated Pain Goal: 0 (12/20/23 0749)  Complications: No notable events documented.

## 2023-12-20 NOTE — Op Note (Signed)
 PREOPERATIVE DIAGNOSIS:  Zenker's diverticulum and pharyngoesophageal dysphagia   POSTOPERATIVE DIAGNOSIS:  Zenker's diverticulum and pharyngoesophageal dysphagia   PROCEDURE:  Endoscopic Zenker's diverticulotomy   SURGEON:  Virgina Grills, MD   ANESTHESIA:  General endotracheal anesthesia   COMPLICATIONS:  None.   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  She presents to the operating room for surgical management.   FINDINGS:  Small Zenker's diverticulum in typical location, exposure was difficult with the [facility] scope.   DESCRIPTION OF PROCEDURE:  The patient was identified in the holding room, informed consent having been obtained including discussion of risks, benefits and alternatives, the patient was brought to the operative suite and put the operative table in the supine position.  Anesthesia was induced and the patient was maintained via mask ventilation.  The eyes were taped closed and bed was turned 90 degrees from anesthesia.  The patient was given intravenous steroids during the  case.  A tooth guard was placed over the upper teeth and a Weerda bi-valved pharyngoscope was inserted and passed into the upper esophagus.  White pasty material was suctioned from the upper esophagus.  Exposure of the diverticulum was difficult.  A cervical and then standard rigid esophagoscopes were then inserted.  The native esophagus and diverticulum were well-visualized with the standard esophagoscope  An NG tube was placed down the esophagus through the esophagoscope and left in place as the esophagoscope was backed out.  The Ivonne Marry was reintroduced and the diverticulum was identified at the full extent of the ability to insert the scope.  The scope was suspended to the Mayo stand using a Lewy arm.  The operating microscope was brought into the field.  To aid in isolation of the common wall, 4-0  Vicryl sutures were placed to either side of midline through the common wall allowing superior traction on the wall.  A preoperative photo was made with the zero degree telescope.  The Endopath ETS flex 45 stapler was then inserted and closed and engaged on the common wall and then removed.  A second discharge of the staple gun was then performed to extend the incision a bit further.  The Weerda scope was then taken out of suspension and removed from the patient's mouth.  The tooth guard was removed.  She was turned back to anesthesia for wake-up and was extubated and moved to the recovery room in stable condition.

## 2023-12-20 NOTE — Anesthesia Postprocedure Evaluation (Signed)
 Anesthesia Post Note  Patient: Amber Allen  Procedure(s) Performed: Evlyn Hoffmann DIVERTICULECTOMY ENDOSCOPIC     Patient location during evaluation: PACU Anesthesia Type: General Level of consciousness: awake and alert, oriented and patient cooperative Pain management: pain level controlled Vital Signs Assessment: post-procedure vital signs reviewed and stable Respiratory status: spontaneous breathing, nonlabored ventilation and respiratory function stable Cardiovascular status: blood pressure returned to baseline and stable Postop Assessment: no apparent nausea or vomiting Anesthetic complications: no   No notable events documented.  Last Vitals:  Vitals:   12/20/23 0749 12/20/23 0945  BP:  137/80  Pulse:  72  Resp:  18  Temp: 36.7 C 36.7 C  SpO2:  91%    Last Pain:  Vitals:   12/20/23 0945  TempSrc:   PainSc: Asleep                 Jacquelyne Matte

## 2023-12-20 NOTE — H&P (Signed)
 Amber Allen is an 83 y.o. female.   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   Cerebellar infarction (HCC)    Cholecystolithiasis    Complication of anesthesia    De Quervain's tenosynovitis    De Quervain's tenosynovitis, left 09/09/2012   Depression    DIVERTICULITIS, HX OF 06/27/2008   Diverticulosis    Dysrhythmia    tachycardia   Hyperlipidemia    Hypertension    Hypothyroidism    PONV (postoperative nausea and vomiting)    aspiration after a colonoscopy-lead to PNA.   Stroke [facility])    Substance abuse (HCC)    Alcohol    Thyroid  disease    Unspecified vitamin D  deficiency 12/12/2008    Past Surgical History:  Procedure Laterality Date   APPENDECTOMY  1952   BREAST BIOPSY  1994   no malignancy   BREAST EXCISIONAL BIOPSY Left 1994   LOOP RECORDER INSERTION N/A 07/01/2023   Procedure: LOOP RECORDER INSERTION;  Surgeon: Boyce Byes, MD;  Location: [facility];  Service: Cardiovascular;  Laterality: N/A;   TONSILLECTOMY     VAGINAL HYSTERECTOMY  1974   including cervix    Family History  Problem Relation Age of Onset   Hepatitis Brother        hep C   Hearing loss Brother    Arthritis Brother    Heart failure Father        MI-65   Hypertension Father    Alcoholism Father    Hyperlipidemia Father    Heart disease Father    Alcohol abuse Father    Stroke Father    Heart failure Mother        35   Alcoholism Mother    Heart disease Mother    Alcohol abuse Mother    Social History:  reports that she has never smoked. She has never used smokeless tobacco. She reports current alcohol use of about 1.0 standard drink of alcohol per week. She reports that she does not use drugs.  Allergies:  Allergies  Allergen Reactions   Lumify [Brimonidine Tartrate] Other (See Comments)    Severe Eye Irritation     Pork-Derived Products Diarrhea    Describes diarrhea when ingesting pork product in addition to stating "but I can have bacon and ham."     Medications Prior to Admission  Medication Sig Dispense Refill   aspirin  EC 81 MG tablet Take 1 tablet (81 mg total) by mouth daily. Swallow whole. 30 tablet 12   atorvastatin  (LIPITOR ) 40 MG tablet TAKE 1 TABLET EVERY DAY 90 tablet 3   levothyroxine  (SYNTHROID ) 100 MCG tablet TAKE 1 TABLET BY MOUTH DAILY BEFORE BREAKFAST. 90 tablet 0   metoprolol  succinate (TOPROL -XL) 25 MG 24 hr tablet Take 1 tablet (25 mg total) by mouth at bedtime. 90 tablet 1   Multiple Vitamins-Minerals (PRESERVISION AREDS 2 PO) Take 1 tablet by mouth 2 (two) times daily.     Study - OCEANIC-STROKE - asundexian 50 mg or placebo tablet (PI-Sethi) Take 1 tablet (50 mg total) by mouth daily. 98 tablet 0    Results for orders placed or performed during the hospital encounter of 12/20/23 (from the past 48 hours)  I-STAT, chem 8     Status: None   Collection Time: 12/20/23  7:59 AM  Result Value Ref Range   Sodium  141 135 - 145 mmol/L   Potassium 4.2 3.5 - 5.1 mmol/L   Chloride 106 98 - 111 mmol/L   BUN 12 8 - 23 mg/dL   Creatinine, Ser 1.61 0.44 - 1.00 mg/dL   Glucose, Bld 92 70 - 99 mg/dL    Comment: Glucose reference range applies only to samples taken after fasting for at least 8 hours.   Calcium , Ion 1.27 1.15 - 1.40 mmol/L   TCO2 24 22 - 32 mmol/L   Hemoglobin 15.0 12.0 - 15.0 g/dL   HCT 09.6 04.5 - 40.9 %   No results found.  Review of Systems  All other systems reviewed and are negative.   Blood pressure (!) 141/78, pulse 79, temperature 98.1 F (36.7 C), temperature source Oral, resp. rate 18, height 5\' 5"  (1.651 m), weight 73.5 kg, SpO2 98%. Physical Exam Constitutional:      Appearance: Normal appearance.  HENT:     Head: Normocephalic and atraumatic.     Right Ear: External ear normal.     Left Ear: External ear normal.     Nose: Nose normal.      Mouth/Throat:     Mouth: Mucous membranes are moist.     Pharynx: Oropharynx is clear.  Eyes:     Extraocular Movements: Extraocular movements intact.     Pupils: Pupils are equal, round, and reactive to light.  Cardiovascular:     Rate and Rhythm: Normal rate.  Pulmonary:     Effort: Pulmonary effort is normal.  Musculoskeletal:     Cervical back: Normal range of motion.  Skin:    General: Skin is warm and dry.  Neurological:     General: No focal deficit present.     Mental Status: She is alert and oriented to person, place, and time.  Psychiatric:        Mood and Affect: Mood normal.        Behavior: Behavior normal.        Thought Content: Thought content normal.        Judgment: Judgment normal.      Assessment/Plan Zenker's diverticulum  To OR for endoscopic Zenker's diverticulotomy.  Virgina Grills, MD 12/20/2023, 8:16 AM

## 2023-12-20 NOTE — Anesthesia Procedure Notes (Signed)
 Procedure Name: Intubation Date/Time: 12/20/2023 8:44 AM  Performed by: Pasty Bongo, CRNAPre-anesthesia Checklist: Patient identified, Emergency Drugs available, Suction available and Patient being monitored Patient Re-evaluated:Patient Re-evaluated prior to induction Oxygen Delivery Method: Circle System Utilized Preoxygenation: Pre-oxygenation with 100% oxygen Induction Type: IV induction and Rapid sequence Laryngoscope Size: Mac and 4 Grade View: Grade I Tube type: Oral Tube size: 6.0 mm Number of attempts: 1 Airway Equipment and Method: Stylet and Oral airway Placement Confirmation: ETT inserted through vocal cords under direct vision, positive ETCO2 and breath sounds checked- equal and bilateral Secured at: 20 cm Tube secured with: Tape Dental Injury: Teeth and Oropharynx as per pre-operative assessment

## 2023-12-21 ENCOUNTER — Encounter (HOSPITAL_COMMUNITY): Payer: Self-pay | Admitting: Otolaryngology

## 2023-12-21 DIAGNOSIS — K225 Diverticulum of esophagus, acquired: Secondary | ICD-10-CM | POA: Diagnosis not present

## 2023-12-21 LAB — CUP PACEART REMOTE DEVICE CHECK
Date Time Interrogation Session: 20250210080037
Implantable Pulse Generator Implant Date: 20240822
Pulse Gen Serial Number: 511044745

## 2023-12-21 NOTE — Plan of Care (Signed)
  Problem: Clinical Measurements: Goal: Respiratory complications will improve Outcome: Progressing   Problem: Clinical Measurements: Goal: Cardiovascular complication will be avoided Outcome: Progressing   Problem: Activity: Goal: Risk for activity intolerance will decrease Outcome: Progressing   Problem: Nutrition: Goal: Adequate nutrition will be maintained Outcome: Progressing   Problem: Coping: Goal: Level of anxiety will decrease Outcome: Progressing   Problem: Elimination: Goal: Will not experience complications related to urinary retention Outcome: Progressing   Problem: Pain Managment: Goal: General experience of comfort will improve and/or be controlled Outcome: Progressing   Problem: Safety: Goal: Ability to remain free from injury will improve Outcome: Progressing

## 2023-12-21 NOTE — Plan of Care (Signed)
  Problem: Education: Goal: Knowledge of the prescribed therapeutic regimen will improve Outcome: Adequate for Discharge   Problem: Activity: Goal: Ability to tolerate increased activity will improve Outcome: Adequate for Discharge   Problem: Health Behavior/Discharge Planning: Goal: Identification of resources available to assist in meeting health care needs will improve Outcome: Adequate for Discharge   Problem: Nutrition: Goal: Maintenance of adequate nutrition will improve Outcome: Adequate for Discharge   Problem: Clinical Measurements: Goal: Complications related to the disease process, condition or treatment will be avoided or minimized Outcome: Adequate for Discharge   Problem: Respiratory: Goal: Will regain and/or maintain adequate ventilation Outcome: Adequate for Discharge   Problem: Skin Integrity: Goal: Demonstration of wound healing without infection will improve Outcome: Adequate for Discharge   Problem: Education: Goal: Knowledge of General Education information will improve Description: Including pain rating scale, medication(s)/side effects and non-pharmacologic comfort measures Outcome: Adequate for Discharge   Problem: Health Behavior/Discharge Planning: Goal: Ability to manage health-related needs will improve Outcome: Adequate for Discharge   Problem: Clinical Measurements: Goal: Ability to maintain clinical measurements within normal limits will improve Outcome: Adequate for Discharge Goal: Will remain free from infection Outcome: Adequate for Discharge Goal: Diagnostic test results will improve Outcome: Adequate for Discharge Goal: Respiratory complications will improve Outcome: Adequate for Discharge Goal: Cardiovascular complication will be avoided Outcome: Adequate for Discharge   Problem: Activity: Goal: Risk for activity intolerance will decrease Outcome: Adequate for Discharge   Problem: Nutrition: Goal: Adequate nutrition will be  maintained Outcome: Adequate for Discharge   Problem: Coping: Goal: Level of anxiety will decrease Outcome: Adequate for Discharge   Problem: Elimination: Goal: Will not experience complications related to bowel motility Outcome: Adequate for Discharge Goal: Will not experience complications related to urinary retention Outcome: Adequate for Discharge   Problem: Pain Managment: Goal: General experience of comfort will improve and/or be controlled Outcome: Adequate for Discharge   Problem: Safety: Goal: Ability to remain free from injury will improve Outcome: Adequate for Discharge   Problem: Skin Integrity: Goal: Risk for impaired skin integrity will decrease Outcome: Adequate for Discharge

## 2023-12-21 NOTE — Progress Notes (Signed)
RN entered room to dc patient. Pt's room was empty. RN called pt's daughter, Rinaldo Cloud, who stated pt was in the car with her. Pt stated she didn't feel like she needed dc paperwork because "nothing changed". RN reviewed AVS with pt and daughter over the phone- no questions voiced at this time. Primary RN removed IV prior to pt walking downstairs to lobby.

## 2023-12-21 NOTE — Discharge Summary (Signed)
Physician Discharge Summary  Patient ID: Amber Allen MRN: [id number] DOB/AGE: 11/20/1940 83 y.o.  Admit date: 12/20/2023 Discharge date: 12/21/2023  Admission Diagnoses: Dysphagia, Zenker's diverticulum  Discharge Diagnoses:  Principal Problem:   Zenker's diverticulum   Discharged Condition: good  Hospital Course: 83 year old female with dysphagia related to a Zenker's diverticulum presented for surgical management.  See operative note.  She was observed overnight and had no problems.  She is felt stable for discharge on POD 1.  Consults: None  Significant Diagnostic Studies: None  Treatments: surgery: Endoscopic Zenker's diverticulotomy  Discharge Exam: Blood pressure (!) 146/64, pulse 66, temperature 98 F (36.7 C), temperature source Oral, resp. rate 17, height 5\' 5"  (1.651 m), weight 73.5 kg, SpO2 98%. General appearance: alert, cooperative, and no distress  Disposition: Discharge disposition: 01-Home or Self Care       Discharge Instructions     Diet - low sodium heart healthy   Complete by: As directed    Discharge instructions   Complete by: As directed    Slowly advance diet through the week as discussed.  Tylenol and/or Motrin for pain.   Increase activity slowly   Complete by: As directed       Allergies as of 12/21/2023       Reactions   Lumify [brimonidine Tartrate] Other (See Comments)   Severe Eye Irritation    Pork-derived Products Diarrhea   Describes diarrhea when ingesting pork product in addition to stating "but I can have bacon and ham."         Medication List     TAKE these medications    aspirin EC 81 MG tablet Take 1 tablet (81 mg total) by mouth daily. Swallow whole.   atorvastatin 40 MG tablet Commonly known as: LIPITOR TAKE 1 TABLET EVERY DAY   levothyroxine 100 MCG tablet Commonly known as: SYNTHROID TAKE 1 TABLET BY MOUTH DAILY BEFORE BREAKFAST.   metoprolol succinate 25 MG 24 hr tablet Commonly known as:  TOPROL-XL Take 1 tablet (25 mg total) by mouth at bedtime.   OCEANIC-STROKE asundexian or placebo 50 mg tablet Take 1 tablet (50 mg total) by mouth daily.   PRESERVISION AREDS 2 PO Take 1 tablet by mouth 2 (two) times daily.        Follow-up Information     Christia Reading, MD. Schedule an appointment as soon as possible for a visit in 2 week(s).   Specialty: Otolaryngology Contact information: [address] [place] [address] [phone]                 Signed: Christia Reading 12/21/2023, 8:18 AM

## 2023-12-23 ENCOUNTER — Encounter: Payer: Self-pay | Admitting: Cardiology

## 2023-12-27 NOTE — Addendum Note (Signed)
 Addended by: Geralyn Flash D on: 12/27/2023 01:07 PM   Modules accepted: Orders

## 2023-12-27 NOTE — Progress Notes (Signed)
 [address] [employer]

## 2024-01-03 ENCOUNTER — Other Ambulatory Visit (HOSPITAL_COMMUNITY): Payer: Self-pay | Admitting: Pharmacist

## 2024-01-03 DIAGNOSIS — K225 Diverticulum of esophagus, acquired: Secondary | ICD-10-CM | POA: Diagnosis not present

## 2024-01-03 MED ORDER — STUDY - OCEANIC-STROKE - ASUNDEXIAN 50 MG OR PLACEBO TABLET (PI-SETHI): 1.0000 | ORAL_TABLET | Freq: Every day | ORAL | 0 refills | Status: AC

## 2024-01-24 ENCOUNTER — Ambulatory Visit (INDEPENDENT_AMBULATORY_CARE_PROVIDER_SITE_OTHER): Payer: PPO

## 2024-01-24 DIAGNOSIS — I63412 Cerebral infarction due to embolism of left middle cerebral artery: Secondary | ICD-10-CM | POA: Diagnosis not present

## 2024-01-25 LAB — CUP PACEART REMOTE DEVICE CHECK
Date Time Interrogation Session: 20250318130315
Implantable Pulse Generator Implant Date: 20240822
Pulse Gen Model: 5000
Pulse Gen Serial Number: 511044745

## 2024-01-27 NOTE — Addendum Note (Signed)
 Addended by: Geralyn Flash D on: 01/27/2024 01:31 PM   Modules accepted: Orders

## 2024-01-27 NOTE — Progress Notes (Signed)
 [address] [employer]

## 2024-01-30 ENCOUNTER — Encounter: Payer: Self-pay | Admitting: Cardiology

## 2024-02-18 ENCOUNTER — Other Ambulatory Visit: Payer: Self-pay

## 2024-02-18 MED ORDER — LEVOTHYROXINE SODIUM 100 MCG PO TABS: 100.0000 ug | ORAL_TABLET | Freq: Every day | ORAL | 0 refills | Status: AC

## 2024-02-22 DIAGNOSIS — Z961 Presence of intraocular lens: Secondary | ICD-10-CM | POA: Diagnosis not present

## 2024-02-22 DIAGNOSIS — H353132 Nonexudative age-related macular degeneration, bilateral, intermediate dry stage: Secondary | ICD-10-CM | POA: Diagnosis not present

## 2024-02-22 DIAGNOSIS — H52223 Regular astigmatism, bilateral: Secondary | ICD-10-CM | POA: Diagnosis not present

## 2024-02-28 ENCOUNTER — Ambulatory Visit (INDEPENDENT_AMBULATORY_CARE_PROVIDER_SITE_OTHER): Payer: PPO

## 2024-02-28 DIAGNOSIS — I63412 Cerebral infarction due to embolism of left middle cerebral artery: Secondary | ICD-10-CM

## 2024-02-29 ENCOUNTER — Encounter: Payer: Self-pay | Admitting: Cardiology

## 2024-02-29 LAB — CUP PACEART REMOTE DEVICE CHECK
Date Time Interrogation Session: 20250422130035
Implantable Pulse Generator Implant Date: 20240822
Pulse Gen Model: 5000
Pulse Gen Serial Number: 511044745

## 2024-03-14 NOTE — Addendum Note (Signed)
 Addended by: Edra Govern D on: 03/14/2024 01:41 PM   Modules accepted: Orders

## 2024-03-14 NOTE — Progress Notes (Signed)
 [address] [employer]

## 2024-03-16 ENCOUNTER — Ambulatory Visit: Payer: PPO

## 2024-03-16 VITALS — Ht 65.0 in | Wt 163.0 lb

## 2024-03-16 DIAGNOSIS — Z0000 Encounter for general adult medical examination without abnormal findings: Secondary | ICD-10-CM | POA: Diagnosis not present

## 2024-03-16 NOTE — Patient Instructions (Signed)
 [name] , Thank you for taking time to come for your Medicare Wellness Visit. I appreciate your ongoing commitment to your health goals. Please review the following plan we discussed and let me know if I can assist you in the future.   Referrals/Orders/Follow-Ups/Clinician Recommendations: Aim for 30 minutes of exercise or brisk walking 5 days per week.  Drink 6-8 glasses of water each day. Eat 5-6 servings of fresh vegetables and fruits per day. Continue to do brain exercises such as reading, puzzles, games on your phone to help keep the brain sharp and active.  This is a list of the screening recommended for you and due dates:  Health Maintenance  Topic Date Due   COVID-19 Vaccine (7 - Moderna risk 2024-25 season) 01/31/2024   Flu Shot  06/09/2024   Medicare Annual Wellness Visit  03/16/2025   DTaP/Tdap/Td vaccine (4 - Td or Tdap) 05/30/2026   Pneumonia Vaccine  Completed   DEXA scan (bone density measurement)  Completed   Zoster (Shingles) Vaccine  Completed   HPV Vaccine  Aged Out   Meningitis B Vaccine  Aged Out   Hepatitis C Screening  Discontinued    Advanced directives: (Copy Requested) Please bring a copy of your health care power of attorney and living will to the office to be added to your chart at your convenience. You can mail to [facility] [address] [place], [place] [address] or email to [email or web address]  Next Medicare Annual Wellness Visit scheduled for next year: Yes, 03/19/2025 at 2:20 p.m. via PHONE VISIT with Nurse Health Advisor  Have you seen your provider in the last 6 months (3 months if uncontrolled diabetes)? No

## 2024-03-16 NOTE — Progress Notes (Signed)
 Because this visit was a virtual/telehealth visit,  certain criteria was not obtained, such a blood pressure, CBG if applicable, and timed get up and go. Any medications not marked as "taking" were not mentioned during the medication reconciliation part of the visit. Any vitals not documented were not able to be obtained due to this being a telehealth visit or patient was unable to self-report a recent blood pressure reading due to a lack of equipment at home via telehealth. Vitals that have been documented are verbally provided by the patient.   Subjective:   Amber Allen is a 83 y.o. who presents for a Medicare Wellness preventive visit.  Visit Complete: Virtual I connected with  Amber Allen on 03/16/24 by a audio enabled telemedicine application and verified that I am speaking with the correct person using two identifiers.  Patient Location: Home  Provider Location: Office/Clinic  I discussed the limitations of evaluation and management by telemedicine. The patient expressed understanding and agreed to proceed.  Vital Signs: Because this visit was a virtual/telehealth visit, some criteria may be missing or patient reported. Any vitals not documented were not able to be obtained and vitals that have been documented are patient reported.  VideoDeclined- This patient declined Librarian, academic. Therefore the visit was completed with audio only.  Persons Participating in Visit: Patient.  AWV Questionnaire: No: Patient Medicare AWV questionnaire was not completed prior to this visit.  Cardiac Risk Factors include: advanced age (>39men, >41 women);dyslipidemia;family history of premature cardiovascular disease;hypertension     Objective:    Today's Vitals   03/16/24 0952  Weight: 163 lb (73.9 kg)  Height: 5\' 5"  (1.651 m)  PainSc: 0-No pain   Body mass index is 27.12 kg/m.     03/16/2024    9:53 AM 12/20/2023    8:06 AM 07/06/2023    3:09 PM  06/30/2023    3:03 PM 02/16/2023    8:39 AM 02/12/2022    9:18 AM 01/08/2022    9:31 AM  Advanced Directives  Does Patient Have a Medical Advance Directive? Yes Yes Yes Yes No No No  Type of Estate agent of [place];Living will Healthcare Power of [place];Living will Healthcare Power of [place];Living will Healthcare Power of Attorney     Does patient want to make changes to medical advance directive?  No - Patient declined No - Patient declined No - Patient declined     Copy of Healthcare Power of Attorney in Chart? No - copy requested Yes - validated most recent copy scanned in chart (See row information) Yes - validated most recent copy scanned in chart (See row information)      Would patient like information on creating a medical advance directive?     No - Patient declined No - Patient declined No - Patient declined    Current Medications (verified) Outpatient Encounter Medications as of 03/16/2024  Medication Sig   aspirin  EC 81 MG tablet Take 1 tablet (81 mg total) by mouth daily. Swallow whole.   atorvastatin  (LIPITOR ) 40 MG tablet TAKE 1 TABLET EVERY DAY   levothyroxine  (SYNTHROID ) 100 MCG tablet Take 1 tablet (100 mcg total) by mouth daily before breakfast.   metoprolol  succinate (TOPROL -XL) 25 MG 24 hr tablet Take 1 tablet (25 mg total) by mouth at bedtime.   Multiple Vitamins-Minerals (PRESERVISION AREDS 2 PO) Take 1 tablet by mouth 2 (two) times daily.   Study - OCEANIC-STROKE - asundexian 50 mg or placebo tablet (  PI-Sethi) Take 1 tablet (50 mg total) by mouth daily. For Investigational Use Only   No facility-administered encounter medications on file as of 03/16/2024.    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   Stroke [facility])    Substance abuse (HCC)    Alcohol    Thyroid  disease    Unspecified vitamin D  deficiency 12/12/2008   Past Surgical History:  Procedure Laterality Date   APPENDECTOMY  1952   BREAST BIOPSY  1994   no malignancy   BREAST EXCISIONAL BIOPSY Left 1994   LOOP RECORDER INSERTION N/A 07/01/2023   Procedure: LOOP RECORDER INSERTION;  Surgeon: Boyce Byes, MD;  Location: [facility] INVASIVE CV LAB;  Service: Cardiovascular;  Laterality: N/A;   TONSILLECTOMY     VAGINAL HYSTERECTOMY  1974   including cervix   ZENKER'S DIVERTICULECTOMY ENDOSCOPIC N/A 12/20/2023   Procedure: ZENKER'S DIVERTICULECTOMY ENDOSCOPIC;  Surgeon: Virgina Grills, MD;  Location: [facility] OR;  Service: ENT;  Laterality: N/A;   Family History  Problem Relation Age of Onset   Hepatitis Brother        hep C   Hearing loss Brother    Arthritis Brother    Heart failure Father        MI-65   Hypertension Father    Alcoholism Father    Hyperlipidemia Father    Heart disease Father    Alcohol abuse Father    Stroke Father    Heart failure Mother        25   Alcoholism Mother    Heart disease Mother    Alcohol abuse Mother    Social History   Socioeconomic History   Marital status: Single    Spouse name: Not on file   Number of children: 2   Years of education: 18   Highest education level: Master's degree (e.g., MA, MS, MEng, MEd, MSW, MBA)  Occupational History   Occupation: Runner, broadcasting/film/video   Occupation: Retired  Tobacco Use   Smoking status: Never   Smokeless tobacco: Never  Vaping Use   Vaping status: Never Used  Substance and Sexual Activity   Alcohol use: Yes    Alcohol/week: 1.0 standard drink of alcohol    Types: 1 Glasses  of wine per week    Comment: states "I drink a lot."   Drug use: No   Sexual activity: Not Currently  Other Topics Concern   Not on file  Social History Narrative   Lives by herself with cat here in [place]. Teaches fall prevention class soon. No recent falls. 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    Social Drivers of Corporate investment banker Strain: Low Risk  (03/16/2024)   Overall Financial Resource Strain (CARDIA)    Difficulty of Paying Living Expenses: Not hard at all  Food Insecurity: No Food Insecurity (03/16/2024)   Hunger Vital Sign  Worried About Programme researcher, broadcasting/film/video in the Last Year: Never true    Ran Out of Food in the Last Year: Never true  Transportation Needs: Unmet Transportation Needs (03/16/2024)   PRAPARE - Administrator, Civil Service (Medical): Yes    Lack of Transportation (Non-Medical): Yes  Physical Activity: Sufficiently Active (03/16/2024)   Exercise Vital Sign    Days of Exercise per Week: 5 days    Minutes of Exercise per Session: 30 min  Stress: No Stress Concern Present (03/16/2024)   [employer] of Occupational Health - Occupational Stress Questionnaire    Feeling of Stress : Not at all  Social Connections: Moderately Integrated (03/16/2024)   Social Connection and Isolation Panel [NHANES]    Frequency of Communication with Friends and Family: More than three times a week    Frequency of Social Gatherings with Friends and Family: Once a week    Attends Religious Services: More than 4 times per year    Active Member of [employer] or Organizations: Yes    Attends Engineer, structural: More than 4 times per year    Marital Status: Divorced    Tobacco Counseling Counseling given: Not Answered    Clinical Intake:  Pre-visit preparation completed: Yes  Pain : No/denies pain Pain Score: 0-No pain     BMI - recorded: 27.12 Nutritional  Status: BMI 25 -29 Overweight Nutritional Risks: None Diabetes: No  Lab Results  Component Value Date   HGBA1C 5.6 06/29/2023   HGBA1C 5.2 01/17/2016   HGBA1C 5.3 01/18/2015     How often do you need to have someone help you when you read instructions, pamphlets, or other written materials from your doctor or pharmacy?: 1 - Never  Interpreter Needed?: No  Information entered by :: Dermot Gremillion N. Alexsandra Shontz, LPN.   Activities of Daily Living     03/16/2024    9:56 AM 12/20/2023    7:48 AM  In your present state of health, do you have any difficulty performing the following activities:  Hearing? 0 0  Vision? 0 0  Comment  Reading Glasses  Difficulty concentrating or making decisions? 0 1  Comment BSE: REC CENTER EVERYDAY (HIDDEN PICTURES, PUZZLES, SOLITAIRE) Occasionally  Walking or climbing stairs? 0   Dressing or bathing? 0   Doing errands, shopping? 0 0  Preparing Food and eating ? N   Using the Toilet? N   In the past six months, have you accidently leaked urine? Y   Comment PROTECTION FOR LEAKAGE   Do you have problems with loss of bowel control? Y   Managing your Medications? N   Managing your Finances? N   Housekeeping or managing your Housekeeping? N     Patient Care Team: Genora Kidd, MD as PCP - General (Family Medicine) Boyce Byes, MD as PCP - Electrophysiology (Cardiology) Glory Larsen, MD as Consulting Physician (Dermatology) Alvis Jourdain, MD as Consulting Physician (Gastroenterology) Virgina Grills, MD as Consulting Physician (Otolaryngology) Gillie Lacy, OD as Consulting Physician (Optometry)  Indicate any recent Medical Services you may have received from other than [facility] providers in the past year (date may be approximate).     Assessment:    This is a routine wellness examination for [facility].  Hearing/Vision screen Hearing Screening - Comments:: Denies hearing difficulties.   Vision Screening - Comments:: Wears reading glasses - up  to date with routine eye exams with Dr. Sherrin Dollar     Goals Addressed  This Visit's Progress     Patient Stated (pt-stated)        5/8/20025: To weigh 160 pounds with clothes by July 2025.       Depression Screen     03/16/2024    9:59 AM 07/06/2023    3:08 PM 03/12/2023   10:33 AM 02/16/2023    8:39 AM 02/12/2022    9:19 AM 10/20/2021    1:49 PM 03/17/2021    9:08 AM  PHQ 2/9 Scores  PHQ - 2 Score 0 0 0 0 0 0 0  PHQ- 9 Score 2 0  0 2 0 0    Fall Risk     03/16/2024    9:55 AM 07/06/2023    3:08 PM 03/12/2023   10:33 AM 02/16/2023    8:39 AM 02/12/2022    9:19 AM  Fall Risk   Falls in the past year? 0 1 1 1 1   Number falls in past yr: 0 1 1 1  0  Injury with Fall? 0 1 1 0 1  Risk for fall due to : No Fall Risks History of fall(s) Impaired balance/gait;Impaired mobility History of fall(s)   Follow up Falls prevention discussed;Falls evaluation completed Falls evaluation completed Falls evaluation completed Falls evaluation completed;Falls prevention discussed     MEDICARE RISK AT HOME:  Medicare Risk at Home Any stairs in or around the home?: No If so, are there any without handrails?: No Home free of loose throw rugs in walkways, pet beds, electrical cords, etc?: Yes Adequate lighting in your home to reduce risk of falls?: Yes Life alert?: No Use of a cane, walker or w/c?: No Grab bars in the bathroom?: Yes Shower chair or bench in shower?: Yes Elevated toilet seat or a handicapped toilet?: Yes  TIMED UP AND GO:  Was the test performed?  No  Cognitive Function: 6CIT completed    03/16/2024   10:15 AM  MMSE - Mini Mental State Exam  Not completed: Unable to complete        03/16/2024   10:12 AM 03/12/2023   10:38 AM 06/19/2019   11:43 AM  6CIT Screen  What Year? 0 points 0 points 0 points  What month? 0 points 0 points 0 points  What time? 0 points 0 points 0 points  Count back from 20 0 points 0 points 0 points  Months in reverse 0 points 0 points 0  points  Repeat phrase 0 points 2 points 0 points  Total Score 0 points 2 points 0 points    Immunizations Immunization History  Administered Date(s) Administered   Influenza Split 09/09/2012   Influenza,inj,Quad PF,6+ Mos 07/10/2020   Influenza-Unspecified 08/03/2023   Moderna Sars-Covid-2 Vaccination 11/30/2019, 12/25/2019, 10/02/2020   PFIZER Comirnaty(Gray Top)Covid-19 Tri-Sucrose Vaccine 04/16/2021   Pfizer(Comirnaty)Fall Seasonal Vaccine 12 years and older 08/20/2022, 08/03/2023   Pneumococcal Conjugate-13 01/18/2015   Pneumococcal Polysaccharide-23 06/29/2007   RSV,unspecified 05/08/2023   Td 01/07/2005   Tdap 01/18/2015, 05/30/2016   Zoster Recombinant(Shingrix) 06/19/2021, 09/02/2021   Zoster, Live 12/07/2008    Screening Tests Health Maintenance  Topic Date Due   COVID-19 Vaccine (7 - Moderna risk 2024-25 season) 01/31/2024   INFLUENZA VACCINE  06/09/2024   Medicare Annual Wellness (AWV)  03/16/2025   DTaP/Tdap/Td (4 - Td or Tdap) 05/30/2026   Pneumonia Vaccine 27+ Years old  Completed   DEXA SCAN  Completed   Zoster Vaccines- Shingrix  Completed   HPV VACCINES  Aged Out   Meningococcal B Vaccine  Aged Out   Hepatitis C Screening  Discontinued    Health Maintenance  Health Maintenance Due  Topic Date Due   COVID-19 Vaccine (7 - Moderna risk 2024-25 season) 01/31/2024   Health Maintenance Items Addressed: Yes  Additional Screening:  Vision Screening: Recommended annual ophthalmology exams for early detection of glaucoma and other disorders of the eye.  Dental Screening: Recommended annual dental exams for proper oral hygiene  Community Resource Referral / Chronic Care Management: CRR required this visit?  No   CCM required this visit?  No     Plan:     I have personally reviewed and noted the following in the patient's chart:   Medical and social history Use of alcohol, tobacco or illicit drugs  Current medications and supplements including  opioid prescriptions. Patient is not currently taking opioid prescriptions. Functional ability and status Nutritional status Physical activity Advanced directives List of other physicians Hospitalizations, surgeries, and ER visits in previous 12 months Vitals Screenings to include cognitive, depression, and falls Referrals and appointments  In addition, I have reviewed and discussed with patient certain preventive protocols, quality metrics, and best practice recommendations. A written personalized care plan for preventive services as well as general preventive health recommendations were provided to patient.     Margette Sheldon, LPN   [birth year]19[birth year]   After Visit Summary: (MyChart) Due to this being a telephonic visit, the after visit summary with patients personalized plan was offered to patient via MyChart   Nurse Notes: Care gaps discussed with patient.  NCIR was verified and vaccinations updated in patient's chart.

## 2024-03-31 ENCOUNTER — Other Ambulatory Visit: Payer: Self-pay | Admitting: Student

## 2024-04-04 ENCOUNTER — Ambulatory Visit (INDEPENDENT_AMBULATORY_CARE_PROVIDER_SITE_OTHER): Payer: PPO

## 2024-04-04 DIAGNOSIS — I63412 Cerebral infarction due to embolism of left middle cerebral artery: Secondary | ICD-10-CM

## 2024-04-05 LAB — CUP PACEART REMOTE DEVICE CHECK
Date Time Interrogation Session: 20250528080338
Implantable Pulse Generator Implant Date: 20240822
Pulse Gen Model: 5000
Pulse Gen Serial Number: 511044745

## 2024-04-09 ENCOUNTER — Ambulatory Visit: Payer: Self-pay | Admitting: Cardiology

## 2024-04-10 ENCOUNTER — Ambulatory Visit: Admitting: Student

## 2024-04-18 ENCOUNTER — Encounter: Payer: Self-pay | Admitting: *Deleted

## 2024-04-19 NOTE — Addendum Note (Signed)
 Addended by: Edra Govern D on: 04/19/2024 11:57 AM   Modules accepted: Orders

## 2024-04-19 NOTE — Progress Notes (Signed)
 [address] [employer]

## 2024-05-08 ENCOUNTER — Ambulatory Visit: Payer: Self-pay

## 2024-05-08 DIAGNOSIS — I63412 Cerebral infarction due to embolism of left middle cerebral artery: Secondary | ICD-10-CM | POA: Diagnosis not present

## 2024-05-09 LAB — CUP PACEART REMOTE DEVICE CHECK
Date Time Interrogation Session: 20250630190029
Implantable Pulse Generator Implant Date: 20240822
Pulse Gen Model: 5000
Pulse Gen Serial Number: 511044745

## 2024-05-10 ENCOUNTER — Ambulatory Visit: Payer: Self-pay | Admitting: Cardiology

## 2024-05-10 ENCOUNTER — Other Ambulatory Visit: Payer: Self-pay | Admitting: Student

## 2024-05-15 DIAGNOSIS — I10 Essential (primary) hypertension: Secondary | ICD-10-CM | POA: Diagnosis not present

## 2024-05-15 DIAGNOSIS — E7801 Familial hypercholesterolemia: Secondary | ICD-10-CM | POA: Diagnosis not present

## 2024-05-15 DIAGNOSIS — E039 Hypothyroidism, unspecified: Secondary | ICD-10-CM | POA: Diagnosis not present

## 2024-05-26 NOTE — Addendum Note (Signed)
 Addended by: TAWNI DRILLING D on: 05/26/2024 02:04 PM   Modules accepted: Orders

## 2024-05-26 NOTE — Progress Notes (Signed)
Merlin loop Recorder 

## 2024-06-08 ENCOUNTER — Ambulatory Visit (INDEPENDENT_AMBULATORY_CARE_PROVIDER_SITE_OTHER): Payer: Self-pay

## 2024-06-08 DIAGNOSIS — I63412 Cerebral infarction due to embolism of left middle cerebral artery: Secondary | ICD-10-CM

## 2024-06-08 LAB — CUP PACEART REMOTE DEVICE CHECK
Date Time Interrogation Session: 20250731080045
Implantable Pulse Generator Implant Date: 20240822
Pulse Gen Model: 5000
Pulse Gen Serial Number: 511044745

## 2024-06-12 ENCOUNTER — Ambulatory Visit: Payer: Self-pay | Admitting: Cardiology

## 2024-07-10 ENCOUNTER — Ambulatory Visit (INDEPENDENT_AMBULATORY_CARE_PROVIDER_SITE_OTHER): Payer: Self-pay

## 2024-07-10 DIAGNOSIS — I63412 Cerebral infarction due to embolism of left middle cerebral artery: Secondary | ICD-10-CM

## 2024-07-12 LAB — CUP PACEART REMOTE DEVICE CHECK
Date Time Interrogation Session: 20250831090041
Implantable Pulse Generator Implant Date: 20240822
Pulse Gen Model: 5000
Pulse Gen Serial Number: 511044745

## 2024-07-15 ENCOUNTER — Ambulatory Visit: Payer: Self-pay | Admitting: Cardiology

## 2024-07-18 NOTE — Progress Notes (Signed)
 Remote Loop Recorder Transmission

## 2024-08-08 NOTE — Progress Notes (Signed)
 Remote Loop Recorder Transmission

## 2024-08-09 NOTE — Progress Notes (Signed)
 Remote Loop Recorder Transmission

## 2024-08-10 ENCOUNTER — Ambulatory Visit (INDEPENDENT_AMBULATORY_CARE_PROVIDER_SITE_OTHER): Payer: Self-pay

## 2024-08-10 DIAGNOSIS — I63412 Cerebral infarction due to embolism of left middle cerebral artery: Secondary | ICD-10-CM

## 2024-08-14 LAB — CUP PACEART REMOTE DEVICE CHECK
Date Time Interrogation Session: 20251003090332
Implantable Pulse Generator Implant Date: 20240822
Pulse Gen Model: 5000
Pulse Gen Serial Number: 511044745

## 2024-08-15 NOTE — Progress Notes (Signed)
 Remote Loop Recorder Transmission

## 2024-08-16 ENCOUNTER — Ambulatory Visit: Payer: Self-pay | Admitting: Cardiology

## 2024-08-24 DIAGNOSIS — G459 Transient cerebral ischemic attack, unspecified: Secondary | ICD-10-CM | POA: Diagnosis not present

## 2024-09-11 ENCOUNTER — Ambulatory Visit

## 2024-09-11 DIAGNOSIS — I63412 Cerebral infarction due to embolism of left middle cerebral artery: Secondary | ICD-10-CM | POA: Diagnosis not present

## 2024-09-12 ENCOUNTER — Ambulatory Visit: Payer: Self-pay | Admitting: Cardiology

## 2024-09-12 LAB — CUP PACEART REMOTE DEVICE CHECK
Date Time Interrogation Session: 20251103054913
Implantable Pulse Generator Implant Date: 20240822
Pulse Gen Model: 5000
Pulse Gen Serial Number: 511044745

## 2024-09-13 NOTE — Progress Notes (Signed)
 Remote Loop Recorder Transmission

## 2024-09-21 ENCOUNTER — Telehealth: Payer: Self-pay | Admitting: Cardiology
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
## 2024-09-21 NOTE — Telephone Encounter (Signed)
 Will forward to Dr Walt nurse and EP scheduling.
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
## 2024-10-12 ENCOUNTER — Ambulatory Visit: Attending: Cardiology

## 2024-10-12 DIAGNOSIS — I63412 Cerebral infarction due to embolism of left middle cerebral artery: Secondary | ICD-10-CM

## 2024-10-14 LAB — CUP PACEART REMOTE DEVICE CHECK
Date Time Interrogation Session: 20251204061504
Implantable Pulse Generator Implant Date: 20240822
Pulse Gen Model: 5000
Pulse Gen Serial Number: 511044745

## 2024-10-17 NOTE — Progress Notes (Signed)
 Remote Loop Recorder Transmission

## 2024-10-18 ENCOUNTER — Ambulatory Visit: Payer: Self-pay | Admitting: Cardiology

## 2024-11-03 NOTE — Telephone Encounter (Signed)
 Spoke w/ patient - she is scheduled with Dr. Inocencio in [place] on 2/16. She states d/t 2 strokes, she is unable to drive to [place].

## 2024-11-12 ENCOUNTER — Ambulatory Visit: Attending: Cardiology

## 2024-11-12 DIAGNOSIS — I63412 Cerebral infarction due to embolism of left middle cerebral artery: Secondary | ICD-10-CM

## 2024-11-13 LAB — CUP PACEART REMOTE DEVICE CHECK
Date Time Interrogation Session: 20260104055452
Implantable Pulse Generator Implant Date: 20240822
Pulse Gen Model: 5000
Pulse Gen Serial Number: 511044745

## 2024-11-14 ENCOUNTER — Ambulatory Visit: Payer: Self-pay | Admitting: Cardiology

## 2024-11-16 NOTE — Progress Notes (Signed)
 Remote Loop Recorder Transmission

## 2024-12-13 ENCOUNTER — Ambulatory Visit

## 2024-12-14 LAB — CUP PACEART REMOTE DEVICE CHECK
Date Time Interrogation Session: 20260204062540
Implantable Pulse Generator Implant Date: 20240822
Pulse Gen Model: 5000
Pulse Gen Serial Number: 511044745

## 2024-12-25 ENCOUNTER — Ambulatory Visit: Admitting: Cardiology

## 2025-01-13 ENCOUNTER — Ambulatory Visit

## 2025-02-13 ENCOUNTER — Ambulatory Visit

## 2025-03-16 ENCOUNTER — Ambulatory Visit

## 2025-03-19 ENCOUNTER — Ambulatory Visit

## 2025-03-19 ENCOUNTER — Encounter

## 2025-04-16 ENCOUNTER — Ambulatory Visit

## 2025-05-17 ENCOUNTER — Ambulatory Visit

## 2025-06-17 ENCOUNTER — Ambulatory Visit
